# Patient Record
Sex: Female | Born: 1945
Health system: Southern US, Community
[De-identification: ages and names within clinical notes are randomized; demographics above are authoritative.]

## PROBLEM LIST (undated history)

## (undated) DIAGNOSIS — I10 Essential (primary) hypertension: Secondary | ICD-10-CM

## (undated) DIAGNOSIS — J302 Other seasonal allergic rhinitis: Secondary | ICD-10-CM

## (undated) DIAGNOSIS — M792 Neuralgia and neuritis, unspecified: Secondary | ICD-10-CM

## (undated) DIAGNOSIS — H269 Unspecified cataract: Secondary | ICD-10-CM

## (undated) DIAGNOSIS — K219 Gastro-esophageal reflux disease without esophagitis: Secondary | ICD-10-CM

## (undated) DIAGNOSIS — E785 Hyperlipidemia, unspecified: Secondary | ICD-10-CM

## (undated) DIAGNOSIS — Z8639 Personal history of other endocrine, nutritional and metabolic disease: Secondary | ICD-10-CM

## (undated) HISTORY — PX: BREAST CYST EXCISION: SHX579

## (undated) HISTORY — PX: BREAST BIOPSY: SHX20

## (undated) HISTORY — PX: CHOLECYSTECTOMY: SHX55

## (undated) HISTORY — PX: TONSILLECTOMY: SUR1361

## (undated) HISTORY — DX: Unspecified cataract: H26.9

## (undated) HISTORY — PX: CATARACT EXTRACTION W/ INTRAOCULAR LENS & ANTERIOR VITRECTOMY: SHX1308

## (undated) HISTORY — DX: Personal history of other endocrine, nutritional and metabolic disease: Z86.39

## (undated) HISTORY — PX: ABDOMINAL HYSTERECTOMY: SHX81

## (undated) HISTORY — PX: LUMBAR DISC SURGERY: SHX700

## (undated) HISTORY — PX: CATARACT EXTRACTION W/ INTRAOCULAR LENS  IMPLANT, BILATERAL: SHX1307

---

## 1998-04-08 ENCOUNTER — Other Ambulatory Visit: Admission: RE | Admit: 1998-04-08 | Discharge: 1998-04-08 | Payer: Self-pay | Admitting: Obstetrics and Gynecology

## 2004-12-14 ENCOUNTER — Encounter: Admission: RE | Admit: 2004-12-14 | Discharge: 2004-12-14 | Payer: Self-pay | Admitting: General Surgery

## 2004-12-16 ENCOUNTER — Inpatient Hospital Stay (HOSPITAL_COMMUNITY): Admission: AD | Admit: 2004-12-16 | Discharge: 2004-12-19 | Payer: Self-pay | Admitting: General Surgery

## 2006-07-19 ENCOUNTER — Inpatient Hospital Stay (HOSPITAL_COMMUNITY): Admission: RE | Admit: 2006-07-19 | Discharge: 2006-07-23 | Payer: Self-pay | Admitting: Neurosurgery

## 2009-02-03 ENCOUNTER — Ambulatory Visit (HOSPITAL_COMMUNITY): Admission: RE | Admit: 2009-02-03 | Discharge: 2009-02-03 | Payer: Self-pay | Admitting: Neurosurgery

## 2010-09-25 NOTE — H&P (Signed)
NAMEJERRIS, Cynthia Adams NO.:  [id number]   MEDICAL RECORD NO.:  [id number]          PATIENT TYPE:  INP   LOCATION:  2899                         FACILITY:  [facility]   PHYSICIAN:  Hilda Lias, M.D.   DATE OF BIRTH:  [birth year]   DATE OF ADMISSION:  07/19/2006  DATE OF DISCHARGE:                              HISTORY & PHYSICAL   This patient is a lady who I have been following for more than two years  complaining of back pain radiating to the right leg associated with  numbness which is getting worse.  Patient had had conservative treatment  including medication and she is no better.  She had been seen on several  occasions by me and there no improvement and she is getting worse.  We  know by x-ray that she has spondylolisthesis at the level of L4 and L5  with degenerative disk disease and herniated disk at the L5-S1.  Because  of these findings she wants to proceed with surgery.   PAST MEDICAL HISTORY:  1. She had tonsillectomy.  2. C-section.   ALLERGIES:  She is allergic to SULFA.   SOCIAL HISTORY:  The patient does not smoke or drink.   FAMILY HISTORY:  Mother has had cancer of the breast.  Father had high  blood pressure.   REVIEW OF SYSTEMS:  Positive for high blood pressure, cholesterol and  cholecystectomy.   PHYSICAL EXAMINATION:  HEENT:  Ears, nose, and throat normal.  NECK:  Normal.  LUNGS:  Clear.  HEART SOUNDS:  Normal.  ABDOMEN:  Normal.  EXTREMITIES:  Normal.  NEURO:  She has weak dorsiflexion of both legs.  The straight leg  raising is positive about 45 degrees.  She has decreased flexibility  with numbness on the top of the foot.  The x-rays showed that she has  had herniated disk at the level of L4-5 with spondylolisthesis and  herniated disk and degenerative disk disease at the level of L5-S1.   CLINICAL IMPRESSION:  1. L4-5 spondylolisthesis.  2. Degenerative diseases L5-S1.   RECOMMENDATIONS:  The patient should be admitted for  surgery.  We are  going to proceed with our L4-L5 diskectomy fusion with pedicle screws  and during surgery we will make a decision about the L5-S1.  It might be  that  she might require only a foraminotomy and laminotomy and diskectomy or  most likely to involve the area of the fusion.  Patient knows all the  risks including infection, CSF leak, worsening pain, paralysis, need of  further surgery and damage to the vessels of the abdomen, CSF leak,  infection.           ______________________________  Hilda Lias, M.D.     EB/MEDQ  D:  07/19/2006  T:  07/19/2006  Job:  [id number]

## 2010-09-25 NOTE — Op Note (Signed)
Cynthia Adams, Cynthia Adams                ACCOUNT NO.:  [id number]   MEDICAL RECORD NO.:  [id number]          PATIENT TYPE:  INP   LOCATION:  3172                         FACILITY:  [facility]   PHYSICIAN:  Hilda Lias, M.D.   DATE OF BIRTH:  1945/10/22   DATE OF PROCEDURE:  07/19/2006  DATE OF DISCHARGE:                               OPERATIVE REPORT   PREOPERATIVE DIAGNOSES:  1. Degenerative disk disease, L4-5, L5-S1 with chronic herniated disk,      L4-5.  2. Chronic radiculopathy.   POSTPROCEDURE DIAGNOSES:  1. Degenerative disk disease, L4-5, L5-S1 with chronic herniated disk,      L4-5.  2. Chronic radiculopathy.  3. Also L4-L5 spondylolisthesis.   PROCEDURE:  1. Bilateral L5 laminectomy and facetectomy.  2. L-4 laminectomy and facetectomy.  3. Total L4-5 diskectomy with a lysis of adhesions.  4. L5-S1 diskectomy and foraminotomy.  5. Pedicle screws, L4-L5, S1.  6. Posterior arthrodesis, L4 to S1.  7. Cell Saver C-Arm.   SURGEON:  Hilda Lias, M.D.   BRIEF HISTORY:  Cynthia Adams is a lady who had been seen in my office for  the past two years complaining of back pain, mostly with radiation down  to the right leg, although also the left leg.  X-ray showed a severe  case of degenerative disc disease at L4-5, L5-S1 with herniated disk  which was calcified in the midline going to the right side.  Surgery was  advised and the risk was explained on history and physical.   PROCEDURE IN DETAIL:  The patient was taken to the OR and she was  positioned in a prone manner.  The skin was cleaned with DuraPrep.  A  midline incision from the upper part of L4 to L5 spine was made.  The  muscle was retracted all the way laterally.  Meatal was found on the L5-  S1 nerve root and was quite degenerative.  The L4 laminar facets were  completely loose and we proceeded with removal of the spinal process of  L5 with laminar of L5, as well as the facet.  At the level of L4 facet  procedure with  removal of the facet and lamina was accomplished.  We  investigated the L4-5 disc and, indeed, the patient has quite a bit of  adhesions mostly on the left side.  Lysis was accomplished and, indeed,  the patient had a quite calcified disk.  We entered disk space using the  microblade and slowly we were able to do a total broad diskectomy.  This  procedure was done bilaterally.  There were a lot of adhesions to the  point that we had to do more part of the pedicle to be able to get this  completely removed.  There was an area of adhesions mostly in the  anterior part of the thecal sac to the floor of the spine and there was  an area where we had some CSF leak which was taken care of with some  single stitches and Tisseel.  At the level of L5, we were able to do the  total diskectomy with removal of quite a bit degenerative disk.  Then 4  cages of 10 x 22 with a BMP of BNP and autograft site were inserted  bilaterally at 4-5, 5-1.  Using the C-Arm, first in the AP and lateral  views, we found the pedicle for L4-5, S1.  We were able to introduce 6  screws of 5.5 x 40.  These screws were kept in place using ROB with cap  in place.  Investigation of the foramina showed that the nerve roots  were completely intact.  Interestingly enough, the L5 nerve root was  small in relationship to the opposite, the right side.  Then, having  good solid area, we went laterally and we removed the periosteum of the  transverse process of L4-L5 and the ala of the sacral.  A mix of  autograft and BMP was used for the arthrodesis.  The area was irrigated.  Valsalva maneuver was negative.  The wounds were closed with Vicryl and  Steri-Strips.           ______________________________  Hilda Lias, M.D.     EB/MEDQ  D:  07/19/2006  T:  07/20/2006  Job:  [id number]

## 2010-09-25 NOTE — Discharge Summary (Signed)
Cynthia Adams, Cynthia Adams                ACCOUNT NO.:  [id number]   MEDICAL RECORD NO.:  [id number]          PATIENT TYPE:  INP   LOCATION:  9318                          FACILITY:  [facility]   PHYSICIAN:  Gita Kudo, M.D. DATE OF BIRTH:  06/23/1945   DATE OF ADMISSION:  12/16/2004  DATE OF DISCHARGE:  12/19/2004                                 DISCHARGE SUMMARY   PROGRESS NOTE   CHIEF COMPLAINT:  Gallstones.   HISTORY OF PRESENT ILLNESS:  This 58-year lady was brought to the [facility] on December 15, 2004, for elective cholecystectomy. She  had had bouts of abdominal pain but was not really complaining of severe  pain or problems. Her white count was modestly elevated. A laparoscopic  cholecystectomy was attempted but, because of her severe inflammation and  erythema, I felt it unsafe to proceed and therefore converted her to an open  procedure. This went well and the following morning, since she could not  stay there as an inpatient, she was transferred to [facility] for  postoperative care.   HOSPITAL COURSE:  At [facility], she was comfortable, active and had  no problems. She had a little fever and this gradually went away with good  pulmonary toilet. Her Jackson-Pratt drain was not producing much fluid. She  was maintained on Cefotan b.i.d.  She was ambulatory, tolerating a diet and  improving to the point that she could be discharged and was discharged on  December 19, 2004.  Her staples and drain was removed. She was instructed in  wound care and follow-up in the office.   LABORATORY STUDIES:  Pathology:  Acute gangrenous cholecystitis.  Follow-up  hemoglobin was 8.1. It had dropped as low as 7.9 on two occasions. She did  not get transfused. Her white count dropped down from 18,000 to 13,000.   She was given prescriptions for Vicodin as well as iron sulfate. She will be  followed in the office as an outpatient.   DISCHARGE DIAGNOSIS:  Gangrenous  cholecystitis.   OPERATIONS:  Open cholecystectomy - [facility], December 15, 2004.   COMPLICATIONS:  Postoperative anemia.   CONDITION AT DISCHARGE:  Good.       MRL/MEDQ  D:  12/19/2004  T:  12/19/2004  Job:  [id number]   cc:   Al Decant. Janey Greaser, MD  [address]  [place]  [place] [address]  Fax: [phone]

## 2014-02-07 LAB — HM MAMMOGRAPHY

## 2014-05-06 ENCOUNTER — Encounter: Payer: Self-pay | Admitting: *Deleted

## 2014-05-06 ENCOUNTER — Emergency Department (INDEPENDENT_AMBULATORY_CARE_PROVIDER_SITE_OTHER)
Admission: EM | Admit: 2014-05-06 | Discharge: 2014-05-06 | Disposition: A | Discharge status: Home / Self Care | Payer: Medicare Other | Source: Home / Self Care

## 2014-05-06 DIAGNOSIS — B029 Zoster without complications: Secondary | ICD-10-CM

## 2014-05-06 HISTORY — DX: Neuralgia and neuritis, unspecified: M79.2

## 2014-05-06 HISTORY — DX: Hyperlipidemia, unspecified: E78.5

## 2014-05-06 HISTORY — DX: Other seasonal allergic rhinitis: J30.2

## 2014-05-06 HISTORY — DX: Gastro-esophageal reflux disease without esophagitis: K21.9

## 2014-05-06 HISTORY — DX: Essential (primary) hypertension: I10

## 2014-05-06 MED ORDER — VALACYCLOVIR HCL 1 G PO TABS: 1000.0000 mg | ORAL_TABLET | Freq: Three times a day (TID) | ORAL | Status: DC

## 2014-05-06 MED ORDER — PREDNISONE 50 MG PO TABS: ORAL_TABLET | ORAL | Status: DC

## 2014-05-06 MED ORDER — METHYLPREDNISOLONE SODIUM SUCC 125 MG IJ SOLR
125.0000 mg | Freq: Once | INTRAMUSCULAR | Status: AC
  Administered 2014-05-06: 125 mg via INTRAMUSCULAR

## 2014-05-06 NOTE — ED Notes (Signed)
Pt c/o painful, itching rash on her RT upper back x 6 days.

## 2014-05-06 NOTE — Discharge Instructions (Signed)

## 2014-05-06 NOTE — ED Provider Notes (Signed)
CSN: [id number]     Arrival date & time 05/06/14  1252 History   None    Chief Complaint  Patient presents with  . Rash    HPI  HPI  This patient complains of a RASH  Location: R back/lateral rib cage   Onset: 2-3 days   Course: mild itching and erythema, now tingling and burning around dermatome radiating into R breast   Self-treated with: nothing   Improvement with treatment: n/a  History  Itching: yes  Tenderness: yes  New medications/antibiotics: no  Pet exposure: no  Recent travel or tropical exposure: no  New soaps, shampoos, detergent, clothing: no  Tick/insect exposure: no  Chemical Exposure: no  Red Flags  Feeling ill: no  Fever: no  Facial/tongue swelling/difficulty breathing: no  Diabetic or immunocompromised: no    Past Medical History  Diagnosis Date  . Hypertension   . Hyperlipidemia   . GERD (gastroesophageal reflux disease)   . Seasonal allergies   . Nerve pain    Past Surgical History  Procedure Laterality Date  . Tonsillectomy    . Abdominal hysterectomy    . Cesarean section    . Lumbar disc surgery     Family History  Problem Relation Age of Onset  . Cancer Mother     breast  . Dementia Mother   . Hypertension Mother   . Hyperlipidemia Mother    History  Substance Use Topics  . Smoking status: Never Smoker   . Smokeless tobacco: Not on file  . Alcohol Use: No   OB History    No data available     Review of Systems  All other systems reviewed and are negative.   Allergies  Minocycline and Sulfa antibiotics  Home Medications   Prior to Admission medications   Medication Sig Start Date End Date Taking? Authorizing Provider  aspirin 81 MG tablet Take 81 mg by mouth daily.   Yes Historical Provider, MD  Cyanocobalamin (B-12 PO) Take by mouth.   Yes Historical Provider, MD  estradiol (CLIMARA - DOSED IN MG/24 HR) 0.05 mg/24hr patch Place 0.05 mg onto the skin once a week.   Yes Historical Provider, MD  fluticasone (FLONASE)  50 MCG/ACT nasal spray Place into both nostrils daily.   Yes Historical Provider, MD  gabapentin (NEURONTIN) 600 MG tablet Take 600 mg by mouth 3 (three) times daily.   Yes Historical Provider, MD  gemfibrozil (LOPID) 600 MG tablet Take 600 mg by mouth 2 (two) times daily before a meal.   Yes Historical Provider, MD  HYDROcodone-acetaminophen (NORCO) 10-325 MG per tablet Take 1 tablet by mouth every 6 (six) hours as needed.   Yes Historical Provider, MD  loratadine (CLARITIN) 10 MG tablet Take 10 mg by mouth daily.   Yes Historical Provider, MD  metoprolol succinate (TOPROL-XL) 100 MG 24 hr tablet Take 100 mg by mouth daily. Take with or immediately following a meal.   Yes Historical Provider, MD  MULTIPLE VITAMIN PO Take by mouth.   Yes Historical Provider, MD  omeprazole (PRILOSEC) 20 MG capsule Take 20 mg by mouth daily.   Yes Historical Provider, MD  predniSONE (DELTASONE) 50 MG tablet 1 tab daily x 5 days 05/06/14   Shanda Howells, MD  valACYclovir (VALTREX) 1000 MG tablet Take 1 tablet (1,000 mg total) by mouth 3 (three) times daily. 05/06/14   Shanda Howells, MD   BP 121/70 mmHg  Pulse 60  Temp(Src) 98.1 F (36.7 C) (Oral)  Resp 18  Ht 5\' 3"  (1.6 m)  Wt 157 lb (71.215 kg)  BMI 27.82 kg/m2  SpO2 98% Physical Exam  Constitutional: She appears well-developed and well-nourished.  HENT:  Head: Normocephalic and atraumatic.  Eyes: Conjunctivae are normal. Pupils are equal, round, and reactive to light.  Neck: Normal range of motion.  Cardiovascular: Normal rate and regular rhythm.   Pulmonary/Chest: Effort normal.  Abdominal: Soft.  Musculoskeletal: Normal range of motion.  Skin: Rash noted.    ED Course  Procedures (including critical care time) Labs Review Labs Reviewed - No data to display  Imaging Review No results found.   MDM   1. Herpes zoster    Exam consistent w/ shingles  Solumedrol 125mg  IM x1 Will place on extended course of valtrex and prednisone    Discussed general care and systemic red flags  Follow up as needed    The patient and/or caregiver has been counseled thoroughly with regard to treatment plan and/or medications prescribed including dosage, schedule, interactions, rationale for use, and possible side effects and they verbalize understanding. Diagnoses and expected course of recovery discussed and will return if not improved as expected or if the condition worsens. Patient and/or caregiver verbalized understanding.         Shanda Howells, MD 05/06/14 [phone]

## 2014-05-13 ENCOUNTER — Encounter: Payer: Self-pay | Admitting: Family Medicine

## 2014-05-13 ENCOUNTER — Ambulatory Visit (INDEPENDENT_AMBULATORY_CARE_PROVIDER_SITE_OTHER): Payer: Medicare Other | Admitting: Family Medicine

## 2014-05-13 VITALS — BP 152/77 | HR 69 | Ht 63.5 in | Wt 155.0 lb

## 2014-05-13 DIAGNOSIS — B029 Zoster without complications: Secondary | ICD-10-CM | POA: Diagnosis not present

## 2014-05-13 DIAGNOSIS — I10 Essential (primary) hypertension: Secondary | ICD-10-CM

## 2014-05-13 DIAGNOSIS — M8580 Other specified disorders of bone density and structure, unspecified site: Secondary | ICD-10-CM | POA: Insufficient documentation

## 2014-05-13 DIAGNOSIS — R7309 Other abnormal glucose: Secondary | ICD-10-CM | POA: Diagnosis not present

## 2014-05-13 DIAGNOSIS — R7303 Prediabetes: Secondary | ICD-10-CM

## 2014-05-13 DIAGNOSIS — E785 Hyperlipidemia, unspecified: Secondary | ICD-10-CM | POA: Diagnosis not present

## 2014-05-13 DIAGNOSIS — K219 Gastro-esophageal reflux disease without esophagitis: Secondary | ICD-10-CM | POA: Insufficient documentation

## 2014-05-13 NOTE — Progress Notes (Addendum)
CC: Cynthia Adams is a 69 y.o. female is here for Establish Care   Subjective: HPI:  Very pleasant 69 year old here to establish care  Patient reports a history of shingles that occurred right before Christmas. About 3-4 days before the 25th she started to notice itching on her right shoulder blade that wrapped around to the breast. On Christmas day she noticed that it was also having a painful component to the itch, the next day she noticed what she describes as mosquito bites at the sites of discomfort. Within the next day it formed blisters and she was seen at a local urgent care office. She was given a Solu-Medrol injection, 1 week of prednisone, and 3 times a day Valtrex. She reports that symptoms are slightly better and definitely no worse. She states that the pain is currently mild to moderate in severity worse with touching the skin. Pain is annoying but not bothering her quality of life. She denies any new skin changes and only noticed the blisters on her right back near the shoulder blades.  Reports a history of essential hypertension currently taking metoprolol 100 mg twice a day. No outside blood pressures to report however on chart review her blood pressure has been normotensive at least since October 2015.   reports a history of hyperlipidemia with cholesterol most recently checked in October 2015. LDL cholesterol 102, total cholesterol 170, triglycerides 130. No history of cardiac disease  On chart review she has a history of osteopenia currently taking vitamin D and calcium supplementation  History of prediabetes that stems back to at least until 2014. Her most recent A1c was in September 2015 returned at 5.9. No outside blood sugars report other than this. Has never been on anti-hyperglycemic medication.  Review of Systems - General ROS: negative for - chills, fever, night sweats, weight gain or weight loss Ophthalmic ROS: negative for - decreased vision Psychological ROS:  negative for - anxiety or depression ENT ROS: negative for - hearing change, nasal congestion, tinnitus or allergies Hematological and Lymphatic ROS: negative for - bleeding problems, bruising or swollen lymph nodes Breast ROS: negative Respiratory ROS: no cough, shortness of breath, or wheezing Cardiovascular ROS: no chest pain or dyspnea on exertion Gastrointestinal ROS: no abdominal pain, change in bowel habits, or black or bloody stools Genito-Urinary ROS: negative for - genital discharge, genital ulcers, incontinence or abnormal bleeding from genitals Musculoskeletal ROS: negative for - joint pain or muscle pain Neurological ROS: negative for - headaches or memory loss Dermatological ROS: negative for lumps, mole changes, rash and skin lesion changes other than that described above  Past Medical History  Diagnosis Date  . Hypertension   . Hyperlipidemia   . GERD (gastroesophageal reflux disease)   . Seasonal allergies   . Nerve pain     Past Surgical History  Procedure Laterality Date  . Tonsillectomy    . Abdominal hysterectomy    . Cesarean section    . Lumbar disc surgery     Family History  Problem Relation Age of Onset  . Cancer Mother     breast  . Dementia Mother   . Hypertension Mother   . Hyperlipidemia Mother     History   Social History  . Marital Status: Married    Spouse Name: N/A    Number of Children: N/A  . Years of Education: N/A   Occupational History  . Not on file.   Social History Main Topics  . Smoking status: Never Smoker   .  Smokeless tobacco: Not on file  . Alcohol Use: No  . Drug Use: No  . Sexual Activity: Not on file   Other Topics Concern  . Not on file   Social History Narrative     Objective: BP 152/77 mmHg  Pulse 69  Ht 5' 3.5" (1.613 m)  Wt 155 lb (70.308 kg)  BMI 27.02 kg/m2  General: Alert and Oriented, No Acute Distress HEENT: Pupils equal, round, reactive to light. Conjunctivae clear.  Moistness membranes  pharynx unremarkable Lungs: Clear to auscultation bilaterally, no wheezing/ronchi/rales.  Comfortable work of breathing. Good air movement. Cardiac: Regular rate and rhythm. Normal S1/S2.  No murmurs, rubs, nor gallops.   Extremities: No peripheral edema.  Strong peripheral pulses.  Mental Status: No depression, anxiety, nor agitation. Skin: Warm and dry.just medially and laterally to theright scapular region there are clusters of well healing shallow ulcerations with overlying scab and no signs of bacterial infection.  Assessment & Plan: Jacob was seen today for establish care.  Diagnoses and associated orders for this visit:  Essential hypertension  Hyperlipidemia  Osteopenia  Prediabetes  Shingles outbreak     essential hypertension: Uncontrolled discussed salt restriction and increasing physical activity, since she's been normotensive in the  Past no change  To her antihypertensive regimen at this time  hyperlipidemia: Controlled continue gemfibrozil  Osteopenia: Control continued calcium and vitamin D supple mentation  prediabetes:  Due for recheck in the next 2-3 months.  Clinically this is currently controlled  Shingles:  Reassurance provided that this is appears  To be slowly improving as anticipated. Discussed that it may take up to 3 months for her pain to go completely away. Discussed increasing gabapentin  His pain begins to get in the way of her quality of life  However for now she is at peace knowing that  This is a typical behavior for shingles outbreak  And declines any adjustments to her pain medication.  Return in about 3 months (around 08/12/2014) for Complete Physical Exam.

## 2014-06-13 ENCOUNTER — Encounter: Payer: Self-pay | Admitting: Family Medicine

## 2014-06-13 DIAGNOSIS — Z8639 Personal history of other endocrine, nutritional and metabolic disease: Secondary | ICD-10-CM

## 2014-06-13 DIAGNOSIS — N281 Cyst of kidney, acquired: Secondary | ICD-10-CM | POA: Insufficient documentation

## 2014-06-13 DIAGNOSIS — M48061 Spinal stenosis, lumbar region without neurogenic claudication: Secondary | ICD-10-CM | POA: Insufficient documentation

## 2014-06-13 DIAGNOSIS — N6002 Solitary cyst of left breast: Secondary | ICD-10-CM | POA: Insufficient documentation

## 2014-06-13 HISTORY — DX: Personal history of other endocrine, nutritional and metabolic disease: Z86.39

## 2014-08-14 ENCOUNTER — Encounter: Payer: Medicare Other | Admitting: Family Medicine

## 2014-08-16 ENCOUNTER — Ambulatory Visit (INDEPENDENT_AMBULATORY_CARE_PROVIDER_SITE_OTHER): Payer: Medicare Other | Admitting: Family Medicine

## 2014-08-16 ENCOUNTER — Encounter: Payer: Self-pay | Admitting: Family Medicine

## 2014-08-16 VITALS — BP 118/65 | HR 63 | Ht 63.5 in | Wt 155.0 lb

## 2014-08-16 DIAGNOSIS — Z79899 Other long term (current) drug therapy: Secondary | ICD-10-CM | POA: Diagnosis not present

## 2014-08-16 DIAGNOSIS — Z23 Encounter for immunization: Secondary | ICD-10-CM

## 2014-08-16 DIAGNOSIS — Z418 Encounter for other procedures for purposes other than remedying health state: Secondary | ICD-10-CM | POA: Diagnosis not present

## 2014-08-16 DIAGNOSIS — Z299 Encounter for prophylactic measures, unspecified: Secondary | ICD-10-CM

## 2014-08-16 DIAGNOSIS — Z0000 Encounter for general adult medical examination without abnormal findings: Secondary | ICD-10-CM

## 2014-08-16 LAB — COMPLETE METABOLIC PANEL WITH GFR
ALK PHOS: 88 U/L (ref 39–117)
ALT: 22 U/L (ref 0–35)
AST: 28 U/L (ref 0–37)
Albumin: 4.9 g/dL (ref 3.5–5.2)
BILIRUBIN TOTAL: 0.5 mg/dL (ref 0.2–1.2)
BUN: 17 mg/dL (ref 6–23)
CO2: 26 mEq/L (ref 19–32)
CREATININE: 0.91 mg/dL (ref 0.50–1.10)
Calcium: 9.8 mg/dL (ref 8.4–10.5)
Chloride: 101 mEq/L (ref 96–112)
GFR, Est African American: 75 mL/min
GFR, Est Non African American: 65 mL/min
Glucose, Bld: 103 mg/dL — ABNORMAL HIGH (ref 70–99)
Potassium: 4.9 mEq/L (ref 3.5–5.3)
Sodium: 140 mEq/L (ref 135–145)
Total Protein: 7.7 g/dL (ref 6.0–8.3)

## 2014-08-16 LAB — LIPID PANEL
CHOL/HDL RATIO: 5.2 ratio
Cholesterol: 172 mg/dL (ref 0–200)
HDL: 33 mg/dL — ABNORMAL LOW (ref >=46)
LDL Cholesterol: 101 mg/dL — ABNORMAL HIGH (ref 0–99)
Triglycerides: 188 mg/dL — ABNORMAL HIGH (ref <150)
VLDL: 38 mg/dL (ref 0–40)

## 2014-08-16 LAB — CBC
HCT: 37 % (ref 36.0–46.0)
HEMOGLOBIN: 12.5 g/dL (ref 12.0–15.0)
MCH: 30.8 pg (ref 26.0–34.0)
MCHC: 33.8 g/dL (ref 30.0–36.0)
MCV: 91.1 fL (ref 78.0–100.0)
MPV: 11.5 fL (ref 8.6–12.4)
Platelets: 353 10*3/uL (ref 150–400)
RBC: 4.06 MIL/uL (ref 3.87–5.11)
RDW: 13.8 % (ref 11.5–15.5)
WBC: 9.1 10*3/uL (ref 4.0–10.5)

## 2014-08-16 MED ORDER — PANTOPRAZOLE SODIUM 40 MG PO TBEC: 40.0000 mg | DELAYED_RELEASE_TABLET | Freq: Every day | ORAL | Status: DC

## 2014-08-16 NOTE — Patient Instructions (Signed)
Dr. Sarvesh Meddaugh's General Advice Following Your Complete Physical Exam  The Benefits of Regular Exercise: Unless you suffer from an uncontrolled cardiovascular condition, studies strongly suggest that regular exercise and physical activity will add to both the quality and length of your life.  The World Health Organization recommends 150 minutes of moderate intensity aerobic activity every week.  This is best split over 3-4 days a week, and can be as simple as a brisk walk for just over 35 minutes "most days of the week".  This type of exercise has been shown to lower LDL-Cholesterol, lower average blood sugars, lower blood pressure, lower cardiovascular disease risk, improve memory, and increase one's overall sense of wellbeing.  The addition of anaerobic (or "strength training") exercises offers additional benefits including but not limited to increased metabolism, prevention of osteoporosis, and improved overall cholesterol levels.  How Can I Strive For A Low-Fat Diet?: Current guidelines recommend that 25-35 percent of your daily energy (food) intake should come from fats.  One might ask how can this be achieved without having to dissect each meal on a daily basis?  Switch to skim or 1% milk instead of whole milk.  Focus on lean meats such as ground turkey, fresh fish, baked chicken, and lean cuts of beef as your source of dietary protein.  Limit saturated fat consumption to less than 10% of your daily caloric intake.  Limit trans fatty acid consumption primarily by limiting synthetic trans fats such as partially hydrogenated oils (Ex: fried fast foods).  Substitute olive or vegetable oil for solid fats where possible.  Moderation of Salt Intake: Provided you don't carry a diagnosis of congestive heart failure nor renal failure, I recommend a daily allowance of no more than 2300 mg of salt (sodium).  Keeping under this daily goal is associated with a decreased risk of cardiovascular events, creeping  above it can lead to elevated blood pressures and increases your risk of cardiovascular events.  Milligrams (mg) of salt is listed on all nutrition labels, and your daily intake can add up faster than you think.  Most canned and frozen dinners can pack in over half your daily salt allowance in one meal.    Lifestyle Health Risks: Certain lifestyle choices carry specific health risks.  As you may already know, tobacco use has been associated with increasing one's risk of cardiovascular disease, pulmonary disease, numerous cancers, among many other issues.  What you may not know is that there are medications and nicotine replacement strategies that can more than double your chances of successfully quitting.  I would be thrilled to help manage your quitting strategy if you currently use tobacco products.  When it comes to alcohol use, I've yet to find an "ideal" daily allowance.  Provided an individual does not have a medical condition that is exacerbated by alcohol consumption, general guidelines determine "safe drinking" as no more than two standard drinks for a man or no more than one standard drink for a female per day.  However, much debate still exists on whether any amount of alcohol consumption is technically "safe".  My general advice, keep alcohol consumption to a minimum for general health promotion.  If you or others believe that alcohol, tobacco, or recreational drug use is interfering with your life, I would be happy to provide confidential counseling regarding treatment options.  General "Over The Counter" Nutrition Advice: Postmenopausal women should aim for a daily calcium intake of 1200 mg, however a significant portion of this might already be   provided by diets including milk, yogurt, cheese, and other dairy products.  Vitamin D has been shown to help preserve bone density, prevent fatigue, and has even been shown to help reduce falls in the elderly.  Ensuring a daily intake of 800 Units of  Vitamin D is a good place to start to enjoy the above benefits, we can easily check your Vitamin D level to see if you'd potentially benefit from supplementation beyond 800 Units a day.  Folic Acid intake should be of particular concern to women of childbearing age.  Daily consumption of 400-800 mcg of Folic Acid is recommended to minimize the chance of spinal cord defects in a fetus should pregnancy occur.    For many adults, accidents still remain one of the most common culprits when it comes to cause of death.  Some of the simplest but most effective preventitive habits you can adopt include regular seatbelt use, proper helmet use, securing firearms, and regularly testing your smoke and carbon monoxide detectors.  Cynthia Lady B. Eustacio Ellen DO [facility] [address], [address] [place], [place] [address] Phone: [phone]  

## 2014-08-16 NOTE — Progress Notes (Signed)
Subjective:    Cynthia Adams is a 69 y.o. female who presents for Medicare Annual/Subsequent preventive examination.  Preventive Screening-Counseling & Management  Tobacco History  Smoking status  . Never Smoker   Smokeless tobacco  . Not on file     Colonoscopy: done 06/2007 UTD Papsmear: No longer checking per patient, no abnormals Mammogram: Normal 02/2014, repeat this october  DEXA: normal 02/2013, repeat October this year  Influenza Vaccine: out of season Pneumovax: UTD Td/Tdap: due for Tdap, will receive today Zoster: not indicated given recent outbreak     Problems Prior to Visit 1. HTN  Current Problems (verified) Patient Active Problem List   Diagnosis Date Noted  . Cyst of left breast 06/13/2014  . Spinal stenosis of lumbar region 06/13/2014  . Bilateral renal cysts 06/13/2014  . Anemia, iron deficiency 06/13/2014  . Essential hypertension 05/13/2014  . Hyperlipidemia 05/13/2014  . Osteopenia 05/13/2014  . GERD (gastroesophageal reflux disease) 05/13/2014  . Prediabetes 05/13/2014  . Shingles outbreak 05/13/2014    Medications Prior to Visit Current Outpatient Prescriptions on File Prior to Visit  Medication Sig Dispense Refill  . aspirin 81 MG tablet Take 81 mg by mouth daily.    . Cyanocobalamin (B-12 PO) Take by mouth.    . estradiol (ESTRACE) 0.5 MG tablet Take 0.5 mg by mouth daily. Take 1/2 tablet daily    . fluticasone (FLONASE) 50 MCG/ACT nasal spray Place into both nostrils daily.    Marland Kitchen gabapentin (NEURONTIN) 600 MG tablet Take 600 mg by mouth 3 (three) times daily.    Marland Kitchen gemfibrozil (LOPID) 600 MG tablet Take 600 mg by mouth 2 (two) times daily before a meal.    . HYDROcodone-acetaminophen (NORCO) 10-325 MG per tablet Take 1 tablet by mouth every 6 (six) hours as needed.    . loratadine (CLARITIN) 10 MG tablet Take 10 mg by mouth daily.    . metoprolol (LOPRESSOR) 100 MG tablet Take 1 tablet (100 mg total) by mouth 2 (two) times daily. 180  tablet 3  . MULTIPLE VITAMIN PO Take by mouth.    Marland Kitchen omeprazole (PRILOSEC) 20 MG capsule Take 20 mg by mouth daily.    . valACYclovir (VALTREX) 1000 MG tablet Take 1 tablet (1,000 mg total) by mouth 3 (three) times daily. 42 tablet 0   No current facility-administered medications on file prior to visit.    Current Medications (verified) Current Outpatient Prescriptions  Medication Sig Dispense Refill  . aspirin 81 MG tablet Take 81 mg by mouth daily.    . Cyanocobalamin (B-12 PO) Take by mouth.    . estradiol (ESTRACE) 0.5 MG tablet Take 0.5 mg by mouth daily. Take 1/2 tablet daily    . fluticasone (FLONASE) 50 MCG/ACT nasal spray Place into both nostrils daily.    Marland Kitchen gabapentin (NEURONTIN) 600 MG tablet Take 600 mg by mouth 3 (three) times daily.    Marland Kitchen gemfibrozil (LOPID) 600 MG tablet Take 600 mg by mouth 2 (two) times daily before a meal.    . HYDROcodone-acetaminophen (NORCO) 10-325 MG per tablet Take 1 tablet by mouth every 6 (six) hours as needed.    . loratadine (CLARITIN) 10 MG tablet Take 10 mg by mouth daily.    . metoprolol (LOPRESSOR) 100 MG tablet Take 1 tablet (100 mg total) by mouth 2 (two) times daily. 180 tablet 3  . MULTIPLE VITAMIN PO Take by mouth.    Marland Kitchen omeprazole (PRILOSEC) 20 MG capsule Take 20 mg by mouth daily.    Marland Kitchen  valACYclovir (VALTREX) 1000 MG tablet Take 1 tablet (1,000 mg total) by mouth 3 (three) times daily. 42 tablet 0   No current facility-administered medications for this visit.     Allergies (verified) Minocycline and Sulfa antibiotics   PAST HISTORY  Family History Family History  Problem Relation Age of Onset  . Cancer Mother     breast  . Dementia Mother   . Hypertension Mother   . Hyperlipidemia Mother     Social History History  Substance Use Topics  . Smoking status: Never Smoker   . Smokeless tobacco: Not on file  . Alcohol Use: No     Are there smokers in your home (other than you)? No  Risk Factors Current exercise habits:  The patient does not participate in regular exercise at present.  Dietary issues discussed: dash   Cardiac risk factors: advanced age (older than 12 for men, 78 for women), dyslipidemia and hypertension.  Depression Screen (Note: if answer to either of the following is "Yes", a more complete depression screening is indicated)   Over the past two weeks, have you felt down, depressed or hopeless? No  Over the past two weeks, have you felt little interest or pleasure in doing things? No  Have you lost interest or pleasure in daily life? No  Do you often feel hopeless? No  Do you cry easily over simple problems? No  Activities of Daily Living In your present state of health, do you have any difficulty performing the following activities?:  Driving? No Managing money?  No Feeding yourself? No Getting from bed to chair? No Climbing a flight of stairs? No Preparing food and eating?: No Bathing or showering? No Getting dressed: No Getting to the toilet? No Using the toilet:No Moving around from place to place: No In the past year have you fallen or had a near fall?:No   Are you sexually active?  No  Do you have more than one partner?  No  Hearing Difficulties: No Do you often ask people to speak up or repeat themselves? No Do you experience ringing or noises in your ears? No Do you have difficulty understanding soft or whispered voices? No   Do you feel that you have a problem with memory? No  Do you often misplace items? No  Do you feel safe at home?  No  Cognitive Testing  Alert? Yes  Normal Appearance?Yes  Oriented to person? Yes  Place? Yes   Time? Yes  Recall of three objects?  Yes  Can perform simple calculations? Yes  Displays appropriate judgment?Yes  Can read the correct time from a watch face?Yes   Advanced Directives have been discussed with the patient? Yes  List the Names of Other Physician/Practitioners you currently use: 1.    Indicate any recent Medical  Services you may have received from other than [facility] providers in the past year (date may be approximate).  Immunization History  Administered Date(s) Administered  . Pneumococcal Polysaccharide-23 05/08/2006  . Td 05/10/2004    Screening Tests Health Maintenance  Topic Date Due  . ZOSTAVAX  03/27/2006  . PNA vac Low Risk Adult (1 of 2 - PCV13) 03/28/2011  . TETANUS/TDAP  05/10/2014  . INFLUENZA VACCINE  12/09/2014  . MAMMOGRAM  03/07/2016  . COLONOSCOPY  06/10/2017  . DEXA SCAN  Completed    All answers were reviewed with the patient and necessary referrals were made:  Marcial Pacas, DO   08/16/2014   History reviewed: allergies, current  medications, past family history, past medical history, past social history, past surgical history and problem list  Review of Systems  Review of Systems - General ROS: negative for - chills, fever, night sweats, weight gain or weight loss Ophthalmic ROS: negative for - decreased vision Psychological ROS: negative for - anxiety or depression ENT ROS: negative for - hearing change, nasal congestion, tinnitus or allergies Hematological and Lymphatic ROS: negative for - bleeding problems, bruising or swollen lymph nodes Breast ROS: negative Respiratory ROS: no cough, shortness of breath, or wheezing Cardiovascular ROS: no chest pain or dyspnea on exertion Gastrointestinal ROS: no abdominal pain, change in bowel habits, or black or bloody stools Genito-Urinary ROS: negative for - genital discharge, genital ulcers, incontinence or abnormal bleeding from genitals Musculoskeletal ROS: negative for - joint pain or muscle pain Neurological ROS: negative for - headaches or memory loss Dermatological ROS: negative for lumps, mole changes, rash and skin lesion changes  Objective:     Vision by Snellen chart: right eye:20/25, left eye:20/25  There is no weight on file to calculate BMI. There were no vitals taken for this visit.  General: No Acute  Distress HEENT: Atraumatic, normocephalic, conjunctivae normal without scleral icterus.  No nasal discharge, hearing grossly intact, TMs with good landmarks bilaterally with no middle ear abnormalities, posterior pharynx clear without oral lesions. Neck: Supple, trachea midline, no cervical nor supraclavicular adenopathy. Pulmonary: Clear to auscultation bilaterally without wheezing, rhonchi, nor rales. Cardiac: Regular rate and rhythm.  No murmurs, rubs, nor gallops. No peripheral edema.  2+ peripheral pulses bilaterally. Abdomen: Bowel sounds normal.  No masses.  Non-tender without rebound.  Negative Murphy's sign. MSK: Grossly intact, no signs of weakness.  Full strength throughout upper and lower extremities.  Full ROM in upper and lower extremities.  No midline spinal tenderness. Neuro: Gait unremarkable, CN II-XII grossly intact.  C5-C6 Reflex 2/4 Bilaterally, L4 Reflex 2/4 Bilaterally.  Cerebellar function intact. Skin: No rashes. Psych: Alert and oriented to person/place/time.  Thought process normal. No anxiety/depression.      Assessment:     Normal adult exam      Plan:     During the course of the visit the patient was educated and counseled about appropriate screening and preventive services including:    Screening mammography   Zostavax in this Fall  Diet review for nutrition referral?  Not Indicated ____   Patient Instructions (the written plan) was given to the patient.  Medicare Attestation I have personally reviewed: The patient's medical and social history Their use of alcohol, tobacco or illicit drugs Their current medications and supplements The patient's functional ability including ADLs,fall risks, home safety risks, cognitive, and hearing and visual impairment Diet and physical activities Evidence for depression or mood disorders  The patient's weight, height, BMI, and visual acuity have been recorded in the chart.  I have made referrals, counseling,  and provided education to the patient based on review of the above and I have provided the patient with a written personalized care plan for preventive services.     Marcial Pacas, DO   08/16/2014

## 2014-08-17 LAB — HEMOGLOBIN A1C
HEMOGLOBIN A1C: 6.2 % — AB (ref <5.7)
MEAN PLASMA GLUCOSE: 131 mg/dL — AB (ref <117)

## 2014-08-19 ENCOUNTER — Telehealth: Payer: Self-pay

## 2014-08-19 NOTE — Telephone Encounter (Signed)
Updated medication list

## 2014-09-10 ENCOUNTER — Other Ambulatory Visit: Payer: Self-pay | Admitting: Family Medicine

## 2014-09-10 MED ORDER — PANTOPRAZOLE SODIUM 40 MG PO TBEC: 40.0000 mg | DELAYED_RELEASE_TABLET | Freq: Every day | ORAL | Status: DC

## 2014-09-19 ENCOUNTER — Other Ambulatory Visit: Payer: Self-pay | Admitting: Family Medicine

## 2014-09-19 MED ORDER — METOPROLOL TARTRATE 100 MG PO TABS: 50.0000 mg | ORAL_TABLET | Freq: Two times a day (BID) | ORAL | Status: DC

## 2014-09-19 NOTE — Telephone Encounter (Signed)
Requested 90 day supply be sent to [facility].

## 2014-09-23 DIAGNOSIS — Z0100 Encounter for examination of eyes and vision without abnormal findings: Secondary | ICD-10-CM | POA: Diagnosis not present

## 2014-10-03 DIAGNOSIS — K648 Other hemorrhoids: Secondary | ICD-10-CM | POA: Diagnosis not present

## 2014-10-03 DIAGNOSIS — Z1211 Encounter for screening for malignant neoplasm of colon: Secondary | ICD-10-CM | POA: Diagnosis not present

## 2014-10-03 DIAGNOSIS — K5730 Diverticulosis of large intestine without perforation or abscess without bleeding: Secondary | ICD-10-CM | POA: Diagnosis not present

## 2014-10-03 LAB — HM COLONOSCOPY

## 2014-10-14 ENCOUNTER — Other Ambulatory Visit: Payer: Self-pay | Admitting: Family Medicine

## 2014-10-14 MED ORDER — ESTRADIOL 0.5 MG PO TABS: 0.5000 mg | ORAL_TABLET | Freq: Every day | ORAL | Status: DC

## 2014-10-14 NOTE — Telephone Encounter (Signed)
Kelsi, Can you call patient to confirm what dose she's actually taking the historical Rx was put in as both a full tablet and a half tablet daily?

## 2014-10-14 NOTE — Telephone Encounter (Signed)
Patient reports taking 0.5 tab daily, but her old provider would put the quantity as if she was taking 1 tablet daily so the Rx would last 6 months rather than just 90 days. Pt states that saved her money regarding co-pays. Would like to continue having this Rx sent to her mail order pharmacy.

## 2014-10-14 NOTE — Telephone Encounter (Signed)
Pt requesting refill on Rx. This was last entered by a historical provider. Will route to PCP for approval.

## 2014-10-14 NOTE — Telephone Encounter (Signed)
Understood, new Rx sent to [facility].

## 2014-10-30 ENCOUNTER — Encounter: Payer: Self-pay | Admitting: *Deleted

## 2014-11-15 ENCOUNTER — Ambulatory Visit: Payer: Medicare Other | Admitting: Family Medicine

## 2014-11-20 DIAGNOSIS — G5601 Carpal tunnel syndrome, right upper limb: Secondary | ICD-10-CM | POA: Diagnosis not present

## 2014-11-20 DIAGNOSIS — G603 Idiopathic progressive neuropathy: Secondary | ICD-10-CM | POA: Diagnosis not present

## 2014-11-20 DIAGNOSIS — M5412 Radiculopathy, cervical region: Secondary | ICD-10-CM | POA: Diagnosis not present

## 2014-11-20 DIAGNOSIS — M5417 Radiculopathy, lumbosacral region: Secondary | ICD-10-CM | POA: Diagnosis not present

## 2014-11-21 ENCOUNTER — Encounter: Payer: Self-pay | Admitting: Family Medicine

## 2014-11-21 ENCOUNTER — Ambulatory Visit (INDEPENDENT_AMBULATORY_CARE_PROVIDER_SITE_OTHER): Payer: Medicare Other | Admitting: Family Medicine

## 2014-11-21 VITALS — BP 130/71 | HR 67 | Ht 63.0 in | Wt 157.0 lb

## 2014-11-21 DIAGNOSIS — I10 Essential (primary) hypertension: Secondary | ICD-10-CM

## 2014-11-21 DIAGNOSIS — R7309 Other abnormal glucose: Secondary | ICD-10-CM | POA: Diagnosis not present

## 2014-11-21 DIAGNOSIS — M25511 Pain in right shoulder: Secondary | ICD-10-CM

## 2014-11-21 DIAGNOSIS — R7303 Prediabetes: Secondary | ICD-10-CM

## 2014-11-21 NOTE — Progress Notes (Signed)
CC: Cynthia Adams is a 69 y.o. female is here for Follow-up   Subjective: HPI:  Follow-up prediabetes: Admits that she's having trouble with cutting back on Pepsi intake, she is also drinking about 10 ounces of regular Gatorade on a daily basis to prevent lower extremity cramping. No polyuria plication or polydipsia. Denies poorly healing wounds or vision loss. No outside blood sugars to report  Follow essential hypertension: She reports that she saw her neurologist earlier this week and blood pressure was 118/68. She denies any lightheadedness, chest pain, shortness of breath or peripheral edema. She continues on metoprolol twice a day without any known side effects.   complains of right shoulder pain that has been present for the past 6 months. It's mild in severity and only present when lying on her right side. Pain is nonradiating and feels like it sitting on top of the shoulder. No other motion seemed to make symptoms worse, she is currently not taking the medications were pursuing any interventions. She wants know what might be causing this. She denies any swelling redness or warmth at the site of pain. She denies joint pain elsewhere   Review Of Systems Outlined In HPI  Past Medical History  Diagnosis Date  . Hypertension   . Hyperlipidemia   . GERD (gastroesophageal reflux disease)   . Seasonal allergies   . Nerve pain     Past Surgical History  Procedure Laterality Date  . Tonsillectomy    . Abdominal hysterectomy    . Cesarean section    . Lumbar disc surgery    . Cholecystectomy     Family History  Problem Relation Age of Onset  . Cancer Mother     breast  . Dementia Mother   . Hypertension Mother   . Hyperlipidemia Mother     History   Social History  . Marital Status: Married    Spouse Name: N/A  . Number of Children: N/A  . Years of Education: N/A   Occupational History  . Not on file.   Social History Main Topics  . Smoking status: Never Smoker   .  Smokeless tobacco: Not on file  . Alcohol Use: No  . Drug Use: No  . Sexual Activity: Not on file   Other Topics Concern  . Not on file   Social History Narrative     Objective: BP 130/71 mmHg  Pulse 67  Ht 5\' 3"  (1.6 m)  Wt 157 lb (71.215 kg)  BMI 27.82 kg/m2  General: Alert and Oriented, No Acute Distress HEENT: Pupils equal, round, reactive to light. Conjunctivae clear.Moist mucous membranesgs: Clear to auscultation bilaterally, no wheezing/ronchi/rales.  Comfortable work of breathing. Good air movement. Cardiac: Regular rate and rhythm. Normal S1/S2.  No murmurs, rubs, nor gallops.   Right shoulder exam reveals full range of motion and strength in all planes of motion and with individual rotator cuff testing. No overlying redness warmth or swelling.  Neer's test negative.  Hawkins test negative. Empty can negative. Crossarm test positive. O'Brien's test negative. Apprehension test negative. Speed's test negative. Extremities: No peripheral edema.  Strong peripheral pulses.  Mental Status: No depression, anxiety, nor agitation. Skin: Warm and dry.  Assessment & Plan: Cynthia Adams was seen today for follow-up.  Diagnoses and all orders for this visit:  Essential hypertension  Prediabetes Orders: -     POCT HgB A1C  Right shoulder pain   Prediabetes: A1c 6.3 today, controlled, discussed benefits of cutting back on sugary beverages and  increasing physical activity to prevent progression to type 2 diabetes   essential hypertension: Controlled continue metoprolol Right shoulder pain: Suspect this is coming from the acromioclavicular joint, discussed using topical therapy including but not limited to BenGay/icy hot. She does not believe that the pain is bad enough where she would want to take a medication for it. Also discussed that we could confirm this diagnosis with an x-ray if she would be interested in pursuing cortisone injections for long-term relief.  Return in about 3 months  (around 02/21/2015) for Sugar and BP.

## 2014-12-02 ENCOUNTER — Other Ambulatory Visit: Payer: Self-pay | Admitting: Family Medicine

## 2014-12-02 LAB — POCT GLYCOSYLATED HEMOGLOBIN (HGB A1C): HEMOGLOBIN A1C: 6.3

## 2014-12-03 MED ORDER — GEMFIBROZIL 600 MG PO TABS: 600.0000 mg | ORAL_TABLET | Freq: Two times a day (BID) | ORAL | Status: DC

## 2014-12-03 NOTE — Telephone Encounter (Signed)
Cynthia Adams, Rx sent to [facility].

## 2015-02-20 ENCOUNTER — Ambulatory Visit: Payer: Medicare Other | Admitting: Family Medicine

## 2015-02-20 ENCOUNTER — Telehealth: Payer: Self-pay

## 2015-02-20 ENCOUNTER — Other Ambulatory Visit: Payer: Self-pay | Admitting: Family Medicine

## 2015-02-20 DIAGNOSIS — Z1231 Encounter for screening mammogram for malignant neoplasm of breast: Secondary | ICD-10-CM

## 2015-02-20 DIAGNOSIS — M8580 Other specified disorders of bone density and structure, unspecified site: Secondary | ICD-10-CM

## 2015-02-20 NOTE — Telephone Encounter (Signed)
Cynthia Adams called imaging to schedule her mammogram, and while on the phone with them she wanted to schedule a bone density test.  Imaging called [facility] to check to see when here last  DEXA was and she's at her 2 year mark.  Can we order this DEXA scan for her?

## 2015-02-24 NOTE — Telephone Encounter (Addendum)
Ok, can you also check to see if radiology has processed this order, I've recently noticed that DEXA scan orders are not being recognized by their office.

## 2015-02-24 NOTE — Telephone Encounter (Signed)
Seth Bake, An order for a dexa scan has been placed.  Can you please call radiology and see if this can be done on the same day as her mammogram?

## 2015-02-24 NOTE — Addendum Note (Signed)
Addended by: Marcial Pacas on: 02/24/2015 12:52 PM   Modules accepted: Orders

## 2015-02-24 NOTE — Telephone Encounter (Signed)
dexa scans are done on Thursday so if a mammo is scheduled for a Thursday then they can be done on the same day.

## 2015-02-25 NOTE — Telephone Encounter (Signed)
It being ordered now

## 2015-02-27 ENCOUNTER — Other Ambulatory Visit: Payer: Self-pay | Admitting: Family Medicine

## 2015-02-28 ENCOUNTER — Ambulatory Visit (INDEPENDENT_AMBULATORY_CARE_PROVIDER_SITE_OTHER): Payer: Medicare Other | Admitting: Family Medicine

## 2015-02-28 ENCOUNTER — Encounter: Payer: Self-pay | Admitting: Family Medicine

## 2015-02-28 VITALS — BP 121/68 | HR 69 | Wt 153.0 lb

## 2015-02-28 DIAGNOSIS — N898 Other specified noninflammatory disorders of vagina: Secondary | ICD-10-CM

## 2015-02-28 DIAGNOSIS — R5382 Chronic fatigue, unspecified: Secondary | ICD-10-CM

## 2015-02-28 DIAGNOSIS — R7303 Prediabetes: Secondary | ICD-10-CM

## 2015-02-28 DIAGNOSIS — R7309 Other abnormal glucose: Secondary | ICD-10-CM | POA: Diagnosis not present

## 2015-02-28 LAB — HEMOGLOBIN: HEMOGLOBIN: 11.5 g/dL — AB (ref 12.0–15.0)

## 2015-02-28 LAB — POCT GLYCOSYLATED HEMOGLOBIN (HGB A1C): HEMOGLOBIN A1C: 5.9

## 2015-02-28 MED ORDER — ZOSTER VACCINE LIVE 19400 UNT/0.65ML ~~LOC~~ SOLR: 0.6500 mL | Freq: Once | SUBCUTANEOUS | Status: DC

## 2015-02-28 MED ORDER — CLOTRIMAZOLE-BETAMETHASONE 1-0.05 % EX CREA: TOPICAL_CREAM | CUTANEOUS | Status: AC

## 2015-02-28 NOTE — Progress Notes (Signed)
CC: Cynthia Adams is a 69 y.o. female is here for Follow-up and Diabetes   Subjective: HPI: Follow-up prediabetes: She's lost 5 pounds since I saw her last however she denies any changes in diet or exercise regimen. Denies polyuria polyphagia or polydipsia. Denies poorly healing wounds.  Her major complaint today is fatigue hasn't present for at least a month now on a daily basis. It's described as running out of energy earlier in the evening that she is used to. She denies weakness or shortness of breath. There've been no changes to her medication regimen. She tells me it almost feels like she is depressed. Denies changes in interest in all hobbies. Denies appetite change or sleeping changes. Denies fevers, chills, abdominal pain or gastrointestinal complaints. Denies any genitourinary complaints other than that described below.  She also complains of a small sore on the inside of the vagina, further localize to the left labia minora. She is hesitant to show it to me today however she notes that she can use on it to help minimize the pain. been present for a month and seems to come and go. It is most painful when urinating  Review Of Systems Outlined In HPI  Past Medical History  Diagnosis Date  . Hypertension   . Hyperlipidemia   . GERD (gastroesophageal reflux disease)   . Seasonal allergies   . Nerve pain     Past Surgical History  Procedure Laterality Date  . Tonsillectomy    . Abdominal hysterectomy    . Cesarean section    . Lumbar disc surgery    . Cholecystectomy     Family History  Problem Relation Age of Onset  . Cancer Mother     breast  . Dementia Mother   . Hypertension Mother   . Hyperlipidemia Mother     Social History   Social History  . Marital Status: Married    Spouse Name: N/A  . Number of Children: N/A  . Years of Education: N/A   Occupational History  . Not on file.   Social History Main Topics  . Smoking status: Never Smoker   . Smokeless  tobacco: Not on file  . Alcohol Use: No  . Drug Use: No  . Sexual Activity: Not on file   Other Topics Concern  . Not on file   Social History Narrative     Objective: BP 121/68 mmHg  Pulse 69  Wt 153 lb (69.4 kg)  Vital signs reviewed. General: Alert and Oriented, No Acute Distress HEENT: Pupils equal, round, reactive to light. Conjunctivae clear.  External ears unremarkable.  Moist mucous membranes. Lungs: Clear and comfortable work of breathing, speaking in full sentences without accessory muscle use. Cardiac: Regular rate and rhythm.  Neuro: CN II-XII grossly intact, gait normal. Extremities: No peripheral edema.  Strong peripheral pulses.  Mental Status: No depression, anxiety, nor agitation. Logical though process. Skin: Warm and dry.  Assessment & Plan: [name] was seen today for follow-up and diabetes.  Diagnoses and all orders for this visit:  Prediabetes -     POCT HgB A1C -     Vit D  25 hydroxy (rtn osteoporosis monitoring) -     Hemoglobin  Chronic fatigue  Vaginal lesion  Other orders -     clotrimazole-betamethasone (LOTRISONE) cream; Apply to affected area twice a day for two weeks. -     zoster vaccine live, PF, (ZOSTAVAX) 27253 UNT/0.65ML injection; Inject 19,400 Units into the skin once.  Prediabetes: A1c of 5.9, controlled and improved since last visit. Chronic fatigue: Rule out anemia and vitamin D deficiency Vaginal lesion: Does not want to purchase the exam today, will try Lotrisone and she is agreeable to return in one week for examination if still bothersome.   Return if symptoms worsen or fail to improve.

## 2015-03-01 LAB — VITAMIN D 25 HYDROXY (VIT D DEFICIENCY, FRACTURES): Vit D, 25-Hydroxy: 38 ng/mL (ref 30–100)

## 2015-03-12 ENCOUNTER — Ambulatory Visit (INDEPENDENT_AMBULATORY_CARE_PROVIDER_SITE_OTHER): Payer: Medicare Other

## 2015-03-12 DIAGNOSIS — Z1231 Encounter for screening mammogram for malignant neoplasm of breast: Secondary | ICD-10-CM | POA: Diagnosis not present

## 2015-03-12 DIAGNOSIS — R928 Other abnormal and inconclusive findings on diagnostic imaging of breast: Secondary | ICD-10-CM

## 2015-03-12 DIAGNOSIS — Z13820 Encounter for screening for osteoporosis: Secondary | ICD-10-CM | POA: Diagnosis not present

## 2015-03-25 ENCOUNTER — Other Ambulatory Visit: Payer: Self-pay | Admitting: Family Medicine

## 2015-03-25 DIAGNOSIS — R928 Other abnormal and inconclusive findings on diagnostic imaging of breast: Secondary | ICD-10-CM

## 2015-03-26 ENCOUNTER — Ambulatory Visit
Admission: RE | Admit: 2015-03-26 | Discharge: 2015-03-26 | Disposition: A | Discharge status: Home / Self Care | Payer: Medicare Other | Source: Ambulatory Visit | Attending: Family Medicine | Admitting: Family Medicine

## 2015-03-26 DIAGNOSIS — R928 Other abnormal and inconclusive findings on diagnostic imaging of breast: Secondary | ICD-10-CM

## 2015-03-26 DIAGNOSIS — N6489 Other specified disorders of breast: Secondary | ICD-10-CM | POA: Diagnosis not present

## 2015-04-07 ENCOUNTER — Encounter: Payer: Self-pay | Admitting: Family Medicine

## 2015-04-07 ENCOUNTER — Ambulatory Visit (INDEPENDENT_AMBULATORY_CARE_PROVIDER_SITE_OTHER): Payer: Medicare Other | Admitting: Family Medicine

## 2015-04-07 VITALS — BP 139/65 | HR 58 | Wt 155.0 lb

## 2015-04-07 DIAGNOSIS — N63 Unspecified lump in breast: Secondary | ICD-10-CM | POA: Diagnosis not present

## 2015-04-07 DIAGNOSIS — D509 Iron deficiency anemia, unspecified: Secondary | ICD-10-CM

## 2015-04-07 DIAGNOSIS — N6320 Unspecified lump in the left breast, unspecified quadrant: Secondary | ICD-10-CM | POA: Insufficient documentation

## 2015-04-07 NOTE — Progress Notes (Signed)
CC: Cynthia Adams is a 69 y.o. female is here for Medication Management   Subjective: HPI:  Left sided breast mass found earlier this month. She opted to delay biopsy until May if not resolved by then. Denies pain, anxiety, or overlying skin changes.  F/U Anemia: continues to  Feel some degree of fatigue however she believes it's improving slowly since starting on daily iron supplementation. She's not had any side effects that she knows of. She denies any bleeding or bruising. She is pleased so far with response. Denies shortness of breath, chest pain, limb claudication or any motor or sensory disturbances  Review Of Systems Outlined In HPI  Past Medical History  Diagnosis Date  . Hypertension   . Hyperlipidemia   . GERD (gastroesophageal reflux disease)   . Seasonal allergies   . Nerve pain     Past Surgical History  Procedure Laterality Date  . Tonsillectomy    . Abdominal hysterectomy    . Cesarean section    . Lumbar disc surgery    . Cholecystectomy     Family History  Problem Relation Age of Onset  . Cancer Mother     breast  . Dementia Mother   . Hypertension Mother   . Hyperlipidemia Mother     Social History   Social History  . Marital Status: Married    Spouse Name: N/A  . Number of Children: N/A  . Years of Education: N/A   Occupational History  . Not on file.   Social History Main Topics  . Smoking status: Never Smoker   . Smokeless tobacco: Not on file  . Alcohol Use: No  . Drug Use: No  . Sexual Activity: Not on file   Other Topics Concern  . Not on file   Social History Narrative     Objective: BP 139/65 mmHg  Pulse 58  Wt 155 lb (70.308 kg)  Vital signs reviewed. General: Alert and Oriented, No Acute Distress HEENT: Pupils equal, round, reactive to light. Conjunctivae clear.  External ears unremarkable.  Moist mucous membranes. Lungs: Clear and comfortable work of breathing, speaking in full sentences without accessory muscle  use. Cardiac: Regular rate and rhythm.  Neuro: CN II-XII grossly intact, gait normal. Extremities: No peripheral edema.  Strong peripheral pulses.  Mental Status: No depression, anxiety, nor agitation. Logical though process. Skin: Warm and dry.  Assessment & Plan: [name] was seen today for medication management.  Diagnoses and all orders for this visit:  Left breast mass  Anemia, iron deficiency -     CBC   Left breast mass: Agree with her decision to not biopsy as of yet and to readdress this decision in May based on the radiology report she had earlier this month. Anemia: Currently improving due for repeat hemoglobin level with [facility].continue iron pending results  25 minutes spent face-to-face during visit today of which at least 50% was counseling or coordinating care regarding: 1. Left breast mass   2. Anemia, iron deficiency       Return in about 3 months (around 07/08/2015) for Blood Pressure.

## 2015-04-08 LAB — CBC
HCT: 36 % (ref 36.0–46.0)
HEMOGLOBIN: 12 g/dL (ref 12.0–15.0)
MCH: 30.5 pg (ref 26.0–34.0)
MCHC: 33.3 g/dL (ref 30.0–36.0)
MCV: 91.4 fL (ref 78.0–100.0)
MPV: 12.1 fL (ref 8.6–12.4)
Platelets: 314 10*3/uL (ref 150–400)
RBC: 3.94 MIL/uL (ref 3.87–5.11)
RDW: 13.7 % (ref 11.5–15.5)
WBC: 7.3 10*3/uL (ref 4.0–10.5)

## 2015-05-09 ENCOUNTER — Other Ambulatory Visit: Payer: Self-pay

## 2015-05-09 MED ORDER — FLUTICASONE PROPIONATE 50 MCG/ACT NA SUSP: 2.0000 | Freq: Every day | NASAL | Status: DC

## 2015-05-11 DIAGNOSIS — H269 Unspecified cataract: Secondary | ICD-10-CM

## 2015-05-11 HISTORY — DX: Unspecified cataract: H26.9

## 2015-05-13 ENCOUNTER — Ambulatory Visit (INDEPENDENT_AMBULATORY_CARE_PROVIDER_SITE_OTHER): Payer: Medicare Other | Admitting: Osteopathic Medicine

## 2015-05-13 ENCOUNTER — Encounter: Payer: Self-pay | Admitting: Osteopathic Medicine

## 2015-05-13 VITALS — BP 149/63 | HR 68 | Temp 98.2°F | Ht 63.0 in | Wt 156.0 lb

## 2015-05-13 DIAGNOSIS — J0100 Acute maxillary sinusitis, unspecified: Secondary | ICD-10-CM | POA: Diagnosis not present

## 2015-05-13 MED ORDER — AMOXICILLIN-POT CLAVULANATE 875-125 MG PO TABS: 1.0000 | ORAL_TABLET | Freq: Two times a day (BID) | ORAL | Status: DC

## 2015-05-13 NOTE — Progress Notes (Signed)
HPI: Cynthia Adams is a 70 y.o. female who presents to [facility]  today for chief complaint of:  Chief Complaint  Patient presents with  . Sinus Problem    . Location: sinuses, R head pain over the weekend, R sinus pain, R ear . Quality: pressure, dry bloody nose usually in the mornings . Duration: 7+ days . Context: yes sick contacts . Modifying factors: has tried the following OTC medications: Flonase  with relief . Assoc signs/symptoms: no fever/chills, no productive cough, No  body aches, No  GI upset, headache usually happens a bit later in the afternoon   Past medical, social and family history reviewed: Past Medical History  Diagnosis Date  . Hypertension   . Hyperlipidemia   . GERD (gastroesophageal reflux disease)   . Seasonal allergies   . Nerve pain    Past Surgical History  Procedure Laterality Date  . Tonsillectomy    . Abdominal hysterectomy    . Cesarean section    . Lumbar disc surgery    . Cholecystectomy     Social History  Substance Use Topics  . Smoking status: Never Smoker   . Smokeless tobacco: Not on file  . Alcohol Use: No   Family History  Problem Relation Age of Onset  . Cancer Mother     breast  . Dementia Mother   . Hypertension Mother   . Hyperlipidemia Mother     Current Outpatient Prescriptions  Medication Sig Dispense Refill  . aspirin 81 MG tablet Take 81 mg by mouth daily.    . Cyanocobalamin (B-12 PO) Take by mouth.    . estradiol (ESTRACE) 0.5 MG tablet Take 1 tablet (0.5 mg total) by mouth daily. Need appointment for more refills 30 tablet 0  . fluticasone (FLONASE) 50 MCG/ACT nasal spray Place 2 sprays into both nostrils daily. 16 g 3  . gabapentin (NEURONTIN) 600 MG tablet Take 600 mg by mouth 3 (three) times daily.    Marland Kitchen gemfibrozil (LOPID) 600 MG tablet Take 1 tablet (600 mg total) by mouth 2 (two) times daily before a meal. 180 tablet 1  . HYDROcodone-acetaminophen (NORCO/VICODIN)  5-325 MG per tablet Take 1 tablet by mouth. Take 1 tablet by mouth every 6 hours prn pain    . loratadine (CLARITIN) 10 MG tablet Take 10 mg by mouth daily.    . metoprolol (LOPRESSOR) 100 MG tablet Take 0.5 tablets (50 mg total) by mouth 2 (two) times daily. 180 tablet 2  . MULTIPLE VITAMIN PO Take by mouth.    . pantoprazole (PROTONIX) 40 MG tablet Take 1 tablet (40 mg total) by mouth daily. Need appointment for more refills 30 tablet 0   No current facility-administered medications for this visit.   Allergies  Allergen Reactions  . Minocycline   . Sulfa Antibiotics       Review of Systems: CONSTITUTIONAL: no fever/chills HEAD/EYES/EARS/NOSE/THROAT: yes headache, no vision change or hearing change, no sore throat CARDIAC: No chest pain/pressure/palpitations, no orthopnea RESPIRATORY: no cough, no shortness of breath GASTROINTESTINAL: no nausea, no vomiting, no abdominal pain/blood in stool/diarrhea/constipation MUSCULOSKELETAL: no myalgia/arthralgia   Exam:  BP 149/63 mmHg  Pulse 68  Temp(Src) 98.2 F (36.8 C) (Oral)  Ht 5\' 3"  (1.6 m)  Wt 156 lb (70.761 kg)  BMI 27.64 kg/m2 Constitutional: VSS, see above. General Appearance: alert, well-developed, well-nourished, NAD Eyes: Normal lids and conjunctive, non-icteric sclera, PERRLA Ears, Nose, Mouth, Throat: Normal external inspection ears/nares/mouth/lips/gums, normal TM, MMM;  posterior pharynx without erythema, without exudate Neck: No masses, trachea midline. No thyroid enlargement/tenderness/mass appreciated, normal lymph nodes Respiratory: Normal respiratory effort. No  wheeze/rhonchi/rales Cardiovascular: S1/S2 normal, no murmur/rub/gallop auscultated. RRR. No carotid bruit or JVD. No lower extremity edema.     ASSESSMENT/PLAN:  Acute maxillary sinusitis, recurrence not specified - Plan: amoxicillin-clavulanate (AUGMENTIN) 875-125 MG tablet  Continue Flonase. ER headache precautions reviewed.       Return if  symptoms worsen or fail to improve.

## 2015-05-21 DIAGNOSIS — G5601 Carpal tunnel syndrome, right upper limb: Secondary | ICD-10-CM | POA: Diagnosis not present

## 2015-05-21 DIAGNOSIS — M5412 Radiculopathy, cervical region: Secondary | ICD-10-CM | POA: Diagnosis not present

## 2015-05-21 DIAGNOSIS — G5602 Carpal tunnel syndrome, left upper limb: Secondary | ICD-10-CM | POA: Diagnosis not present

## 2015-05-21 DIAGNOSIS — G603 Idiopathic progressive neuropathy: Secondary | ICD-10-CM | POA: Diagnosis not present

## 2015-05-21 DIAGNOSIS — M5417 Radiculopathy, lumbosacral region: Secondary | ICD-10-CM | POA: Diagnosis not present

## 2015-06-06 ENCOUNTER — Other Ambulatory Visit: Payer: Self-pay

## 2015-06-06 MED ORDER — PANTOPRAZOLE SODIUM 40 MG PO TBEC: 40.0000 mg | DELAYED_RELEASE_TABLET | Freq: Every day | ORAL | Status: DC

## 2015-07-23 ENCOUNTER — Other Ambulatory Visit: Payer: Self-pay | Admitting: Family Medicine

## 2015-07-25 ENCOUNTER — Other Ambulatory Visit: Payer: Self-pay | Admitting: Family Medicine

## 2015-08-18 ENCOUNTER — Encounter: Payer: Self-pay | Admitting: Family Medicine

## 2015-08-18 ENCOUNTER — Ambulatory Visit (INDEPENDENT_AMBULATORY_CARE_PROVIDER_SITE_OTHER): Payer: Medicare Other | Admitting: Family Medicine

## 2015-08-18 VITALS — BP 156/77 | HR 75 | Wt 157.0 lb

## 2015-08-18 DIAGNOSIS — D509 Iron deficiency anemia, unspecified: Secondary | ICD-10-CM

## 2015-08-18 DIAGNOSIS — I10 Essential (primary) hypertension: Secondary | ICD-10-CM

## 2015-08-18 DIAGNOSIS — E785 Hyperlipidemia, unspecified: Secondary | ICD-10-CM | POA: Diagnosis not present

## 2015-08-18 DIAGNOSIS — N63 Unspecified lump in breast: Secondary | ICD-10-CM

## 2015-08-18 DIAGNOSIS — N6320 Unspecified lump in the left breast, unspecified quadrant: Secondary | ICD-10-CM

## 2015-08-18 DIAGNOSIS — Z0000 Encounter for general adult medical examination without abnormal findings: Secondary | ICD-10-CM

## 2015-08-18 LAB — CBC
HCT: 33.2 % — ABNORMAL LOW (ref 35.0–45.0)
Hemoglobin: 10.8 g/dL — ABNORMAL LOW (ref 11.7–15.5)
MCH: 29.8 pg (ref 27.0–33.0)
MCHC: 32.5 g/dL (ref 32.0–36.0)
MCV: 91.7 fL (ref 80.0–100.0)
MPV: 11.8 fL (ref 7.5–12.5)
PLATELETS: 363 10*3/uL (ref 140–400)
RBC: 3.62 MIL/uL — AB (ref 3.80–5.10)
RDW: 14.3 % (ref 11.0–15.0)
WBC: 6.4 10*3/uL (ref 3.8–10.8)

## 2015-08-18 LAB — COMPLETE METABOLIC PANEL WITH GFR
ALBUMIN: 4.6 g/dL (ref 3.6–5.1)
ALK PHOS: 73 U/L (ref 33–130)
ALT: 16 U/L (ref 6–29)
AST: 21 U/L (ref 10–35)
BILIRUBIN TOTAL: 0.3 mg/dL (ref 0.2–1.2)
BUN: 19 mg/dL (ref 7–25)
CALCIUM: 9.7 mg/dL (ref 8.6–10.4)
CO2: 27 mmol/L (ref 20–31)
CREATININE: 0.88 mg/dL (ref 0.50–0.99)
Chloride: 104 mmol/L (ref 98–110)
GFR, Est African American: 78 mL/min (ref >=60)
GFR, Est Non African American: 67 mL/min (ref >=60)
GLUCOSE: 117 mg/dL — AB (ref 65–99)
Potassium: 4.8 mmol/L (ref 3.5–5.3)
SODIUM: 140 mmol/L (ref 135–146)
TOTAL PROTEIN: 7.3 g/dL (ref 6.1–8.1)

## 2015-08-18 LAB — LIPID PANEL
CHOL/HDL RATIO: 4.9 ratio (ref <=5.0)
Cholesterol: 192 mg/dL (ref 125–200)
HDL: 39 mg/dL — ABNORMAL LOW (ref >=46)
LDL CALC: 121 mg/dL (ref <130)
TRIGLYCERIDES: 160 mg/dL — AB (ref <150)
VLDL: 32 mg/dL — AB (ref <30)

## 2015-08-18 NOTE — Progress Notes (Signed)
Subjective:    Cynthia Adams is a 70 y.o. female who presents for Medicare Annual/Subsequent preventive examination.  Preventive Screening-Counseling & Management  Tobacco History  Smoking status  . Never Smoker   Smokeless tobacco  . Not on file    Colonoscopy: done 06/2007 UTD Papsmear: No longer checking per patient, no abnormals Mammogram: Repeat US due next month  DEXA: UTD  Influenza Vaccine: out of season Pneumovax: UTD Td/Tdap: UTD Zoster: not indicated given recent outbreak   Problems Prior to Visit 1. neuropathy  Current Problems (verified) Patient Active Problem List   Diagnosis Date Noted  . Left breast mass 04/07/2015  . Right shoulder pain 11/21/2014  . Cyst of left breast 06/13/2014  . Spinal stenosis of lumbar region 06/13/2014  . Bilateral renal cysts 06/13/2014  . Anemia, iron deficiency 06/13/2014  . Essential hypertension 05/13/2014  . Hyperlipidemia 05/13/2014  . Osteopenia 05/13/2014  . GERD (gastroesophageal reflux disease) 05/13/2014  . Prediabetes 05/13/2014  . Shingles outbreak 05/13/2014    Medications Prior to Visit Current Outpatient Prescriptions on File Prior to Visit  Medication Sig Dispense Refill  . aspirin 81 MG tablet Take 81 mg by mouth daily.    . Cyanocobalamin (B-12 PO) Take by mouth.    . estradiol (ESTRACE) 0.5 MG tablet Take 1 tablet (0.5 mg total) by mouth daily. Need appointment for more refills 30 tablet 0  . fluticasone (FLONASE) 50 MCG/ACT nasal spray Place 2 sprays into both nostrils daily. 16 g 3  . gabapentin (NEURONTIN) 600 MG tablet Take 600 mg by mouth 3 (three) times daily.    Marland Kitchen gemfibrozil (LOPID) 600 MG tablet Take 1 tablet (600 mg total) by mouth 2 (two) times daily before a meal. 180 tablet 1  . loratadine (CLARITIN) 10 MG tablet Take 10 mg by mouth daily.    . metoprolol (LOPRESSOR) 100 MG tablet Take one-half tablet by mouth twice daily.  NEED FOLLOW UP APPOINTMENT FOR MORE REFILLS 30 tablet 0  .  MULTIPLE VITAMIN PO Take by mouth.    . pantoprazole (PROTONIX) 40 MG tablet Take 1 tablet by mouth  daily 60 tablet 0   No current facility-administered medications on file prior to visit.    Current Medications (verified) Current Outpatient Prescriptions  Medication Sig Dispense Refill  . aspirin 81 MG tablet Take 81 mg by mouth daily.    . Cyanocobalamin (B-12 PO) Take by mouth.    . estradiol (ESTRACE) 0.5 MG tablet Take 1 tablet (0.5 mg total) by mouth daily. Need appointment for more refills 30 tablet 0  . fluticasone (FLONASE) 50 MCG/ACT nasal spray Place 2 sprays into both nostrils daily. 16 g 3  . gabapentin (NEURONTIN) 600 MG tablet Take 600 mg by mouth 3 (three) times daily.    Marland Kitchen gemfibrozil (LOPID) 600 MG tablet Take 1 tablet (600 mg total) by mouth 2 (two) times daily before a meal. 180 tablet 1  . loratadine (CLARITIN) 10 MG tablet Take 10 mg by mouth daily.    . metoprolol (LOPRESSOR) 100 MG tablet Take one-half tablet by mouth twice daily.  NEED FOLLOW UP APPOINTMENT FOR MORE REFILLS 30 tablet 0  . MULTIPLE VITAMIN PO Take by mouth.    . pantoprazole (PROTONIX) 40 MG tablet Take 1 tablet by mouth  daily 60 tablet 0   No current facility-administered medications for this visit.     Allergies (verified) Minocycline and Sulfa antibiotics   PAST HISTORY  Family History Family History  Problem  Relation Age of Onset  . Cancer Mother     breast  . Dementia Mother   . Hypertension Mother   . Hyperlipidemia Mother     Social History Social History  Substance Use Topics  . Smoking status: Never Smoker   . Smokeless tobacco: Not on file  . Alcohol Use: No     Are there smokers in your home (other than you)? No  Risk Factors Current exercise habits: The patient does not participate in regular exercise at present.  Dietary issues discussed: dash diet   Cardiac risk factors: advanced age (older than 28 for men, 41 for women), dyslipidemia and sedentary  lifestyle.  Depression Screen (Note: if answer to either of the following is "Yes", a more complete depression screening is indicated)   Over the past two weeks, have you felt down, depressed or hopeless? No  Over the past two weeks, have you felt little interest or pleasure in doing things? No  Have you lost interest or pleasure in daily life? No  Do you often feel hopeless? No  Do you cry easily over simple problems? No  Activities of Daily Living In your present state of health, do you have any difficulty performing the following activities?:  Driving? No Managing money?  No Feeding yourself? No Getting from bed to chair? No{ Climbing a flight of stairs? No Preparing food and eating?: No Bathing or showering? No Getting dressed: No Getting to the toilet? No Using the toilet:No Moving around from place to place: No In the past year have you fallen or had a near fall?:No   Are you sexually active?  No  Do you have more than one partner?  No  Hearing Difficulties: No Do you often ask people to speak up or repeat themselves? No Do you experience ringing or noises in your ears? No Do you have difficulty understanding soft or whispered voices? No   Do you feel that you have a problem with memory? No  Do you often misplace items? No  Do you feel safe at home?  Yes  Cognitive Testing  Alert? Yes  Normal Appearance?Yes  Oriented to person? Yes  Place? Yes   Time? Yes  Recall of three objects?  Yes  Can perform simple calculations? Yes  Displays appropriate judgment?Yes  Can read the correct time from a watch face?Yes   Advanced Directives have been discussed with the patient? Yes  List the Names of Other Physician/Practitioners you currently use: 1.  [place]   Indicate any recent Medical Services you may have received from other than [facility] providers in the past year (date may be approximate).  Immunization History  Administered Date(s) Administered  .  Influenza-Unspecified 01/09/2015  . Pneumococcal Polysaccharide-23 05/08/2006  . Td 05/10/2004  . Tdap 08/16/2014  . Zoster 03/17/2015    Screening Tests Health Maintenance  Topic Date Due  . Hepatitis C Screening  1945/08/07  . PNA vac Low Risk Adult (1 of 2 - PCV13) 03/28/2011  . INFLUENZA VACCINE  12/09/2015  . MAMMOGRAM  03/11/2017  . COLONOSCOPY  06/10/2017  . TETANUS/TDAP  08/15/2024  . DEXA SCAN  Completed  . ZOSTAVAX  Completed    All answers were reviewed with the patient and necessary referrals were made:  Marcial Pacas, DO   08/18/2015   History reviewed: allergies, current medications, past family history, past medical history, past social history, past surgical history and problem list  Review of Systems   Review of Systems - General  ROS: negative for - chills, fever, night sweats, weight gain or weight loss Ophthalmic ROS: negative for - decreased vision Psychological ROS: negative for - anxiety or depression ENT ROS: negative for - hearing change, nasal congestion, tinnitus or allergies Hematological and Lymphatic ROS: negative for - bleeding problems, bruising or swollen lymph nodes Breast ROS: negative Respiratory ROS: no cough, shortness of breath, or wheezing Cardiovascular ROS: no chest pain or dyspnea on exertion Gastrointestinal ROS: no abdominal pain, change in bowel habits, or black or bloody stools Genito-Urinary ROS: negative for - genital discharge, genital ulcers, incontinence or abnormal bleeding from genitals Musculoskeletal ROS: negative for - joint pain or muscle pain Neurological ROS: negative for - headaches or memory loss Dermatological ROS: negative for lumps, mole changes, rash and skin lesion changes Objective:     Vision by Snellen chart: right eye:20/50, left eye:20/40  Body mass index is 27.82 kg/(m^2). BP 156/77 mmHg  Pulse 75  Wt 157 lb (71.215 kg)   General: No Acute Distress HEENT: Atraumatic, normocephalic, conjunctivae  normal without scleral icterus.  No nasal discharge, hearing grossly intact, TMs with good landmarks bilaterally with no middle ear abnormalities, posterior pharynx clear without oral lesions. Neck: Supple, trachea midline, no cervical nor supraclavicular adenopathy. Pulmonary: Clear to auscultation bilaterally without wheezing, rhonchi, nor rales. Cardiac: Regular rate and rhythm.  No murmurs, rubs, nor gallops. No peripheral edema.  2+ peripheral pulses bilaterally. Abdomen: Bowel sounds normal.  No masses.  Non-tender without rebound.  Negative Murphy's sign. MSK: Grossly intact, no signs of weakness.  Full strength throughout upper and lower extremities.  Full ROM in upper and lower extremities.  No midline spinal tenderness. Neuro: Gait unremarkable, CN II-XII grossly intact.  C5-C6 Reflex 2/4 Bilaterally, L4 Reflex 2/4 Bilaterally.  Cerebellar function intact. Skin: No rashes. Psych: Alert and oriented to person/place/time.  Thought process normal. No anxiety/depression.    Assessment:     Needs new glasses      Plan:     During the course of the visit the patient was educated and counseled about appropriate screening and preventive services including:    Screening mammography  Bone densitometry screening  Colorectal cancer screening  Diet review for nutrition referral? Not Indicated ____   Patient Instructions (the written plan) was given to the patient.  Medicare Attestation I have personally reviewed: The patient's medical and social history Their use of alcohol, tobacco or illicit drugs Their current medications and supplements The patient's functional ability including ADLs,fall risks, home safety risks, cognitive, and hearing and visual impairment Diet and physical activities Evidence for depression or mood disorders  The patient's weight, height, BMI, and visual acuity have been recorded in the chart.  I have made referrals, counseling, and provided education  to the patient based on review of the above and I have provided the patient with a written personalized care plan for preventive services.     Marcial Pacas, DO   08/18/2015

## 2015-08-19 ENCOUNTER — Telehealth: Payer: Self-pay | Admitting: Family Medicine

## 2015-08-19 MED ORDER — FERROUS SULFATE 325 (65 FE) MG PO TBEC: 325.0000 mg | DELAYED_RELEASE_TABLET | Freq: Every day | ORAL | Status: DC

## 2015-08-19 NOTE — Telephone Encounter (Signed)
Will you please let patient know that her LDL cholesterol, blood sugar, liver function were normal.  Her triglycerides were only barely elevated and if she wants to cut back to only one gemfibrozil dose a day I think this is worth trying out.  Also her hemoglobin level was in the anemic range and I'd recommend she start a 325mg  OTC ferrous sulfate supplement to help this.  Please f/u in 3 months.

## 2015-08-19 NOTE — Telephone Encounter (Signed)
Pt.notified

## 2015-08-27 ENCOUNTER — Other Ambulatory Visit: Payer: Self-pay

## 2015-08-27 MED ORDER — FLUTICASONE PROPIONATE 50 MCG/ACT NA SUSP: 2.0000 | Freq: Every day | NASAL | Status: DC

## 2015-08-29 ENCOUNTER — Other Ambulatory Visit: Payer: Self-pay | Admitting: Family Medicine

## 2015-09-24 ENCOUNTER — Other Ambulatory Visit: Payer: Self-pay | Admitting: Family Medicine

## 2015-09-24 ENCOUNTER — Ambulatory Visit
Admission: RE | Admit: 2015-09-24 | Discharge: 2015-09-24 | Disposition: A | Discharge status: Home / Self Care | Payer: Medicare Other | Source: Ambulatory Visit | Attending: Family Medicine | Admitting: Family Medicine

## 2015-09-24 DIAGNOSIS — N6320 Unspecified lump in the left breast, unspecified quadrant: Secondary | ICD-10-CM

## 2015-09-24 DIAGNOSIS — N63 Unspecified lump in breast: Secondary | ICD-10-CM | POA: Diagnosis not present

## 2015-09-29 ENCOUNTER — Ambulatory Visit
Admission: RE | Admit: 2015-09-29 | Discharge: 2015-09-29 | Disposition: A | Discharge status: Home / Self Care | Payer: Medicare Other | Source: Ambulatory Visit | Attending: Family Medicine | Admitting: Family Medicine

## 2015-09-29 ENCOUNTER — Other Ambulatory Visit: Payer: Self-pay | Admitting: Family Medicine

## 2015-09-29 DIAGNOSIS — N6320 Unspecified lump in the left breast, unspecified quadrant: Secondary | ICD-10-CM

## 2015-09-29 DIAGNOSIS — N63 Unspecified lump in breast: Secondary | ICD-10-CM | POA: Diagnosis not present

## 2015-09-29 DIAGNOSIS — D242 Benign neoplasm of left breast: Secondary | ICD-10-CM | POA: Diagnosis not present

## 2015-10-01 DIAGNOSIS — Z0100 Encounter for examination of eyes and vision without abnormal findings: Secondary | ICD-10-CM | POA: Diagnosis not present

## 2015-10-15 ENCOUNTER — Other Ambulatory Visit: Payer: Self-pay | Admitting: Family Medicine

## 2015-10-24 DIAGNOSIS — M25572 Pain in left ankle and joints of left foot: Secondary | ICD-10-CM | POA: Diagnosis not present

## 2015-10-24 DIAGNOSIS — Z79899 Other long term (current) drug therapy: Secondary | ICD-10-CM | POA: Diagnosis not present

## 2015-10-24 DIAGNOSIS — Z883 Allergy status to other anti-infective agents status: Secondary | ICD-10-CM | POA: Diagnosis not present

## 2015-10-24 DIAGNOSIS — S93402A Sprain of unspecified ligament of left ankle, initial encounter: Secondary | ICD-10-CM | POA: Diagnosis not present

## 2015-10-24 DIAGNOSIS — S9032XA Contusion of left foot, initial encounter: Secondary | ICD-10-CM | POA: Diagnosis not present

## 2015-10-24 DIAGNOSIS — I10 Essential (primary) hypertension: Secondary | ICD-10-CM | POA: Diagnosis not present

## 2015-10-24 DIAGNOSIS — W1839XA Other fall on same level, initial encounter: Secondary | ICD-10-CM | POA: Diagnosis not present

## 2015-10-24 DIAGNOSIS — Z7982 Long term (current) use of aspirin: Secondary | ICD-10-CM | POA: Diagnosis not present

## 2015-10-24 DIAGNOSIS — Z888 Allergy status to other drugs, medicaments and biological substances status: Secondary | ICD-10-CM | POA: Diagnosis not present

## 2015-10-24 DIAGNOSIS — E785 Hyperlipidemia, unspecified: Secondary | ICD-10-CM | POA: Diagnosis not present

## 2015-10-24 DIAGNOSIS — Z882 Allergy status to sulfonamides status: Secondary | ICD-10-CM | POA: Diagnosis not present

## 2015-10-24 DIAGNOSIS — M79672 Pain in left foot: Secondary | ICD-10-CM | POA: Diagnosis not present

## 2015-11-05 ENCOUNTER — Other Ambulatory Visit: Payer: Self-pay | Admitting: Family Medicine

## 2015-11-13 ENCOUNTER — Other Ambulatory Visit: Payer: Self-pay | Admitting: Family Medicine

## 2015-11-17 ENCOUNTER — Encounter: Payer: Self-pay | Admitting: Family Medicine

## 2015-11-17 ENCOUNTER — Ambulatory Visit (INDEPENDENT_AMBULATORY_CARE_PROVIDER_SITE_OTHER): Payer: Medicare Other | Admitting: Family Medicine

## 2015-11-17 VITALS — BP 163/67 | HR 58 | Wt 159.0 lb

## 2015-11-17 DIAGNOSIS — R232 Flushing: Secondary | ICD-10-CM | POA: Diagnosis not present

## 2015-11-17 DIAGNOSIS — N9089 Other specified noninflammatory disorders of vulva and perineum: Secondary | ICD-10-CM | POA: Diagnosis not present

## 2015-11-17 DIAGNOSIS — I10 Essential (primary) hypertension: Secondary | ICD-10-CM

## 2015-11-17 MED ORDER — METOPROLOL TARTRATE 100 MG PO TABS: ORAL_TABLET | ORAL | Status: DC

## 2015-11-17 MED ORDER — ESTRADIOL 0.5 MG PO TABS: ORAL_TABLET | ORAL | Status: DC

## 2015-11-17 MED ORDER — CLOTRIMAZOLE-BETAMETHASONE 1-0.05 % EX CREA: TOPICAL_CREAM | CUTANEOUS | Status: AC

## 2015-11-17 MED ORDER — GABAPENTIN 600 MG PO TABS: 600.0000 mg | ORAL_TABLET | Freq: Three times a day (TID) | ORAL | Status: DC

## 2015-11-17 NOTE — Progress Notes (Signed)
CC: Cynthia Adams is a 70 y.o. female is here for Hypertension and Vaginal Pain   Subjective: HPI:  Follow-up essential hypertension: Taking metoprolol without percent compliance. Currently taking half a tablet twice a day. Denies chest pain shortness of breath orthopnea peripheral edema nor lightheadedness  Requesting a refill on estrogen. She is using this daily to help prevent hot flashes. She stopped this medication she will have hot flashes within 3 weeks that interfere with her quality of life on daily basis, she denies any known side effects.   For the past month she's had some tenderness on the right labia minora. It's improved for the course of the day after applying Vaseline. It's worse when water touches it or if she is urinating. She denies any vaginal discharge or bleeding. No dysuria.   Review Of Systems Outlined In HPI  Past Medical History  Diagnosis Date  . Hypertension   . Hyperlipidemia   . GERD (gastroesophageal reflux disease)   . Seasonal allergies   . Nerve pain     Past Surgical History  Procedure Laterality Date  . Tonsillectomy    . Abdominal hysterectomy    . Cesarean section    . Lumbar disc surgery    . Cholecystectomy     Family History  Problem Relation Age of Onset  . Cancer Mother     breast  . Dementia Mother   . Hypertension Mother   . Hyperlipidemia Mother     Social History   Social History  . Marital Status: Married    Spouse Name: N/A  . Number of Children: N/A  . Years of Education: N/A   Occupational History  . Not on file.   Social History Main Topics  . Smoking status: Never Smoker   . Smokeless tobacco: Not on file  . Alcohol Use: No  . Drug Use: No  . Sexual Activity: Not on file   Other Topics Concern  . Not on file   Social History Narrative     Objective: BP 163/67 mmHg  Pulse 58  Wt 159 lb (72.122 kg)  General: Alert and Oriented, No Acute Distress HEENT: Pupils equal, round, reactive to light.  Conjunctivae clear. Moist mucous membranes Lungs: Clear to auscultation bilaterally, no wheezing/ronchi/rales.  Comfortable work of breathing. Good air movement. Cardiac: Regular rate and rhythm. Normal S1/S2.  No murmurs, rubs, nor gallops.   Extremities: No peripheral edema.  Strong peripheral pulses.  Mental Status: No depression, anxiety, nor agitation. Skin: Warm and dry. With [place] present as a chaperone, the right medial labia minora was examined showing a erythematous patch approximately the size of a dime without any elevation.  Assessment & Plan: [name] was seen today for hypertension and vaginal pain.  Diagnoses and all orders for this visit:  Essential hypertension  Labia irritation -     clotrimazole-betamethasone (LOTRISONE) cream; Apply to affected area twice a day for two weeks, may require four weeks if involving the feet/toes.  Vasomotor flushing  Other orders -     estradiol (ESTRACE) 0.5 MG tablet; TAKE 1/2 TABLET BY MOUTH -     metoprolol (LOPRESSOR) 100 MG tablet; Take one-half tablet by mouth twice daily. -     gabapentin (NEURONTIN) 600 MG tablet; Take 1 tablet (600 mg total) by mouth 3 (three) times daily.   Essential hypertension: Uncontrolled chronic condition increasing metoprolol Labia irritation: Trial of Lotrisone Vasomotor flushing :controlled with estradiol  Return in about 3 months (around 02/17/2016) for BP.

## 2015-11-19 ENCOUNTER — Telehealth: Payer: Self-pay | Admitting: Family Medicine

## 2015-11-19 MED ORDER — ESTRADIOL 0.5 MG PO TABS: ORAL_TABLET | ORAL | Status: DC

## 2015-11-19 NOTE — Telephone Encounter (Signed)
rx faxed

## 2015-11-19 NOTE — Telephone Encounter (Signed)
[place], Rx placed in in-box ready for pickup/faxing.  

## 2015-11-27 DIAGNOSIS — G2581 Restless legs syndrome: Secondary | ICD-10-CM | POA: Diagnosis not present

## 2015-11-27 DIAGNOSIS — M5417 Radiculopathy, lumbosacral region: Secondary | ICD-10-CM | POA: Diagnosis not present

## 2015-11-27 DIAGNOSIS — M542 Cervicalgia: Secondary | ICD-10-CM | POA: Diagnosis not present

## 2015-11-27 DIAGNOSIS — R201 Hypoesthesia of skin: Secondary | ICD-10-CM | POA: Diagnosis not present

## 2015-12-11 DIAGNOSIS — H25813 Combined forms of age-related cataract, bilateral: Secondary | ICD-10-CM | POA: Diagnosis not present

## 2015-12-11 DIAGNOSIS — H52221 Regular astigmatism, right eye: Secondary | ICD-10-CM | POA: Diagnosis not present

## 2015-12-11 DIAGNOSIS — H40009 Preglaucoma, unspecified, unspecified eye: Secondary | ICD-10-CM | POA: Diagnosis not present

## 2015-12-11 DIAGNOSIS — H02834 Dermatochalasis of left upper eyelid: Secondary | ICD-10-CM | POA: Diagnosis not present

## 2015-12-26 ENCOUNTER — Other Ambulatory Visit: Payer: Self-pay

## 2015-12-26 MED ORDER — PANTOPRAZOLE SODIUM 40 MG PO TBEC: 40.0000 mg | DELAYED_RELEASE_TABLET | Freq: Every day | ORAL | 0 refills | Status: DC

## 2015-12-26 MED ORDER — FLUTICASONE PROPIONATE 50 MCG/ACT NA SUSP: 2.0000 | Freq: Every day | NASAL | 0 refills | Status: DC

## 2016-01-01 ENCOUNTER — Encounter: Payer: Self-pay | Admitting: Family Medicine

## 2016-01-09 DIAGNOSIS — H52221 Regular astigmatism, right eye: Secondary | ICD-10-CM | POA: Diagnosis not present

## 2016-01-09 DIAGNOSIS — H02834 Dermatochalasis of left upper eyelid: Secondary | ICD-10-CM | POA: Diagnosis not present

## 2016-01-09 DIAGNOSIS — H40009 Preglaucoma, unspecified, unspecified eye: Secondary | ICD-10-CM | POA: Diagnosis not present

## 2016-01-09 DIAGNOSIS — H25813 Combined forms of age-related cataract, bilateral: Secondary | ICD-10-CM | POA: Diagnosis not present

## 2016-01-29 DIAGNOSIS — E785 Hyperlipidemia, unspecified: Secondary | ICD-10-CM | POA: Diagnosis not present

## 2016-01-29 DIAGNOSIS — K219 Gastro-esophageal reflux disease without esophagitis: Secondary | ICD-10-CM | POA: Diagnosis not present

## 2016-01-29 DIAGNOSIS — I10 Essential (primary) hypertension: Secondary | ICD-10-CM | POA: Diagnosis not present

## 2016-01-29 DIAGNOSIS — H25811 Combined forms of age-related cataract, right eye: Secondary | ICD-10-CM | POA: Diagnosis not present

## 2016-01-29 DIAGNOSIS — H52221 Regular astigmatism, right eye: Secondary | ICD-10-CM | POA: Diagnosis not present

## 2016-01-30 DIAGNOSIS — H40009 Preglaucoma, unspecified, unspecified eye: Secondary | ICD-10-CM | POA: Diagnosis not present

## 2016-01-30 DIAGNOSIS — H02831 Dermatochalasis of right upper eyelid: Secondary | ICD-10-CM | POA: Diagnosis not present

## 2016-01-30 DIAGNOSIS — H02834 Dermatochalasis of left upper eyelid: Secondary | ICD-10-CM | POA: Diagnosis not present

## 2016-01-30 DIAGNOSIS — H25812 Combined forms of age-related cataract, left eye: Secondary | ICD-10-CM | POA: Diagnosis not present

## 2016-02-19 DIAGNOSIS — K219 Gastro-esophageal reflux disease without esophagitis: Secondary | ICD-10-CM | POA: Diagnosis not present

## 2016-02-19 DIAGNOSIS — Z79899 Other long term (current) drug therapy: Secondary | ICD-10-CM | POA: Diagnosis not present

## 2016-02-19 DIAGNOSIS — I10 Essential (primary) hypertension: Secondary | ICD-10-CM | POA: Diagnosis not present

## 2016-02-19 DIAGNOSIS — H52222 Regular astigmatism, left eye: Secondary | ICD-10-CM | POA: Diagnosis not present

## 2016-02-19 DIAGNOSIS — E785 Hyperlipidemia, unspecified: Secondary | ICD-10-CM | POA: Diagnosis not present

## 2016-02-19 DIAGNOSIS — H25812 Combined forms of age-related cataract, left eye: Secondary | ICD-10-CM | POA: Diagnosis not present

## 2016-02-20 DIAGNOSIS — Z961 Presence of intraocular lens: Secondary | ICD-10-CM | POA: Diagnosis not present

## 2016-02-23 ENCOUNTER — Other Ambulatory Visit: Payer: Self-pay | Admitting: Family Medicine

## 2016-02-23 DIAGNOSIS — Z1231 Encounter for screening mammogram for malignant neoplasm of breast: Secondary | ICD-10-CM

## 2016-02-26 ENCOUNTER — Encounter: Payer: Self-pay | Admitting: Family Medicine

## 2016-02-26 ENCOUNTER — Ambulatory Visit (INDEPENDENT_AMBULATORY_CARE_PROVIDER_SITE_OTHER): Payer: Medicare Other | Admitting: Family Medicine

## 2016-02-26 VITALS — BP 140/55 | HR 55 | Wt 153.0 lb

## 2016-02-26 DIAGNOSIS — Z23 Encounter for immunization: Secondary | ICD-10-CM

## 2016-02-26 DIAGNOSIS — E782 Mixed hyperlipidemia: Secondary | ICD-10-CM | POA: Diagnosis not present

## 2016-02-26 DIAGNOSIS — K219 Gastro-esophageal reflux disease without esophagitis: Secondary | ICD-10-CM | POA: Diagnosis not present

## 2016-02-26 DIAGNOSIS — M48061 Spinal stenosis, lumbar region without neurogenic claudication: Secondary | ICD-10-CM

## 2016-02-26 DIAGNOSIS — I10 Essential (primary) hypertension: Secondary | ICD-10-CM

## 2016-02-26 MED ORDER — METOPROLOL TARTRATE 100 MG PO TABS: 100.0000 mg | ORAL_TABLET | Freq: Two times a day (BID) | ORAL | 1 refills | Status: DC

## 2016-02-26 MED ORDER — ESTRADIOL 0.5 MG PO TABS: 0.2500 mg | ORAL_TABLET | Freq: Every day | ORAL | 3 refills | Status: DC

## 2016-02-26 MED ORDER — PANTOPRAZOLE SODIUM 40 MG PO TBEC: 40.0000 mg | DELAYED_RELEASE_TABLET | Freq: Every day | ORAL | 1 refills | Status: DC

## 2016-02-26 NOTE — Patient Instructions (Signed)
Thank you for coming in today. Call or go to the emergency room if you get worse, have trouble breathing, have chest pains, or palpitations.  Return in 3-6 months for a medicare well exam.  Let me know ahead of time and we will get labs about 1 week before.

## 2016-02-26 NOTE — Progress Notes (Signed)
Cynthia Adams is a 70 y.o. female who presents to [facility] today for follow-up hypertension hyperlipidemia and heartburn.  Hypertension: Taking medications listed below. No chest pains palpitations shortness of breath. She feels well.  Hyperlipidemia: Taking fenofibrate. No abdominal pain.  Heartburn: Taking Protonix. Doing well. Symptoms are well-controlled.  Chronic pain: Managed per neurology with 3 times daily Norco.   Past Medical History:  Diagnosis Date  . GERD (gastroesophageal reflux disease)   . Hyperlipidemia   . Hypertension   . Nerve pain   . Seasonal allergies    Past Surgical History:  Procedure Laterality Date  . ABDOMINAL HYSTERECTOMY    . CESAREAN SECTION    . CHOLECYSTECTOMY    . LUMBAR DISC SURGERY    . TONSILLECTOMY     Social History  Substance Use Topics  . Smoking status: Never Smoker  . Smokeless tobacco: Not on file  . Alcohol use No   family history includes Cancer in her mother; Dementia in her mother; Hyperlipidemia in her mother; Hypertension in her mother.  ROS as above:  Medications: Current Outpatient Prescriptions  Medication Sig Dispense Refill  . aspirin 81 MG tablet Take 81 mg by mouth daily.    . Cyanocobalamin (B-12 PO) Take by mouth.    . estradiol (ESTRACE) 0.5 MG tablet Take 0.5 tablets (0.25 mg total) by mouth daily. 45 tablet 3  . fluticasone (FLONASE) 50 MCG/ACT nasal spray Place 2 sprays into both nostrils daily. 48 g 0  . gabapentin (NEURONTIN) 600 MG tablet Take 1 tablet (600 mg total) by mouth 3 (three) times daily. 270 tablet 1  . gemfibrozil (LOPID) 600 MG tablet Take 1 tablet (600 mg total) by mouth daily. 180 tablet 1  . HYDROcodone-acetaminophen (NORCO) 10-325 MG tablet Take by mouth.    . loratadine (CLARITIN) 10 MG tablet Take 10 mg by mouth daily.    . metoprolol (LOPRESSOR) 100 MG  tablet Take 1 tablet (100 mg total) by mouth 2 (two) times daily. 180 tablet 1  . MULTIPLE VITAMIN PO Take by mouth.    . pantoprazole (PROTONIX) 40 MG tablet Take 1 tablet (40 mg total) by mouth daily. 90 tablet 1   No current facility-administered medications for this visit.    Allergies  Allergen Reactions  . Minocycline   . Sulfa Antibiotics     Health Maintenance Health Maintenance  Topic Date Due  . Hepatitis C Screening  November 09, 1945  . PNA vac Low Risk Adult (1 of 2 - PCV13) 03/28/2011  . INFLUENZA VACCINE  12/09/2015  . COLONOSCOPY  06/10/2017  . MAMMOGRAM  09/28/2017  . TETANUS/TDAP  08/15/2024  . DEXA SCAN  Completed  . ZOSTAVAX  Completed     Exam:  BP (!) 140/55   Pulse (!) 55   Wt 153 lb (69.4 kg)   BMI 27.10 kg/m  Gen: Well NAD HEENT: EOMI,  MMM Lungs: Normal work of breathing. CTABL Heart: RRR no MRG Abd: NABS, Soft. Nondistended, Nontender Exts: Brisk capillary refill, warm and well perfused.    No results found for this or any previous visit (from the past 72 hour(s)). No results found.    Assessment and Plan: 71 y.o. female with  Hypertension: Blood pressure reasonably controlled today. Continue current regimen.  Hyperlipidemia: Fasting labs obtained several months ago at goal. Recheck in the near future.  GERD: Doing well with Protonix refilled medications today.  Chronic pain: Managed  with neurology.   Orders Placed This Encounter  Procedures  . Flu vaccine HIGH DOSE PF (Fluzone High dose)  . Pneumococcal conjugate vaccine 13-valent    Discussed warning signs or symptoms. Please see discharge instructions. Patient expresses understanding.

## 2016-03-17 ENCOUNTER — Ambulatory Visit (INDEPENDENT_AMBULATORY_CARE_PROVIDER_SITE_OTHER): Payer: Medicare Other

## 2016-03-17 DIAGNOSIS — Z1231 Encounter for screening mammogram for malignant neoplasm of breast: Secondary | ICD-10-CM | POA: Diagnosis not present

## 2016-03-17 DIAGNOSIS — R928 Other abnormal and inconclusive findings on diagnostic imaging of breast: Secondary | ICD-10-CM | POA: Diagnosis not present

## 2016-03-19 ENCOUNTER — Telehealth: Payer: Self-pay | Admitting: Family Medicine

## 2016-03-19 DIAGNOSIS — R928 Other abnormal and inconclusive findings on diagnostic imaging of breast: Secondary | ICD-10-CM | POA: Insufficient documentation

## 2016-03-19 NOTE — Telephone Encounter (Signed)
Screening mammogram showed dense breast tissue that requires further evaluation with a diagnostic mammogram and ultrasound. This is a low probability for cancer but should be checked out throughly

## 2016-03-23 ENCOUNTER — Other Ambulatory Visit: Payer: Self-pay | Admitting: Family Medicine

## 2016-03-23 DIAGNOSIS — R928 Other abnormal and inconclusive findings on diagnostic imaging of breast: Secondary | ICD-10-CM

## 2016-03-24 ENCOUNTER — Other Ambulatory Visit: Payer: Medicare Other

## 2016-03-26 ENCOUNTER — Other Ambulatory Visit: Payer: Self-pay | Admitting: Family Medicine

## 2016-03-26 ENCOUNTER — Ambulatory Visit
Admission: RE | Admit: 2016-03-26 | Discharge: 2016-03-26 | Disposition: A | Discharge status: Home / Self Care | Payer: Medicare Other | Source: Ambulatory Visit | Attending: Family Medicine | Admitting: Family Medicine

## 2016-03-26 DIAGNOSIS — R928 Other abnormal and inconclusive findings on diagnostic imaging of breast: Secondary | ICD-10-CM

## 2016-03-26 DIAGNOSIS — N6321 Unspecified lump in the left breast, upper outer quadrant: Secondary | ICD-10-CM | POA: Diagnosis not present

## 2016-03-26 DIAGNOSIS — R922 Inconclusive mammogram: Secondary | ICD-10-CM | POA: Diagnosis not present

## 2016-03-31 ENCOUNTER — Other Ambulatory Visit: Payer: Self-pay | Admitting: Family Medicine

## 2016-03-31 DIAGNOSIS — R928 Other abnormal and inconclusive findings on diagnostic imaging of breast: Secondary | ICD-10-CM

## 2016-04-05 ENCOUNTER — Ambulatory Visit
Admission: RE | Admit: 2016-04-05 | Discharge: 2016-04-05 | Disposition: A | Discharge status: Home / Self Care | Payer: Medicare Other | Source: Ambulatory Visit | Attending: Family Medicine | Admitting: Family Medicine

## 2016-04-05 DIAGNOSIS — R928 Other abnormal and inconclusive findings on diagnostic imaging of breast: Secondary | ICD-10-CM

## 2016-04-05 DIAGNOSIS — N6321 Unspecified lump in the left breast, upper outer quadrant: Secondary | ICD-10-CM | POA: Diagnosis not present

## 2016-04-05 DIAGNOSIS — N6320 Unspecified lump in the left breast, unspecified quadrant: Secondary | ICD-10-CM | POA: Diagnosis not present

## 2016-04-05 DIAGNOSIS — N6012 Diffuse cystic mastopathy of left breast: Secondary | ICD-10-CM | POA: Diagnosis not present

## 2016-05-19 DIAGNOSIS — M5417 Radiculopathy, lumbosacral region: Secondary | ICD-10-CM | POA: Diagnosis not present

## 2016-05-19 DIAGNOSIS — G5603 Carpal tunnel syndrome, bilateral upper limbs: Secondary | ICD-10-CM | POA: Diagnosis not present

## 2016-05-19 DIAGNOSIS — G603 Idiopathic progressive neuropathy: Secondary | ICD-10-CM | POA: Diagnosis not present

## 2016-05-19 DIAGNOSIS — G2581 Restless legs syndrome: Secondary | ICD-10-CM | POA: Diagnosis not present

## 2016-05-19 DIAGNOSIS — M5412 Radiculopathy, cervical region: Secondary | ICD-10-CM | POA: Diagnosis not present

## 2016-05-24 ENCOUNTER — Telehealth: Payer: Self-pay | Admitting: Family Medicine

## 2016-05-24 ENCOUNTER — Other Ambulatory Visit: Payer: Self-pay | Admitting: Family Medicine

## 2016-05-24 DIAGNOSIS — K219 Gastro-esophageal reflux disease without esophagitis: Secondary | ICD-10-CM

## 2016-05-24 DIAGNOSIS — R7303 Prediabetes: Secondary | ICD-10-CM

## 2016-05-24 DIAGNOSIS — Z79899 Other long term (current) drug therapy: Secondary | ICD-10-CM | POA: Diagnosis not present

## 2016-05-24 DIAGNOSIS — D508 Other iron deficiency anemias: Secondary | ICD-10-CM

## 2016-05-24 DIAGNOSIS — Z780 Asymptomatic menopausal state: Secondary | ICD-10-CM

## 2016-05-24 DIAGNOSIS — M8589 Other specified disorders of bone density and structure, multiple sites: Secondary | ICD-10-CM | POA: Diagnosis not present

## 2016-05-24 DIAGNOSIS — E782 Mixed hyperlipidemia: Secondary | ICD-10-CM

## 2016-05-24 DIAGNOSIS — I10 Essential (primary) hypertension: Secondary | ICD-10-CM

## 2016-05-24 DIAGNOSIS — E559 Vitamin D deficiency, unspecified: Secondary | ICD-10-CM | POA: Diagnosis not present

## 2016-05-24 NOTE — Telephone Encounter (Signed)
Labs ordered in prep for well exam Friday

## 2016-05-25 LAB — IRON AND TIBC
%SAT: 18 % (ref 11–50)
IRON: 103 ug/dL (ref 45–160)
TIBC: 569 ug/dL — ABNORMAL HIGH (ref 250–450)
UIBC: 466 ug/dL — ABNORMAL HIGH (ref 125–400)

## 2016-05-25 LAB — LIPID PANEL
CHOLESTEROL: 198 mg/dL (ref <200)
HDL: 44 mg/dL — ABNORMAL LOW (ref >50)
LDL Cholesterol: 119 mg/dL — ABNORMAL HIGH (ref <100)
TRIGLYCERIDES: 175 mg/dL — AB (ref <150)
Total CHOL/HDL Ratio: 4.5 Ratio (ref <5.0)
VLDL: 35 mg/dL — AB (ref <30)

## 2016-05-25 LAB — COMPLETE METABOLIC PANEL WITH GFR
ALBUMIN: 4.5 g/dL (ref 3.6–5.1)
ALK PHOS: 82 U/L (ref 33–130)
ALT: 10 U/L (ref 6–29)
AST: 12 U/L (ref 10–35)
BILIRUBIN TOTAL: 0.5 mg/dL (ref 0.2–1.2)
BUN: 23 mg/dL (ref 7–25)
CALCIUM: 9.7 mg/dL (ref 8.6–10.4)
CO2: 28 mmol/L (ref 20–31)
Chloride: 101 mmol/L (ref 98–110)
Creat: 1.02 mg/dL — ABNORMAL HIGH (ref 0.60–0.93)
GFR, EST AFRICAN AMERICAN: 64 mL/min (ref >=60)
GFR, EST NON AFRICAN AMERICAN: 56 mL/min — AB (ref >=60)
Glucose, Bld: 112 mg/dL — ABNORMAL HIGH (ref 65–99)
Potassium: 4.4 mmol/L (ref 3.5–5.3)
Sodium: 140 mmol/L (ref 135–146)
TOTAL PROTEIN: 7.5 g/dL (ref 6.1–8.1)

## 2016-05-25 LAB — CBC
HCT: 37.2 % (ref 35.0–45.0)
HEMOGLOBIN: 11.9 g/dL (ref 11.7–15.5)
MCH: 28.3 pg (ref 27.0–33.0)
MCHC: 32 g/dL (ref 32.0–36.0)
MCV: 88.6 fL (ref 80.0–100.0)
MPV: 12 fL (ref 7.5–12.5)
Platelets: 404 10*3/uL — ABNORMAL HIGH (ref 140–400)
RBC: 4.2 MIL/uL (ref 3.80–5.10)
RDW: 14.7 % (ref 11.0–15.0)
WBC: 10.9 10*3/uL — ABNORMAL HIGH (ref 3.8–10.8)

## 2016-05-25 LAB — FERRITIN: FERRITIN: 12 ng/mL — AB (ref 20–288)

## 2016-05-26 LAB — HEMOGLOBIN A1C
HEMOGLOBIN A1C: 6 % — AB (ref <5.7)
MEAN PLASMA GLUCOSE: 126 mg/dL

## 2016-05-26 LAB — HEPATITIS C ANTIBODY: HCV Ab: NEGATIVE

## 2016-05-26 LAB — VITAMIN D 25 HYDROXY (VIT D DEFICIENCY, FRACTURES): Vit D, 25-Hydroxy: 37 ng/mL (ref 30–100)

## 2016-05-28 ENCOUNTER — Ambulatory Visit: Payer: Medicare Other

## 2016-05-28 ENCOUNTER — Ambulatory Visit: Payer: Medicare Other | Admitting: Family Medicine

## 2016-06-02 ENCOUNTER — Ambulatory Visit (INDEPENDENT_AMBULATORY_CARE_PROVIDER_SITE_OTHER): Payer: Medicare Other | Admitting: Family Medicine

## 2016-06-02 ENCOUNTER — Encounter: Payer: Self-pay | Admitting: Family Medicine

## 2016-06-02 VITALS — BP 158/61 | HR 55 | Wt 155.0 lb

## 2016-06-02 DIAGNOSIS — R600 Localized edema: Secondary | ICD-10-CM | POA: Diagnosis not present

## 2016-06-02 DIAGNOSIS — I10 Essential (primary) hypertension: Secondary | ICD-10-CM | POA: Diagnosis not present

## 2016-06-02 DIAGNOSIS — R1310 Dysphagia, unspecified: Secondary | ICD-10-CM

## 2016-06-02 DIAGNOSIS — E782 Mixed hyperlipidemia: Secondary | ICD-10-CM | POA: Diagnosis not present

## 2016-06-02 DIAGNOSIS — K219 Gastro-esophageal reflux disease without esophagitis: Secondary | ICD-10-CM

## 2016-06-02 DIAGNOSIS — J0101 Acute recurrent maxillary sinusitis: Secondary | ICD-10-CM

## 2016-06-02 MED ORDER — FLUTICASONE PROPIONATE 50 MCG/ACT NA SUSP: 2.0000 | Freq: Every day | NASAL | 3 refills | Status: DC

## 2016-06-02 MED ORDER — ATORVASTATIN CALCIUM 10 MG PO TABS
10.0000 mg | ORAL_TABLET | Freq: Every day | ORAL | 0 refills | Status: DC
Start: 2016-06-02 — End: 2016-07-21

## 2016-06-02 MED ORDER — AZITHROMYCIN 250 MG PO TABS: 250.0000 mg | ORAL_TABLET | Freq: Every day | ORAL | 0 refills | Status: DC

## 2016-06-02 NOTE — Patient Instructions (Signed)
Thank you for coming in today. STOP Gemfibrozil. START 1 kirill oil a day Take 1 lipitor a day.  USe compression stockings in the morning to prevent leg swelling.  You should hear form [facility] soon about the swallowing.    Call or go to the emergency room if you get worse, have trouble breathing, have chest pains, or palpitations.    Recheck in 3 months or sooner if needed.

## 2016-06-02 NOTE — Progress Notes (Signed)
Cynthia Adams is a 71 y.o. female who presents to [place]: [place] today for headache, dysphagia, lower extremity swelling, hyperlipidemia.  Headache: Patient notes a one-month history of right-sided headache and facial pain and pressure occurring off and on worsening 2 days ago. She notes nasal discharge and right tooth pain. She has a history of recurrent sinus infection is and is concerned she may have a sinus infection today. No chest pain palpitations or shortness of breath.  Dysphagia: Patient notes a several month history of food getting stuck with swallowing. She notes that she is able to drink liquids without any problems but larger food or very dry food can sometimes get stuck. She has to conscientiously very slowly and avoid large amounts of food at one time. She denies any food getting stuck permanently requiring medical attention. She denies any choking or aspiration. She notes she has a long-term history of acid reflux requiring Protonix.  Lower extremity swelling: Patient has a several year history of bilateral lower extremity swelling occurring intermittently. She denies chest pain palpitations orthopnea or shortness of breath. She notes the swelling occurs after she's been standing or seated for a long period of time and is typically better in the mornings. She does not want to take medications for this if possible.  Hyperlipidemia: Patient has a history of hypertriglyceridemia and hyperlipidemia. She's been taking gemfibrozil for sometime now. She notes intermittent stomach discomfort and nausea with this medication.    Past Medical History:  Diagnosis Date  . Cataract 2017   Cataract surgery in 01-2016 and 02-2016  . GERD (gastroesophageal reflux disease)   . Hyperlipidemia   . Hypertension   . Nerve pain   . Seasonal allergies    Past Surgical History:    Procedure Laterality Date  . ABDOMINAL HYSTERECTOMY    . CATARACT EXTRACTION W/ INTRAOCULAR LENS  IMPLANT, BILATERAL Bilateral 01-2016, 02-2016  . CATARACT EXTRACTION W/ INTRAOCULAR LENS & ANTERIOR VITRECTOMY Bilateral 01-29-2016, 02-19-2016   Right eye in 01-2016 and Left eye 02-2016  . CESAREAN SECTION    . CESAREAN SECTION    . CHOLECYSTECTOMY    . LUMBAR DISC SURGERY    . TONSILLECTOMY     Social History  Substance Use Topics  . Smoking status: Never Smoker  . Smokeless tobacco: Never Used  . Alcohol use Yes     Comment: may have a glass of wine but very, very seldom.   family history includes Alcohol abuse in her paternal uncle and paternal uncle; Cancer in her mother and paternal uncle; Dementia in her maternal aunt, maternal aunt, maternal aunt, mother, and sister; Heart disease in her paternal grandmother; Hyperlipidemia in her brother, mother, sister, sister, and sister; Hypertension in her brother, mother, sister, sister, sister, and son; Stroke in her maternal grandmother.  ROS as above:  Medications: Current Outpatient Prescriptions  Medication Sig Dispense Refill  . aspirin 81 MG tablet Take 81 mg by mouth daily.    . Cyanocobalamin (B-12 PO) Take by mouth.    . estradiol (ESTRACE) 0.5 MG tablet Take 0.5 tablets (0.25 mg total) by mouth daily. 45 tablet 3  . fluticasone (FLONASE) 50 MCG/ACT nasal spray Place 2 sprays into both nostrils daily. 48 g 3  . gabapentin (NEURONTIN) 600 MG tablet Take 1 tablet (600 mg total) by mouth 3 (three) times daily. 270 tablet 1  . HYDROcodone-acetaminophen (NORCO) 10-325 MG tablet Take by mouth.    Marland Kitchen  loratadine (CLARITIN) 10 MG tablet Take 10 mg by mouth daily.    . metoprolol (LOPRESSOR) 100 MG tablet Take 1 tablet (100 mg total) by mouth 2 (two) times daily. 180 tablet 1  . MULTIPLE VITAMIN PO Take by mouth.    . pantoprazole (PROTONIX) 40 MG tablet Take 1 tablet (40 mg total) by mouth daily. 90 tablet 1  . atorvastatin (LIPITOR)  10 MG tablet Take 1 tablet (10 mg total) by mouth daily. 90 tablet 0  . azithromycin (ZITHROMAX) 250 MG tablet Take 1 tablet (250 mg total) by mouth daily. Take first 2 tablets together, then 1 every day until finished. 6 tablet 0   No current facility-administered medications for this visit.    Allergies  Allergen Reactions  . Amoxicillin-Pot Clavulanate   . Minocycline   . Sulfa Antibiotics     Health Maintenance Health Maintenance  Topic Date Due  . PNA vac Low Risk Adult (2 of 2 - PPSV23) 02/25/2017  . COLONOSCOPY  06/10/2017  . MAMMOGRAM  03/17/2018  . TETANUS/TDAP  08/15/2024  . INFLUENZA VACCINE  Completed  . DEXA SCAN  Completed  . ZOSTAVAX  Completed  . Hepatitis C Screening  Completed     Exam:  BP (!) 158/61   Pulse (!) 55   Wt 155 lb (70.3 kg)   BMI 27.46 kg/m  Gen: Well NAD HEENT: EOMI,  MMM No goiter. Tender palpation right maxillary sinus. Clear discharge is present. Normal tympanic membranes bilaterally.  Lungs: Normal work of breathing. CTABL Heart: RRR no MRG Abd: NABS, Soft. Nondistended, Nontender Exts: Brisk capillary refill, warm and well perfused. No edema today. No palpable cord or varicosities present    Chemistry      Component Value Date/Time   NA 140 05/24/2016 0843   K 4.4 05/24/2016 0843   CL 101 05/24/2016 0843   CO2 28 05/24/2016 0843   BUN 23 05/24/2016 0843   CREATININE 1.02 (H) 05/24/2016 0843      Component Value Date/Time   CALCIUM 9.7 05/24/2016 0843   ALKPHOS 82 05/24/2016 0843   AST 12 05/24/2016 0843   ALT 10 05/24/2016 0843   BILITOT 0.5 05/24/2016 0843     Lab Results  Component Value Date   CHOL 198 05/24/2016   HDL 44 (L) 05/24/2016   LDLCALC 119 (H) 05/24/2016   TRIG 175 (H) 05/24/2016   CHOLHDL 4.5 05/24/2016      No results found for this or any previous visit (from the past 72 hour(s)). No results found.    Assessment and Plan: 71 y.o. female with  Facial pain and pressure concerning for  bacterial sinusitis. Treat empirically with azithromycin and watchful waiting.  Dysphagia: Concerning for esophageal stenosis or stricture. Plan to refer to [facility] neurology for further evaluation and management and likely upper endoscopy.  Foot swelling: Likely dependent edema. Recommend compression stockings. Recently creatinine was normal.  Hyperlipidemia: Not at goal. Recommend discontinuation of gemfibrozil and starting low-dose Lipitor and Krill oil. Recheck in a few months.   Orders Placed This Encounter  Procedures  . Ambulatory referral to Gastroenterology    Referral Priority:   Routine    Referral Type:   Consultation    Referral Reason:   Specialty Services Required    Requested Specialty:   Gastroenterology    Number of Visits Requested:   1    Discussed warning signs or symptoms. Please see discharge instructions. Patient expresses understanding.  I spent 40 minutes with this  patient, greater than 50% was face-to-face time counseling regarding the above diagnosis.

## 2016-06-09 ENCOUNTER — Other Ambulatory Visit: Payer: Self-pay | Admitting: Family Medicine

## 2016-06-09 ENCOUNTER — Telehealth: Payer: Self-pay

## 2016-06-09 MED ORDER — GEMFIBROZIL 600 MG PO TABS: 600.0000 mg | ORAL_TABLET | Freq: Two times a day (BID) | ORAL | 1 refills | Status: DC

## 2016-06-09 MED ORDER — GABAPENTIN 600 MG PO TABS: 600.0000 mg | ORAL_TABLET | Freq: Three times a day (TID) | ORAL | 1 refills | Status: DC

## 2016-06-09 NOTE — Telephone Encounter (Signed)
Azithromycin is typically given for 5 days but lasts for 10-12 days. I would recommend holding on further antibiotics at this time.

## 2016-06-09 NOTE — Telephone Encounter (Signed)
Pt left a vm stating that she has had improved yet continued symptoms after completing the course if azithromycin. She would like another round of abx sent in if appropriate. Please advise.

## 2016-06-09 NOTE — Telephone Encounter (Signed)
Information discussed with pt. Pt verbalized understanding. 

## 2016-06-17 DIAGNOSIS — R1310 Dysphagia, unspecified: Secondary | ICD-10-CM | POA: Diagnosis not present

## 2016-06-17 DIAGNOSIS — R140 Abdominal distension (gaseous): Secondary | ICD-10-CM | POA: Diagnosis not present

## 2016-06-17 DIAGNOSIS — K219 Gastro-esophageal reflux disease without esophagitis: Secondary | ICD-10-CM | POA: Diagnosis not present

## 2016-07-15 DIAGNOSIS — K2950 Unspecified chronic gastritis without bleeding: Secondary | ICD-10-CM | POA: Diagnosis not present

## 2016-07-15 DIAGNOSIS — K219 Gastro-esophageal reflux disease without esophagitis: Secondary | ICD-10-CM | POA: Diagnosis not present

## 2016-07-15 DIAGNOSIS — K222 Esophageal obstruction: Secondary | ICD-10-CM | POA: Diagnosis not present

## 2016-07-15 DIAGNOSIS — R1310 Dysphagia, unspecified: Secondary | ICD-10-CM | POA: Diagnosis not present

## 2016-07-15 DIAGNOSIS — K2960 Other gastritis without bleeding: Secondary | ICD-10-CM | POA: Diagnosis not present

## 2016-07-16 ENCOUNTER — Other Ambulatory Visit: Payer: Self-pay | Admitting: Family Medicine

## 2016-07-21 ENCOUNTER — Other Ambulatory Visit: Payer: Self-pay | Admitting: Family Medicine

## 2016-07-21 DIAGNOSIS — M542 Cervicalgia: Secondary | ICD-10-CM | POA: Diagnosis not present

## 2016-07-21 DIAGNOSIS — G2581 Restless legs syndrome: Secondary | ICD-10-CM | POA: Diagnosis not present

## 2016-07-21 DIAGNOSIS — E782 Mixed hyperlipidemia: Secondary | ICD-10-CM

## 2016-07-21 DIAGNOSIS — G603 Idiopathic progressive neuropathy: Secondary | ICD-10-CM | POA: Diagnosis not present

## 2016-07-21 DIAGNOSIS — G5603 Carpal tunnel syndrome, bilateral upper limbs: Secondary | ICD-10-CM | POA: Diagnosis not present

## 2016-07-21 DIAGNOSIS — M5417 Radiculopathy, lumbosacral region: Secondary | ICD-10-CM | POA: Diagnosis not present

## 2016-08-25 ENCOUNTER — Encounter: Payer: Self-pay | Admitting: Family Medicine

## 2016-08-25 ENCOUNTER — Ambulatory Visit (INDEPENDENT_AMBULATORY_CARE_PROVIDER_SITE_OTHER): Payer: Medicare Other | Admitting: Family Medicine

## 2016-08-25 VITALS — BP 149/65 | HR 53 | Wt 158.0 lb

## 2016-08-25 DIAGNOSIS — E782 Mixed hyperlipidemia: Secondary | ICD-10-CM | POA: Diagnosis not present

## 2016-08-25 MED ORDER — PANTOPRAZOLE SODIUM 40 MG PO TBEC: 40.0000 mg | DELAYED_RELEASE_TABLET | Freq: Two times a day (BID) | ORAL | 1 refills | Status: DC

## 2016-08-25 MED ORDER — PRAVASTATIN SODIUM 20 MG PO TABS: 20.0000 mg | ORAL_TABLET | Freq: Every day | ORAL | 0 refills | Status: DC

## 2016-08-25 MED ORDER — GEMFIBROZIL 600 MG PO TABS: 600.0000 mg | ORAL_TABLET | Freq: Two times a day (BID) | ORAL | 1 refills | Status: DC

## 2016-08-25 NOTE — Progress Notes (Signed)
Cynthia Adams is a 71 y.o. female who presents to [place]: [place] today for follow-up of hyperlipidemia. Patient was seen about 3 months ago for hyperlipidemia. She was advised to stop gemfibrozil and start atorvastatin and fish oil. She did not tolerate atorvastatin as it caused diarrhea and fatigue. Additionally she did not tolerate fish oil because it made her smell fishy. She has not restarted gemfibrozil. She denies fevers or chills vomiting or diarrhea.   Past Medical History:  Diagnosis Date  . Cataract 2017   Cataract surgery in 01-2016 and 02-2016  . GERD (gastroesophageal reflux disease)   . Hyperlipidemia   . Hypertension   . Nerve pain   . Seasonal allergies    Past Surgical History:  Procedure Laterality Date  . ABDOMINAL HYSTERECTOMY    . CATARACT EXTRACTION W/ INTRAOCULAR LENS  IMPLANT, BILATERAL Bilateral 01-2016, 02-2016  . CATARACT EXTRACTION W/ INTRAOCULAR LENS & ANTERIOR VITRECTOMY Bilateral 01-29-2016, 02-19-2016   Right eye in 01-2016 and Left eye 02-2016  . CESAREAN SECTION    . CESAREAN SECTION    . CHOLECYSTECTOMY    . LUMBAR DISC SURGERY    . TONSILLECTOMY     Social History  Substance Use Topics  . Smoking status: Never Smoker  . Smokeless tobacco: Never Used  . Alcohol use Yes     Comment: may have a glass of wine but very, very seldom.   family history includes Alcohol abuse in her paternal uncle and paternal uncle; Cancer in her mother and paternal uncle; Dementia in her maternal aunt, maternal aunt, maternal aunt, mother, and sister; Heart disease in her paternal grandmother; Hyperlipidemia in her brother, mother, sister, sister, and sister; Hypertension in her brother, mother, sister, sister, sister, and son; Stroke in her maternal grandmother.  ROS as above:  Medications: Current Outpatient Prescriptions  Medication Sig  Dispense Refill  . aspirin 81 MG tablet Take 81 mg by mouth daily.    . Cyanocobalamin (B-12 PO) Take by mouth.    . estradiol (ESTRACE) 0.5 MG tablet Take 0.5 tablets (0.25 mg total) by mouth daily. 45 tablet 3  . fluticasone (FLONASE) 50 MCG/ACT nasal spray Place 2 sprays into both nostrils daily. 48 g 3  . gabapentin (NEURONTIN) 600 MG tablet Take 1 tablet (600 mg total) by mouth 3 (three) times daily. 270 tablet 1  . HYDROcodone-acetaminophen (NORCO) 10-325 MG tablet Take by mouth.    . loratadine (CLARITIN) 10 MG tablet Take 10 mg by mouth daily.    . metoprolol (LOPRESSOR) 100 MG tablet TAKE 1 TABLET BY MOUTH TWO  TIMES DAILY 180 tablet 0  . MULTIPLE VITAMIN PO Take by mouth.    . pantoprazole (PROTONIX) 40 MG tablet Take 1 tablet (40 mg total) by mouth daily. 90 tablet 1  . gemfibrozil (LOPID) 600 MG tablet Take 1 tablet (600 mg total) by mouth 2 (two) times daily before a meal. 180 tablet 1  . pravastatin (PRAVACHOL) 20 MG tablet Take 1 tablet (20 mg total) by mouth daily. 90 tablet 0   No current facility-administered medications for this visit.    Allergies  Allergen Reactions  . Amoxicillin-Pot Clavulanate   . Minocycline   . Sulfa Antibiotics   . Lipitor [Atorvastatin] Other (See Comments)    Diarrhea and insomnia    Health Maintenance Health Maintenance  Topic Date Due  . INFLUENZA VACCINE  12/08/2016  . PNA vac Low Risk Adult (2  of 2 - PPSV23) 02/25/2017  . COLONOSCOPY  06/10/2017  . MAMMOGRAM  03/17/2018  . TETANUS/TDAP  08/15/2024  . DEXA SCAN  Completed  . Hepatitis C Screening  Completed     Exam:  BP (!) 149/65   Pulse (!) 53   Wt 158 lb (71.7 kg)   BMI 27.99 kg/m  Gen: Well NAD HEENT: EOMI,  MMM Lungs: Normal work of breathing. CTABL Heart: RRR no MRG Abd: NABS, Soft. Nondistended, Nontender Exts: Brisk capillary refill, warm and well perfused.    No results found for this or any previous visit (from the past 72 hour(s)). No results  found.    Assessment and Plan: 71 y.o. female with  Hyperlipidemia not well controlled.   will try pravastatin and Krill oil. If not better or unable to tolerate pravastatin will try gemfibrozil instead. Recheck 3 months.  No orders of the defined types were placed in this encounter.  Meds ordered this encounter  Medications  . gemfibrozil (LOPID) 600 MG tablet    Sig: Take 1 tablet (600 mg total) by mouth 2 (two) times daily before a meal.    Dispense:  180 tablet    Refill:  1  . pravastatin (PRAVACHOL) 20 MG tablet    Sig: Take 1 tablet (20 mg total) by mouth daily.    Dispense:  90 tablet    Refill:  0     Discussed warning signs or symptoms. Please see discharge instructions. Patient expresses understanding.

## 2016-08-25 NOTE — Patient Instructions (Addendum)
Thank you for coming in today. Try pravastatin.  If you cannot take pravastatin take gemfibrozil.  Recheck in 3 months.   I recommend water aerobics for exercise or walking.  Try to stay active. This will likely help you back as well as reduce risk of falls and improve cholesterol and prediabetes.    You are doing great!  Keep a home blood pressure log and let me know what it is.   Pravastatin tablets What is this medicine? PRAVASTATIN (PRA va stat in) is known as a HMG-CoA reductase inhibitor or 'statin'. It lowers the level of cholesterol and triglycerides in the blood. This drug may also reduce the risk of heart attack, stroke, or other health problems in patients with risk factors for heart disease. Diet and lifestyle changes are often used with this drug. This medicine may be used for other purposes; ask your health care provider or pharmacist if you have questions. COMMON BRAND NAME(S): Pravachol What should I tell my health care provider before I take this medicine? They need to know if you have any of these conditions: -frequently drink alcoholic beverages -kidney disease -liver disease -muscle aches or weakness -other medical condition -an unusual or allergic reaction to pravastatin, other medicines, foods, dyes, or preservatives -pregnant or trying to get pregnant -breast-feeding How should I use this medicine? Take pravastatin tablets by mouth. Swallow the tablets with a drink of water. Pravastatin can be taken at anytime of the day, with or without food. Follow the directions on the prescription label. Take your doses at regular intervals. Do not take your medicine more often than directed. Talk to your pediatrician regarding the use of this medicine in children. Special care may be needed. Pravastatin has been used in children as young as 36 years of age. Overdosage: If you think you have taken too much of this medicine contact a poison control center or emergency room at  once. NOTE: This medicine is only for you. Do not share this medicine with others. What if I miss a dose? If you miss a dose, take it as soon as you can. If it is almost time for your next dose, take only that dose. Do not take double or extra doses. What may interact with this medicine? This medicine may interact with the following medications: -colchicine -cyclosporine -other medicines for high cholesterol -some antibiotics like azithromycin, clarithromycin, erythromycin, and telithromycin This list may not describe all possible interactions. Give your health care provider a list of all the medicines, herbs, non-prescription drugs, or dietary supplements you use. Also tell them if you smoke, drink alcohol, or use illegal drugs. Some items may interact with your medicine. What should I watch for while using this medicine? Visit your doctor or health care professional for regular check-ups. You may need regular tests to make sure your liver is working properly. Tell your doctor or health care professional right away if you get any unexplained muscle pain, tenderness, or weakness, especially if you also have a fever and tiredness. Your doctor or health care professional may tell you to stop taking this medicine if you develop muscle problems. If your muscle problems do not go away after stopping this medicine, contact your health care professional. This drug is only part of a total heart-health program. Your doctor or a dietician can suggest a low-cholesterol and low-fat diet to help. Avoid alcohol and smoking, and keep a proper exercise schedule. Do not use this drug if you are pregnant or breast-feeding. Serious side  effects to an unborn child or to an infant are possible. Talk to your doctor or pharmacist for more information. This medicine may affect blood sugar levels. If you have diabetes, check with your doctor or health care professional before you change your diet or the dose of your diabetic  medicine. If you are going to have surgery tell your health care professional that you are taking this drug. What side effects may I notice from receiving this medicine? Side effects that you should report to your doctor or health care professional as soon as possible: -allergic reactions like skin rash, itching or hives, swelling of the face, lips, or tongue -dark urine -fever -muscle pain, cramps, or weakness -redness, blistering, peeling or loosening of the skin, including inside the mouth -trouble passing urine or change in the amount of urine -unusually weak or tired -yellowing of the eyes or skin Side effects that usually do not require medical attention (report to your doctor or health care professional if they continue or are bothersome): -gas -headache -heartburn -indigestion -stomach pain This list may not describe all possible side effects. Call your doctor for medical advice about side effects. You may report side effects to FDA at 1-800-FDA-1088. Where should I keep my medicine? Keep out of the reach of children. Store at room temperature between 15 to 30 degrees C (59 to 86 degrees F). Protect from light. Keep container tightly closed. Throw away any unused medicine after the expiration date. NOTE: This sheet is a summary. It may not cover all possible information. If you have questions about this medicine, talk to your doctor, pharmacist, or health care provider.  2018 Elsevier/Gold Standard (2014-11-21 16:00:27)

## 2016-09-01 ENCOUNTER — Ambulatory Visit: Payer: Medicare Other | Admitting: Family Medicine

## 2016-09-16 DIAGNOSIS — R1011 Right upper quadrant pain: Secondary | ICD-10-CM | POA: Diagnosis not present

## 2016-09-16 DIAGNOSIS — K219 Gastro-esophageal reflux disease without esophagitis: Secondary | ICD-10-CM | POA: Diagnosis not present

## 2016-09-22 ENCOUNTER — Other Ambulatory Visit: Payer: Self-pay | Admitting: Family Medicine

## 2016-09-30 DIAGNOSIS — K7689 Other specified diseases of liver: Secondary | ICD-10-CM | POA: Diagnosis not present

## 2016-09-30 DIAGNOSIS — R1011 Right upper quadrant pain: Secondary | ICD-10-CM | POA: Diagnosis not present

## 2016-09-30 DIAGNOSIS — K760 Fatty (change of) liver, not elsewhere classified: Secondary | ICD-10-CM | POA: Diagnosis not present

## 2016-09-30 DIAGNOSIS — N281 Cyst of kidney, acquired: Secondary | ICD-10-CM | POA: Diagnosis not present

## 2016-10-13 ENCOUNTER — Other Ambulatory Visit: Payer: Self-pay | Admitting: Family Medicine

## 2016-10-14 DIAGNOSIS — G603 Idiopathic progressive neuropathy: Secondary | ICD-10-CM | POA: Diagnosis not present

## 2016-10-14 DIAGNOSIS — G5603 Carpal tunnel syndrome, bilateral upper limbs: Secondary | ICD-10-CM | POA: Diagnosis not present

## 2016-10-14 DIAGNOSIS — M5417 Radiculopathy, lumbosacral region: Secondary | ICD-10-CM | POA: Diagnosis not present

## 2016-10-14 DIAGNOSIS — M542 Cervicalgia: Secondary | ICD-10-CM | POA: Diagnosis not present

## 2016-10-14 DIAGNOSIS — G2581 Restless legs syndrome: Secondary | ICD-10-CM | POA: Diagnosis not present

## 2016-10-26 ENCOUNTER — Other Ambulatory Visit: Payer: Self-pay | Admitting: Family Medicine

## 2016-10-29 ENCOUNTER — Other Ambulatory Visit: Payer: Self-pay

## 2016-10-29 ENCOUNTER — Telehealth: Payer: Self-pay | Admitting: Family Medicine

## 2016-10-29 MED ORDER — GEMFIBROZIL 600 MG PO TABS: 600.0000 mg | ORAL_TABLET | Freq: Two times a day (BID) | ORAL | 1 refills | Status: DC

## 2016-10-29 NOTE — Telephone Encounter (Signed)
Patient called adv that her refill request has not been responded to per contacting [facility] and she has been taking gemfibrozil because the pravastatin you prescribed was hurting her stomach and giving her diarrhea all the side effects so went back to gemfibrozil and needs a refill of this medicine sent to [facility]. Thanks

## 2016-11-01 NOTE — Telephone Encounter (Signed)
The Gemfibrozil was sent to mail order. Please see note below.

## 2016-11-24 ENCOUNTER — Encounter: Payer: Self-pay | Admitting: Family Medicine

## 2016-11-24 ENCOUNTER — Ambulatory Visit (INDEPENDENT_AMBULATORY_CARE_PROVIDER_SITE_OTHER): Payer: Medicare Other | Admitting: Family Medicine

## 2016-11-24 VITALS — BP 136/61 | HR 58 | Ht 63.0 in | Wt 161.0 lb

## 2016-11-24 DIAGNOSIS — D508 Other iron deficiency anemias: Secondary | ICD-10-CM

## 2016-11-24 DIAGNOSIS — I10 Essential (primary) hypertension: Secondary | ICD-10-CM | POA: Diagnosis not present

## 2016-11-24 DIAGNOSIS — K219 Gastro-esophageal reflux disease without esophagitis: Secondary | ICD-10-CM

## 2016-11-24 DIAGNOSIS — E782 Mixed hyperlipidemia: Secondary | ICD-10-CM | POA: Diagnosis not present

## 2016-11-24 DIAGNOSIS — M48061 Spinal stenosis, lumbar region without neurogenic claudication: Secondary | ICD-10-CM | POA: Diagnosis not present

## 2016-11-24 LAB — CBC
HEMATOCRIT: 33.1 % — AB (ref 35.0–45.0)
HEMOGLOBIN: 10.9 g/dL — AB (ref 11.7–15.5)
MCH: 30.3 pg (ref 27.0–33.0)
MCHC: 32.9 g/dL (ref 32.0–36.0)
MCV: 91.9 fL (ref 80.0–100.0)
MPV: 11.5 fL (ref 7.5–12.5)
Platelets: 302 10*3/uL (ref 140–400)
RBC: 3.6 MIL/uL — AB (ref 3.80–5.10)
RDW: 14.8 % (ref 11.0–15.0)
WBC: 6.6 10*3/uL (ref 3.8–10.8)

## 2016-11-24 NOTE — Progress Notes (Signed)
Cynthia Adams is a 71 y.o. female who presents to [place]: [place] today for follow-up hypertension and hyperlipidemia. Patient was seen in April for hyperlipidemia. She had been intolerant to other statins and had a trial of pravastatin. She notes that she could not tolerate this medication as it causes diarrhea. She was switched back to gemfibrozil which she tolerates much better.  Hypertension: Cynthia Adams takes medications listed below. She just took her blood pressure medicines before arriving in notes her pressures may be a bit higher today. Typically she notes her blood pressures are well controlled at other doctor's offices and at home. She denies chest pain palpitations or shortness of breath.  Chronic pain: Patient has chronic back pain related to spinal stenosis. This is managed via pain management with hydrocodone and gabapentin. This works well to control her pain.   Iron deficiency anemia: Patient has a history of iron deficiency anemia. She thinks that she is due for labs although she feels quite well.  Past Medical History:  Diagnosis Date  . Cataract 2017   Cataract surgery in 01-2016 and 02-2016  . GERD (gastroesophageal reflux disease)   . Hyperlipidemia   . Hypertension   . Nerve pain   . Seasonal allergies    Past Surgical History:  Procedure Laterality Date  . ABDOMINAL HYSTERECTOMY    . CATARACT EXTRACTION W/ INTRAOCULAR LENS  IMPLANT, BILATERAL Bilateral 01-2016, 02-2016  . CATARACT EXTRACTION W/ INTRAOCULAR LENS & ANTERIOR VITRECTOMY Bilateral 01-29-2016, 02-19-2016   Right eye in 01-2016 and Left eye 02-2016  . CESAREAN SECTION    . CESAREAN SECTION    . CHOLECYSTECTOMY    . LUMBAR DISC SURGERY    . TONSILLECTOMY     Social History  Substance Use Topics  . Smoking status: Never Smoker  . Smokeless tobacco: Never Used  . Alcohol use Yes   Comment: may have a glass of wine but very, very seldom.   family history includes Alcohol abuse in her paternal uncle and paternal uncle; Cancer in her mother and paternal uncle; Dementia in her maternal aunt, maternal aunt, maternal aunt, mother, and sister; Heart disease in her paternal grandmother; Hyperlipidemia in her brother, mother, sister, sister, and sister; Hypertension in her brother, mother, sister, sister, sister, and son; Stroke in her maternal grandmother.  ROS as above:  Medications: Current Outpatient Prescriptions  Medication Sig Dispense Refill  . aspirin 81 MG tablet Take 81 mg by mouth daily.    . Cyanocobalamin (B-12 PO) Take by mouth.    . estradiol (ESTRACE) 0.5 MG tablet Take 0.5 tablets (0.25 mg total) by mouth daily. 45 tablet 3  . fluticasone (FLONASE) 50 MCG/ACT nasal spray Place 2 sprays into both nostrils daily. 48 g 3  . gabapentin (NEURONTIN) 600 MG tablet TAKE 1 TABLET BY MOUTH 3  TIMES DAILY 270 tablet 1  . gemfibrozil (LOPID) 600 MG tablet Take 1 tablet (600 mg total) by mouth 2 (two) times daily before a meal. 180 tablet 1  . HYDROcodone-acetaminophen (NORCO) 10-325 MG tablet Take by mouth.    . loratadine (CLARITIN) 10 MG tablet Take 10 mg by mouth daily.    . metoprolol tartrate (LOPRESSOR) 100 MG tablet TAKE 1 TABLET BY MOUTH TWO  TIMES DAILY 180 tablet 0  . MULTIPLE VITAMIN PO Take by mouth.    . pantoprazole (PROTONIX) 40 MG tablet Take 1 tablet (40 mg total) by mouth 2 (two) times daily.  For esophagitis 180 tablet 1   No current facility-administered medications for this visit.    Allergies  Allergen Reactions  . Amoxicillin-Pot Clavulanate   . Minocycline   . Pravastatin Diarrhea  . Sulfa Antibiotics   . Lipitor [Atorvastatin] Other (See Comments)    Diarrhea and insomnia    Health Maintenance Health Maintenance  Topic Date Due  . INFLUENZA VACCINE  12/08/2016  . PNA vac Low Risk Adult (2 of 2 - PPSV23) 02/25/2017  . COLONOSCOPY   06/10/2017  . MAMMOGRAM  03/17/2018  . TETANUS/TDAP  08/15/2024  . DEXA SCAN  Completed  . Hepatitis C Screening  Completed     Exam:  BP 136/61   Pulse (!) 58   Ht 5\' 3"  (1.6 m)   Wt 161 lb (73 kg)   BMI 28.52 kg/m  Gen: Well NAD HEENT: EOMI,  MMM Lungs: Normal work of breathing. CTABL Heart: RRR no MRG Abd: NABS, Soft. Nondistended, Nontender Exts: Brisk capillary refill, warm and well perfused.    No results found for this or any previous visit (from the past 72 hour(s)). No results found.    Assessment and Plan: 71 y.o. female with  Hyperlipidemia: Lipids were mildly elevated at last check. She cannot tolerate statins. I think is reasonable just to continue gemfibrozil and recheck lipids yearly.  Hypertension: Blood pressure at goal. Plan to continue current regimen and recheck metabolic panel today.  History of iron deficiency anemia: Doing quite well recheck values today.   Chronic pain doing well recommended daily exercise.   Orders Placed This Encounter  Procedures  . CBC  . COMPLETE METABOLIC PANEL WITH GFR  . Ferritin  . Iron and TIBC   No orders of the defined types were placed in this encounter.    Discussed warning signs or symptoms. Please see discharge instructions. Patient expresses understanding.

## 2016-11-24 NOTE — Patient Instructions (Signed)
Thank you for coming in today. Get labs today.  Recheck every 6 months Return sooner if needed.  Work on daily walking exercises.

## 2016-11-25 LAB — COMPLETE METABOLIC PANEL WITH GFR
ALBUMIN: 4.4 g/dL (ref 3.6–5.1)
ALK PHOS: 91 U/L (ref 33–130)
ALT: 13 U/L (ref 6–29)
AST: 21 U/L (ref 10–35)
BILIRUBIN TOTAL: 0.3 mg/dL (ref 0.2–1.2)
BUN: 13 mg/dL (ref 7–25)
CALCIUM: 9.1 mg/dL (ref 8.6–10.4)
CO2: 22 mmol/L (ref 20–31)
CREATININE: 1.03 mg/dL — AB (ref 0.60–0.93)
Chloride: 104 mmol/L (ref 98–110)
GFR, EST AFRICAN AMERICAN: 64 mL/min (ref >=60)
GFR, Est Non African American: 55 mL/min — ABNORMAL LOW (ref >=60)
Glucose, Bld: 100 mg/dL — ABNORMAL HIGH (ref 65–99)
Potassium: 4.2 mmol/L (ref 3.5–5.3)
Sodium: 140 mmol/L (ref 135–146)
TOTAL PROTEIN: 6.9 g/dL (ref 6.1–8.1)

## 2016-11-25 LAB — FERRITIN: FERRITIN: 11 ng/mL — AB (ref 20–288)

## 2016-11-25 LAB — IRON AND TIBC
%SAT: 27 % (ref 11–50)
IRON: 143 ug/dL (ref 45–160)
TIBC: 531 ug/dL — ABNORMAL HIGH (ref 250–450)
UIBC: 388 ug/dL

## 2016-12-10 ENCOUNTER — Other Ambulatory Visit: Payer: Self-pay | Admitting: Family Medicine

## 2016-12-22 DIAGNOSIS — K219 Gastro-esophageal reflux disease without esophagitis: Secondary | ICD-10-CM | POA: Diagnosis not present

## 2016-12-22 DIAGNOSIS — R1011 Right upper quadrant pain: Secondary | ICD-10-CM | POA: Diagnosis not present

## 2016-12-31 ENCOUNTER — Other Ambulatory Visit: Payer: Self-pay | Admitting: Family Medicine

## 2017-01-19 DIAGNOSIS — G603 Idiopathic progressive neuropathy: Secondary | ICD-10-CM | POA: Diagnosis not present

## 2017-01-19 DIAGNOSIS — M542 Cervicalgia: Secondary | ICD-10-CM | POA: Diagnosis not present

## 2017-01-19 DIAGNOSIS — G2581 Restless legs syndrome: Secondary | ICD-10-CM | POA: Diagnosis not present

## 2017-03-03 ENCOUNTER — Other Ambulatory Visit: Payer: Self-pay

## 2017-03-03 NOTE — Patient Outreach (Signed)
[facility]) Care Management  03/03/2017  SYA NESTLER April 09, 1946 [id number]   Medication adherence call to Mrs. Cynthia Adams the reason for this call is because Mrs. Disbro is showing past due under [facility]on Pravastatin 20 mg spoke to patient she said she is no longer taking this medication she was take off because of side effects.   [facility] Direct Dial [phone]  Fax [phone] [email or web address]

## 2017-03-07 ENCOUNTER — Other Ambulatory Visit: Payer: Self-pay | Admitting: Family Medicine

## 2017-03-07 DIAGNOSIS — Z139 Encounter for screening, unspecified: Secondary | ICD-10-CM

## 2017-03-08 ENCOUNTER — Other Ambulatory Visit: Payer: Self-pay | Admitting: Family Medicine

## 2017-04-04 ENCOUNTER — Ambulatory Visit
Admission: RE | Admit: 2017-04-04 | Discharge: 2017-04-04 | Disposition: A | Discharge status: Home / Self Care | Payer: Medicare Other | Source: Ambulatory Visit | Attending: Family Medicine | Admitting: Family Medicine

## 2017-04-04 DIAGNOSIS — Z139 Encounter for screening, unspecified: Secondary | ICD-10-CM

## 2017-04-04 DIAGNOSIS — Z1231 Encounter for screening mammogram for malignant neoplasm of breast: Secondary | ICD-10-CM | POA: Diagnosis not present

## 2017-04-11 DIAGNOSIS — H527 Unspecified disorder of refraction: Secondary | ICD-10-CM | POA: Diagnosis not present

## 2017-04-20 DIAGNOSIS — M5417 Radiculopathy, lumbosacral region: Secondary | ICD-10-CM | POA: Diagnosis not present

## 2017-04-20 DIAGNOSIS — R202 Paresthesia of skin: Secondary | ICD-10-CM | POA: Diagnosis not present

## 2017-04-20 DIAGNOSIS — G2581 Restless legs syndrome: Secondary | ICD-10-CM | POA: Diagnosis not present

## 2017-04-20 DIAGNOSIS — G603 Idiopathic progressive neuropathy: Secondary | ICD-10-CM | POA: Diagnosis not present

## 2017-04-20 DIAGNOSIS — M542 Cervicalgia: Secondary | ICD-10-CM | POA: Diagnosis not present

## 2017-05-05 ENCOUNTER — Other Ambulatory Visit: Payer: Self-pay | Admitting: Family Medicine

## 2017-05-12 ENCOUNTER — Other Ambulatory Visit: Payer: Self-pay | Admitting: Family Medicine

## 2017-05-25 ENCOUNTER — Ambulatory Visit (INDEPENDENT_AMBULATORY_CARE_PROVIDER_SITE_OTHER): Payer: Medicare Other | Admitting: Family Medicine

## 2017-05-25 ENCOUNTER — Encounter: Payer: Self-pay | Admitting: Family Medicine

## 2017-05-25 VITALS — BP 117/66 | HR 61 | Ht 63.0 in | Wt 150.0 lb

## 2017-05-25 DIAGNOSIS — E782 Mixed hyperlipidemia: Secondary | ICD-10-CM

## 2017-05-25 DIAGNOSIS — N6320 Unspecified lump in the left breast, unspecified quadrant: Secondary | ICD-10-CM | POA: Diagnosis not present

## 2017-05-25 DIAGNOSIS — Z23 Encounter for immunization: Secondary | ICD-10-CM | POA: Diagnosis not present

## 2017-05-25 DIAGNOSIS — D508 Other iron deficiency anemias: Secondary | ICD-10-CM

## 2017-05-25 DIAGNOSIS — R7989 Other specified abnormal findings of blood chemistry: Secondary | ICD-10-CM | POA: Diagnosis not present

## 2017-05-25 DIAGNOSIS — I10 Essential (primary) hypertension: Secondary | ICD-10-CM

## 2017-05-25 DIAGNOSIS — R7303 Prediabetes: Secondary | ICD-10-CM

## 2017-05-25 MED ORDER — GEMFIBROZIL 600 MG PO TABS: ORAL_TABLET | ORAL | 3 refills | Status: DC

## 2017-05-25 NOTE — Progress Notes (Signed)
Cynthia Adams is a 72 y.o. female who presents to [facility] today for follow up HTN, anemia, fatigue.   HTN: Cynthia Adams takes metoprolol for hypertension.  She tends to tolerate this medication well denies chest pain palpitations or shortness of breath.  Anemia: Cynthia Adams was found to have a she has had normalizing hemoglobin persistently low ferritin 6 months ago.  Aside from fatigue she feels well.  Fatigue: Cynthia Adams notes fatigue.  She notes that she sleeps pretty well but feels tired by the end of the day.  She denies chest pain or shortness of breath with exertion.  She thinks probably her gabapentin is causing her symptoms.  He notes that when she does not take her gabapentin her pain worsened significantly. She notes that when she does not take gabapentin her pain worsened significantly.    Past Medical History:  Diagnosis Date  . Cataract 2017   Cataract surgery in 01-2016 and 02-2016  . GERD (gastroesophageal reflux disease)   . Hyperlipidemia   . Hypertension   . Nerve pain   . Seasonal allergies    Past Surgical History:  Procedure Laterality Date  . ABDOMINAL HYSTERECTOMY    . BREAST BIOPSY    . BREAST CYST EXCISION    . CATARACT EXTRACTION W/ INTRAOCULAR LENS  IMPLANT, BILATERAL Bilateral 01-2016, 02-2016  . CATARACT EXTRACTION W/ INTRAOCULAR LENS & ANTERIOR VITRECTOMY Bilateral 01-29-2016, 02-19-2016   Right eye in 01-2016 and Left eye 02-2016  . CESAREAN SECTION    . CESAREAN SECTION    . CHOLECYSTECTOMY    . LUMBAR DISC SURGERY    . TONSILLECTOMY     Social History   Tobacco Use  . Smoking status: Never Smoker  . Smokeless tobacco: Never Used  Substance Use Topics  . Alcohol use: Yes    Comment: may have a glass of wine but very, very seldom.   family history includes Alcohol abuse in her paternal uncle and paternal uncle; Breast cancer in her  maternal aunt, maternal aunt, maternal aunt, and mother; Cancer in her mother and paternal uncle; Dementia in her maternal aunt, maternal aunt, maternal aunt, mother, and sister; Heart disease in her paternal grandmother; Hyperlipidemia in her brother, mother, sister, sister, and sister; Hypertension in her brother, mother, sister, sister, sister, and son; Stroke in her maternal grandmother.  ROS as above:  Medications: Current Outpatient Medications  Medication Sig Dispense Refill  . aspirin 81 MG tablet Take 81 mg by mouth daily.    . Cyanocobalamin (B-12 PO) Take by mouth.    . estradiol (ESTRACE) 0.5 MG tablet TAKE ONE-HALF TABLET BY  MOUTH DAILY 45 tablet 0  . fluticasone (FLONASE) 50 MCG/ACT nasal spray Place 2 sprays into both nostrils daily. 48 g 3  . gabapentin (NEURONTIN) 600 MG tablet TAKE 1 TABLET BY MOUTH 3  TIMES DAILY 270 tablet 0  . gemfibrozil (LOPID) 600 MG tablet TAKE 1 TABLET BY MOUTH 2  TIMES DAILY BEFORE A MEAL. 180 tablet 3  . HYDROcodone-acetaminophen (NORCO) 10-325 MG tablet Take by mouth.    . loratadine (CLARITIN) 10 MG tablet Take 10 mg by mouth daily.    . metoprolol tartrate (LOPRESSOR) 100 MG tablet TAKE 1 TABLET BY MOUTH TWO  TIMES DAILY 180 tablet 1  . MULTIPLE VITAMIN PO Take by mouth.     No current facility-administered medications for this visit.    Allergies  Allergen Reactions  . Amoxicillin-Pot  Clavulanate   . Minocycline   . Pravastatin Diarrhea  . Sulfa Antibiotics   . Lipitor [Atorvastatin] Other (See Comments)    Diarrhea and insomnia    Health Maintenance Health Maintenance  Topic Date Due  . COLONOSCOPY  06/10/2017  . MAMMOGRAM  04/05/2019  . TETANUS/TDAP  08/15/2024  . INFLUENZA VACCINE  Completed  . DEXA SCAN  Completed  . Hepatitis C Screening  Completed  . PNA vac Low Risk Adult  Completed     Exam:  BP 117/66   Pulse 61   Ht 5\' 3"  (1.6 m)   Wt 150 lb (68 kg)   BMI 26.57 kg/m  Gen: Well NAD HEENT: EOMI,  MMM Lungs:  Normal work of breathing. CTABL Heart: RRR no MRG Abd: NABS, Soft. Nondistended, Nontender Exts: Brisk capillary refill, warm and well perfused.    No results found for this or any previous visit (from the past 72 hour(s)). No results found.    Assessment and Plan: 72 y.o. female with  Hypertension: Blood pressure at goal.  Continue current regimen recheck in 6 months.  Hyperlipidemia: Check fasting lipids continue current regimen patient intolerant to statins.  Anemia: History of iron deficiency anemia.  Check CBC ferritin TIBC and iron  Fatigue: Likely multifactorial.  Will complete metabolic workup listed below.  Most likely cause is medication side effect likely due to gabapentin.  The patient thinks the benefit of gabapentin is worth the side effects.  We will continue to follow.  We will also check hemoglobin A1c based on history of mildly elevated blood sugar.  We will give Pneumovax 23 vaccine prior to discharge  Orders Placed This Encounter  Procedures  . Pneumococcal polysaccharide vaccine 23-valent greater than or equal to 2yo subcutaneous/IM  . CBC  . COMPLETE METABOLIC PANEL WITH GFR  . Lipid Panel w/reflex Direct LDL  . Hemoglobin A1c  . Fe+TIBC+Fer   Meds ordered this encounter  Medications  . gemfibrozil (LOPID) 600 MG tablet    Sig: TAKE 1 TABLET BY MOUTH 2  TIMES DAILY BEFORE A MEAL.    Dispense:  180 tablet    Refill:  3     Discussed warning signs or symptoms. Please see discharge instructions. Patient expresses understanding.

## 2017-05-25 NOTE — Patient Instructions (Signed)
Thank you for coming in today. Get fasting labs in the near future.  Continue medicines.  We will check on the iron.  Gabapentin is likely causing fatigue.    Fatigue Fatigue is feeling tired all of the time, a lack of energy, or a lack of motivation. Occasional or mild fatigue is often a normal response to activity or life in general. However, long-lasting (chronic) or extreme fatigue may indicate an underlying medical condition. Follow these instructions at home: Watch your fatigue for any changes. The following actions may help to lessen any discomfort you are feeling:  Talk to your health care provider about how much sleep you need each night. Try to get the required amount every night.  Take medicines only as directed by your health care provider.  Eat a healthy and nutritious diet. Ask your health care provider if you need help changing your diet.  Drink enough fluid to keep your urine clear or pale yellow.  Practice ways of relaxing, such as yoga, meditation, massage therapy, or acupuncture.  Exercise regularly.  Change situations that cause you stress. Try to keep your work and personal routine reasonable.  Do not abuse illegal drugs.  Limit alcohol intake to no more than 1 drink per day for nonpregnant women and 2 drinks per day for men. One drink equals 12 ounces of beer, 5 ounces of wine, or 1 ounces of hard liquor.  Take a multivitamin, if directed by your health care provider.  Contact a health care provider if:  Your fatigue does not get better.  You have a fever.  You have unintentional weight loss or gain.  You have headaches.  You have difficulty: ? Falling asleep. ? Sleeping throughout the night.  You feel angry, guilty, anxious, or sad.  You are unable to have a bowel movement (constipation).  You skin is dry.  Your legs or another part of your body is swollen. Get help right away if:  You feel confused.  Your vision is blurry.  You feel  faint or pass out.  You have a severe headache.  You have severe abdominal, pelvic, or back pain.  You have chest pain, shortness of breath, or an irregular or fast heartbeat.  You are unable to urinate or you urinate less than normal.  You develop abnormal bleeding, such as bleeding from the rectum, vagina, nose, lungs, or nipples.  You vomit blood.  You have thoughts about harming yourself or committing suicide.  You are worried that you might harm someone else. This information is not intended to replace advice given to you by your health care provider. Make sure you discuss any questions you have with your health care provider. Document Released: 02/21/2007 Document Revised: 10/02/2015 Document Reviewed: 08/28/2013 Elsevier Interactive Patient Education  [employer].

## 2017-05-26 LAB — COMPLETE METABOLIC PANEL WITH GFR
AG Ratio: 1.8 (calc) (ref 1.0–2.5)
ALKALINE PHOSPHATASE (APISO): 86 U/L (ref 33–130)
ALT: 17 U/L (ref 6–29)
AST: 22 U/L (ref 10–35)
Albumin: 4.7 g/dL (ref 3.6–5.1)
BUN/Creatinine Ratio: 21 (calc) (ref 6–22)
BUN: 21 mg/dL (ref 7–25)
CO2: 28 mmol/L (ref 20–32)
CREATININE: 1 mg/dL — AB (ref 0.60–0.93)
Calcium: 9.9 mg/dL (ref 8.6–10.4)
Chloride: 102 mmol/L (ref 98–110)
GFR, Est African American: 66 mL/min/{1.73_m2} (ref >=60)
GFR, Est Non African American: 57 mL/min/{1.73_m2} — ABNORMAL LOW (ref >=60)
GLUCOSE: 95 mg/dL (ref 65–99)
Globulin: 2.6 g/dL (calc) (ref 1.9–3.7)
Potassium: 5.2 mmol/L (ref 3.5–5.3)
Sodium: 138 mmol/L (ref 135–146)
Total Bilirubin: 0.4 mg/dL (ref 0.2–1.2)
Total Protein: 7.3 g/dL (ref 6.1–8.1)

## 2017-05-26 LAB — CBC
HCT: 36.1 % (ref 35.0–45.0)
HEMOGLOBIN: 12.5 g/dL (ref 11.7–15.5)
MCH: 31.5 pg (ref 27.0–33.0)
MCHC: 34.6 g/dL (ref 32.0–36.0)
MCV: 90.9 fL (ref 80.0–100.0)
MPV: 12.1 fL (ref 7.5–12.5)
Platelets: 321 10*3/uL (ref 140–400)
RBC: 3.97 10*6/uL (ref 3.80–5.10)
RDW: 12.7 % (ref 11.0–15.0)
WBC: 9.3 10*3/uL (ref 3.8–10.8)

## 2017-05-26 LAB — LIPID PANEL W/REFLEX DIRECT LDL
CHOL/HDL RATIO: 5 (calc) — AB (ref <5.0)
CHOLESTEROL: 239 mg/dL — AB (ref <200)
HDL: 48 mg/dL — AB (ref >50)
LDL CHOLESTEROL (CALC): 161 mg/dL — AB
Non-HDL Cholesterol (Calc): 191 mg/dL (calc) — ABNORMAL HIGH (ref <130)
Triglycerides: 154 mg/dL — ABNORMAL HIGH (ref <150)

## 2017-05-26 LAB — HEMOGLOBIN A1C
HEMOGLOBIN A1C: 6 %{Hb} — AB (ref <5.7)
Mean Plasma Glucose: 126 (calc)
eAG (mmol/L): 7 (calc)

## 2017-05-26 LAB — IRON,TIBC AND FERRITIN PANEL
%SAT: 14 % (ref 11–50)
Ferritin: 60 ng/mL (ref 20–288)
Iron: 63 ug/dL (ref 45–160)
TIBC: 466 mcg/dL (calc) — ABNORMAL HIGH (ref 250–450)

## 2017-07-14 ENCOUNTER — Other Ambulatory Visit: Payer: Self-pay | Admitting: Family Medicine

## 2017-07-21 DIAGNOSIS — G603 Idiopathic progressive neuropathy: Secondary | ICD-10-CM | POA: Diagnosis not present

## 2017-07-21 DIAGNOSIS — M5417 Radiculopathy, lumbosacral region: Secondary | ICD-10-CM | POA: Diagnosis not present

## 2017-07-21 DIAGNOSIS — G5603 Carpal tunnel syndrome, bilateral upper limbs: Secondary | ICD-10-CM | POA: Diagnosis not present

## 2017-07-21 DIAGNOSIS — M5412 Radiculopathy, cervical region: Secondary | ICD-10-CM | POA: Diagnosis not present

## 2017-07-21 DIAGNOSIS — G2581 Restless legs syndrome: Secondary | ICD-10-CM | POA: Diagnosis not present

## 2017-09-20 ENCOUNTER — Encounter: Payer: Self-pay | Admitting: Family Medicine

## 2017-09-20 ENCOUNTER — Ambulatory Visit (INDEPENDENT_AMBULATORY_CARE_PROVIDER_SITE_OTHER): Payer: Medicare Other | Admitting: Family Medicine

## 2017-09-20 VITALS — BP 159/71 | HR 62 | Temp 97.8°F | Wt 155.0 lb

## 2017-09-20 DIAGNOSIS — J029 Acute pharyngitis, unspecified: Secondary | ICD-10-CM | POA: Diagnosis not present

## 2017-09-20 DIAGNOSIS — L723 Sebaceous cyst: Secondary | ICD-10-CM | POA: Insufficient documentation

## 2017-09-20 DIAGNOSIS — L219 Seborrheic dermatitis, unspecified: Secondary | ICD-10-CM

## 2017-09-20 LAB — POCT RAPID STREP A (OFFICE): RAPID STREP A SCREEN: NEGATIVE

## 2017-09-20 MED ORDER — KETOCONAZOLE 2 % EX CREA: 1.0000 "application " | TOPICAL_CREAM | Freq: Two times a day (BID) | CUTANEOUS | 3 refills | Status: DC

## 2017-09-20 NOTE — Patient Instructions (Addendum)
Thank you for coming in today. Lets try ketoconazole cream for rash.  If not better let me know.   Continue warm compress for syte.   Ok to use aleve for a limited time for sore throat.   Recheck as needed.   Please schedule for removal of sebaceous cyst.    Seborrheic Dermatitis, Adult Seborrheic dermatitis is a skin disease that causes red, scaly patches. It usually occurs on the scalp, and it is often called dandruff. The patches may appear on other parts of the body. Skin patches tend to appear where there are many oil glands in the skin. Areas of the body that are commonly affected include:  Scalp.  Skin folds of the body.  Ears.  Eyebrows.  Neck.  Face.  Armpits.  The bearded area of men's faces.  The condition may come and go for no known reason, and it is often long-lasting (chronic). What are the causes? The cause of this condition is not known. What increases the risk? This condition is more likely to develop in people who:  Have certain conditions, such as: ? HIV (human immunodeficiency virus). ? AIDS (acquired immunodeficiency syndrome). ? Parkinson disease. ? Mood disorders, such as depression.  Are 22-74 years old.  What are the signs or symptoms? Symptoms of this condition include:  Thick scales on the scalp.  Redness on the face or in the armpits.  Skin that is flaky. The flakes may be white or yellow.  Skin that seems oily or dry but is not helped with moisturizers.  Itching or burning in the affected areas.  How is this diagnosed? This condition is diagnosed with a medical history and physical exam. A sample of your skin may be tested (skin biopsy). You may need to see a skin specialist (dermatologist). How is this treated? There is no cure for this condition, but treatment can help to manage the symptoms. You may get treatment to remove scales, lower the risk of skin infection, and reduce swelling or itching. Treatment may  include:  Creams that reduce swelling and irritation (steroids).  Creams that reduce skin yeast.  Medicated shampoo, soaps, moisturizing creams, or ointments.  Medicated moisturizing creams or ointments.  Follow these instructions at home:  Apply over-the-counter and prescription medicines only as told by your health care provider.  Use any medicated shampoo, soaps, skin creams, or ointments only as told by your health care provider.  Keep all follow-up visits as told by your health care provider. This is important. Contact a health care provider if:  Your symptoms do not improve with treatment.  Your symptoms get worse.  You have new symptoms. This information is not intended to replace advice given to you by your health care provider. Make sure you discuss any questions you have with your health care provider. Document Released: 04/26/2005 Document Revised: 11/14/2015 Document Reviewed: 08/14/2015 Elsevier Interactive Patient Education  2018 [place] is a bump on your eyelid caused by a bacterial infection. A stye can form inside the eyelid (internal stye) or outside the eyelid (external stye). An internal stye may be caused by an infected oil-producing gland inside your eyelid. An external stye may be caused by an infection at the base of your eyelash (hair follicle). Styes are very common. Anyone can get them at any age. They usually occur in just one eye, but you may have more than one in either eye. What are the causes? The infection is almost always  caused by bacteria called Staphylococcus aureus. This is a common type of bacteria that lives on your skin. What increases the risk? You may be at higher risk for a stye if you have had one before. You may also be at higher risk if you have:  Diabetes.  Long-term illness.  Long-term eye redness.  A skin condition called seborrhea.  High fat levels in your blood (lipids).  What are the signs or  symptoms? Eyelid pain is the most common symptom of a stye. Internal styes are more painful than external styes. Other signs and symptoms may include:  Painful swelling of your eyelid.  A scratchy feeling in your eye.  Tearing and redness of your eye.  Pus draining from the stye.  How is this diagnosed? Your health care provider may be able to diagnose a stye just by examining your eye. The health care provider may also check to make sure:  You do not have a fever or other signs of a more serious infection.  The infection has not spread to other parts of your eye or areas around your eye.  How is this treated? Most styes will clear up in a few days without treatment. In some cases, you may need to use antibiotic drops or ointment to prevent infection. Your health care provider may have to drain the stye surgically if your stye is:  Large.  Causing a lot of pain.  Interfering with your vision.  This can be done using a thin blade or a needle. Follow these instructions at home:  Take medicines only as directed by your health care provider.  Apply a clean, warm compress to your eye for 10 minutes, 4 times a day.  Do not wear contact lenses or eye makeup until your stye has healed.  Do not try to pop or drain the stye. Contact a health care provider if:  You have chills or a fever.  Your stye does not go away after several days.  Your stye affects your vision.  Your eyeball becomes swollen, red, or painful. This information is not intended to replace advice given to you by your health care provider. Make sure you discuss any questions you have with your health care provider. Document Released: 02/03/2005 Document Revised: 12/21/2015 Document Reviewed: 08/10/2013 Elsevier Interactive Patient Education  [employer].

## 2017-09-20 NOTE — Progress Notes (Signed)
Cynthia Adams is a 72 y.o. female who presents to [facility] today for rash and sore throat.  Rash: Rash has been ongoing for several months. It is located on and around her nose and it comes and goes. She does not know what triggers the rash and has not switched soaps, make ups, or any other products that come in contact with her face. She denies stinging or itching. It is not painful. No fever or chills.   Sore throat: Patient developed a sore throat Sunday morning. She states she has mild nasal drainage down her throat but has not begun experiencing allergy symptoms that she usually has in late May each year. She denies any fever, but states she takes Hydrocodone regularly which may suppress her fever. Positive sick contact with strep throat.   Subcutaneous cyst: On posterior neck. Non-tender. Occasionally expresses foul smelling material. Jordie was told by another doctor that it is a sebaceous cyst and should be removed.    Past Medical History:  Diagnosis Date  . Cataract 2017   Cataract surgery in 01-2016 and 02-2016  . GERD (gastroesophageal reflux disease)   . Hyperlipidemia   . Hypertension   . Nerve pain   . Seasonal allergies    Past Surgical History:  Procedure Laterality Date  . ABDOMINAL HYSTERECTOMY    . BREAST BIOPSY    . BREAST CYST EXCISION    . CATARACT EXTRACTION W/ INTRAOCULAR LENS  IMPLANT, BILATERAL Bilateral 01-2016, 02-2016  . CATARACT EXTRACTION W/ INTRAOCULAR LENS & ANTERIOR VITRECTOMY Bilateral 01-29-2016, 02-19-2016   Right eye in 01-2016 and Left eye 02-2016  . CESAREAN SECTION    . CESAREAN SECTION    . CHOLECYSTECTOMY    . LUMBAR DISC SURGERY    . TONSILLECTOMY     Social History   Tobacco Use  . Smoking status: Never Smoker  . Smokeless tobacco: Never Used  Substance Use Topics  . Alcohol use: Yes    Comment: may have a  glass of wine but very, very seldom.   family history includes Alcohol abuse in her paternal uncle and paternal uncle; Breast cancer in her maternal aunt, maternal aunt, maternal aunt, and mother; Cancer in her mother and paternal uncle; Dementia in her maternal aunt, maternal aunt, maternal aunt, mother, and sister; Heart disease in her paternal grandmother; Hyperlipidemia in her brother, mother, sister, sister, and sister; Hypertension in her brother, mother, sister, sister, sister, and son; Stroke in her maternal grandmother.  ROS as above:  Medications: Current Outpatient Medications  Medication Sig Dispense Refill  . aspirin 81 MG tablet Take 81 mg by mouth daily.    . Cyanocobalamin (B-12 PO) Take by mouth.    . estradiol (ESTRACE) 0.5 MG tablet TAKE ONE-HALF TABLET BY  MOUTH DAILY 45 tablet 0  . fluticasone (FLONASE) 50 MCG/ACT nasal spray Place 2 sprays into both nostrils daily. 48 g 3  . gabapentin (NEURONTIN) 600 MG tablet TAKE 1 TABLET BY MOUTH 3  TIMES DAILY 270 tablet 0  . gemfibrozil (LOPID) 600 MG tablet TAKE 1 TABLET BY MOUTH 2  TIMES DAILY BEFORE A MEAL. 180 tablet 3  . HYDROcodone-acetaminophen (NORCO) 10-325 MG tablet Take 1 tablet by mouth 3 (three) times daily.    Marland Kitchen loratadine (CLARITIN) 10 MG tablet Take 10 mg by mouth daily.    . metoprolol tartrate (LOPRESSOR) 100 MG tablet TAKE 1 TABLET BY MOUTH TWO  TIMES DAILY  180 tablet 1  . MULTIPLE VITAMIN PO Take by mouth.    . estrogens, conjugated, (PREMARIN) 0.3 MG tablet Take 0.5 tablets by mouth.    Marland Kitchen ketoconazole (NIZORAL) 2 % cream Apply 1 application topically 2 (two) times daily. To affected areas. 60 g 3  . Multiple Vitamins-Minerals (MULTIVITAMIN WITH MINERALS) tablet Take 1 tablet by mouth daily.     No current facility-administered medications for this visit.    Allergies  Allergen Reactions  . Sulfa Antibiotics Swelling  . Amoxicillin-Pot Clavulanate Diarrhea  . Lipitor [Atorvastatin] Other (See Comments)     Diarrhea and insomnia  . Minocycline Diarrhea  . Pravastatin Diarrhea    Health Maintenance Health Maintenance  Topic Date Due  . COLONOSCOPY  06/10/2017  . INFLUENZA VACCINE  12/08/2017  . MAMMOGRAM  04/05/2019  . TETANUS/TDAP  08/15/2024  . DEXA SCAN  Completed  . Hepatitis C Screening  Completed  . PNA vac Low Risk Adult  Completed     Exam:  BP (!) 159/71   Pulse 62   Temp 97.8 F (36.6 C) (Oral)   Wt 155 lb (70.3 kg)   BMI 27.46 kg/m  Gen: Well NAD HEENT: EOMI,  MMM, small 5 mm papule on right upper eyelid (Stye), erythematous throat  Lungs: Normal work of breathing. CTABL Heart: RRR no MRG Abd: NABS, Soft. Nondistended, Nontender Exts: Brisk capillary refill, warm and well perfused.  Skin Scattered lacy macular erytheto the left cherematus rash with occasional papules across the bridge of the nose extending the left malar area.  Small nodule posterior neck midline. Non-tender.   Results for orders placed or performed in visit on 09/20/17 (from the past 72 hour(s))  POCT rapid strep A     Status: None   Collection Time: 09/20/17  3:46 PM  Result Value Ref Range   Rapid Strep A Screen Negative Negative   No results found.    Assessment and Plan: 72 y.o. female with   Sore throat: Her throat is erythematous but was negative for strep. This is likely viral or allergic in etiology. She should continue to treat it with OTC medications. She is likely to get better soon, but if she does not or gets worse we will consider prescribing antibiotics.   Facial rash: Patient's facial rash is in a location that makes me most suspicious for either seborrheic dermatitis or rosacea. Due to lack of symptoms of stinging, itch, or scaling I believe seborrheic dermatitis is more likely. We will treat with ketoconazole cream. She should get better soon, but if she does not she should contact us and we will treat for Rosacea.   Sebaceous Cyst: Patient noted a cyst on the back of her  neck. She will follow up for removal.    Orders Placed This Encounter  Procedures  . POCT rapid strep A   Meds ordered this encounter  Medications  . ketoconazole (NIZORAL) 2 % cream    Sig: Apply 1 application topically 2 (two) times daily. To affected areas.    Dispense:  60 g    Refill:  3     Discussed warning signs or symptoms. Please see discharge instructions. Patient expresses understanding.

## 2017-09-27 ENCOUNTER — Other Ambulatory Visit: Payer: Self-pay | Admitting: Family Medicine

## 2017-09-27 ENCOUNTER — Ambulatory Visit (INDEPENDENT_AMBULATORY_CARE_PROVIDER_SITE_OTHER): Payer: Medicare Other | Admitting: Family Medicine

## 2017-09-27 ENCOUNTER — Encounter: Payer: Self-pay | Admitting: Family Medicine

## 2017-09-27 VITALS — BP 130/58 | HR 60 | Ht 60.0 in | Wt 155.0 lb

## 2017-09-27 DIAGNOSIS — L723 Sebaceous cyst: Secondary | ICD-10-CM | POA: Diagnosis not present

## 2017-09-27 NOTE — Patient Instructions (Signed)
Thank you for coming in today. Return in about 1 week for suture removal.  Keep the wound covered with ointment.  Recheck sooner if needed.    Sutured Wound Care Sutures are stitches that can be used to close wounds. Taking care of your wound properly can help to prevent pain and infection. It can also help your wound to heal more quickly. How is this treated? Wound Care  Keep the wound clean and dry.  If you were given a bandage (dressing), you should change it at least once per day or as directed by your health care provider. You should also change it if it becomes wet or dirty.  Keep the wound completely dry for the first 24 hours or as directed by your health care provider. After that time, you may shower or bathe. However, make sure that the wound is not soaked in water until the sutures have been removed.  Clean the wound one time each day or as directed by your health care provider. ? Wash the wound with soap and water. ? Rinse the wound with water to remove all soap. ? Pat the wound dry with a clean towel. Do not rub the wound.  Aftercleaning the wound, apply a thin layer of antibioticointment as directed by your health care provider. This will help to prevent infection and keep the dressing from sticking to the wound.  Have the sutures removed as directed by your health care provider. General Instructions  Take or apply medicines only as directed by your health care provider.  To help prevent scarring, make sure to cover your wound with sunscreen whenever you are outside after the sutures are removed and the wound is healed. Make sure to wear a sunscreen of at least 30 SPF.  If you were prescribed an antibiotic medicine or ointment, finish all of it even if you start to feel better.  Do not scratch or pick at the wound.  Keep all follow-up visits as directed by your health care provider. This is important.  Check your wound every day for signs of infection. Watch  for: ? Redness, swelling, or pain. ? Fluid, blood, or pus.  Raise (elevate) the injured area above the level of your heart while you are sitting or lying down, if possible.  Avoid stretching your wound.  Drink enough fluids to keep your urine clear or pale yellow. Contact a health care provider if:  You received a tetanus shot and you have swelling, severe pain, redness, or bleeding at the injection site.  You have a fever.  A wound that was closed breaks open.  You notice a bad smell coming from the wound.  You notice something coming out of the wound, such as wood or glass.  Your pain is not controlled with medicine.  You have increased redness, swelling, or pain at the site of your wound.  You have fluid, blood, or pus coming from your wound.  You notice a change in the color of your skin near your wound.  You need to change the dressing frequently due to fluid, blood, or pus draining from the wound.  You develop a new rash.  You develop numbness around the wound. Get help right away if:  You develop severe swelling around the injury site.  Your pain suddenly increases and is severe.  You develop painful lumps near the wound or on skin that is anywhere on your body.  You have a red streak going away from your wound.  The wound is on your hand or foot and you cannot properly move a finger or toe.  The wound is on your hand or foot and you notice that your fingers or toes look pale or bluish. This information is not intended to replace advice given to you by your health care provider. Make sure you discuss any questions you have with your health care provider. Document Released: 06/03/2004 Document Revised: 10/02/2015 Document Reviewed: 12/06/2012 Elsevier Interactive Patient Education  2017 [employer].

## 2017-09-27 NOTE — Progress Notes (Signed)
Cynthia Adams returns to clinic for previously arranged removal of sebaceous cyst   Consent obtained and timeout performed.  Skin cleaned with rubbing alcohol. 36ml of 1% lidocaine with epi was injected achieving good anaesthesia.   Skin sterilized using chlorhexidine and draped in the usual sterile fashion.  Incision was made along skin tension line overlying the subcutaneous nodule location.  Total length 1 cm. The skin was undermined using blunt dissection. No obvious cyst seen on inspection. Skin palpated and the incision was extended perpendicular inferior to the original line. The soft tissue was dissected and a small 9mm nodule removed.  Two simple interrupted sutures were used to close the wound.  Antibiotic ointment and a dressing was applied.  Patient tolerated the procedure well.    I discussed with [name] that we likely removed the sebaceous cyst nodule however it is possible that what I removed was not the nodule she was discussing.  Plan on recheck in 1 week for suture removal and re-inspection of the wound.

## 2017-10-04 ENCOUNTER — Ambulatory Visit (INDEPENDENT_AMBULATORY_CARE_PROVIDER_SITE_OTHER): Payer: Medicare Other | Admitting: Family Medicine

## 2017-10-04 ENCOUNTER — Encounter: Payer: Self-pay | Admitting: Family Medicine

## 2017-10-04 VITALS — BP 151/74 | Wt 157.0 lb

## 2017-10-04 DIAGNOSIS — Z1211 Encounter for screening for malignant neoplasm of colon: Secondary | ICD-10-CM

## 2017-10-04 DIAGNOSIS — L723 Sebaceous cyst: Secondary | ICD-10-CM | POA: Diagnosis not present

## 2017-10-04 NOTE — Progress Notes (Signed)
Cynthia Adams is a 72 y.o. female who presents to [facility] today for suture removal and follow up sebaceous cyst.   Cynthia Adams was seen last week for removal of sebaceous cyst. During the the removal I was not 100% sure that I was able to remove the cyst entirely.  She returns today feeling quite well.  She denies any pain or significant discharge from the site.  No fevers or chills.  Additionally patient notes that she is due for colonoscopy.     ROS as above:  Exam:  BP (!) 151/74   Wt 157 lb (71.2 kg)   BMI 30.66 kg/m  Gen: Well NAD Skin: Well appearing incision.  No significant drainage.  No palpable sebaceous cyst.    Assessment and Plan: 72 y.o. female with  2 sutures removed.  Sebaceous cyst is not palpable I think it very likely that it was removed successfully during the procedure.  Ultimately if it recurs we will consider repeat excision versus referral to dermatology.    Referral to gastroenterology for colonoscopy arranged after discussion.  I spent 15 minutes with this patient, greater than 50% was face-to-face time counseling regarding ddx and treatment plan.  Orders Placed This Encounter  Procedures  . Ambulatory referral to Gastroenterology    Referral Priority:   Routine    Referral Type:   Consultation    Referral Reason:   Specialty Services Required    Number of Visits Requested:   1   No orders of the defined types were placed in this encounter.    Historical information moved to improve visibility of documentation.  Past Medical History:  Diagnosis Date  . Cataract 2017   Cataract surgery in 01-2016 and 02-2016  . GERD (gastroesophageal reflux disease)   . Hyperlipidemia   . Hypertension   . Nerve pain   . Seasonal allergies    Past Surgical History:  Procedure Laterality Date  . ABDOMINAL HYSTERECTOMY    . BREAST BIOPSY     . BREAST CYST EXCISION    . CATARACT EXTRACTION W/ INTRAOCULAR LENS  IMPLANT, BILATERAL Bilateral 01-2016, 02-2016  . CATARACT EXTRACTION W/ INTRAOCULAR LENS & ANTERIOR VITRECTOMY Bilateral 01-29-2016, 02-19-2016   Right eye in 01-2016 and Left eye 02-2016  . CESAREAN SECTION    . CESAREAN SECTION    . CHOLECYSTECTOMY    . LUMBAR DISC SURGERY    . TONSILLECTOMY     Social History   Tobacco Use  . Smoking status: Never Smoker  . Smokeless tobacco: Never Used  Substance Use Topics  . Alcohol use: Yes    Comment: may have a glass of wine but very, very seldom.   family history includes Alcohol abuse in her paternal uncle and paternal uncle; Breast cancer in her maternal aunt, maternal aunt, maternal aunt, and mother; Cancer in her mother and paternal uncle; Dementia in her maternal aunt, maternal aunt, maternal aunt, mother, and sister; Heart disease in her paternal grandmother; Hyperlipidemia in her brother, mother, sister, sister, and sister; Hypertension in her brother, mother, sister, sister, sister, and son; Stroke in her maternal grandmother.  Medications: Current Outpatient Medications  Medication Sig Dispense Refill  . aspirin 81 MG tablet Take 81 mg by mouth daily.    . Cyanocobalamin (B-12 PO) Take by mouth.    . estradiol (ESTRACE) 0.5 MG tablet TAKE ONE-HALF TABLET BY  MOUTH DAILY 45 tablet 0  . fluticasone (FLONASE)  50 MCG/ACT nasal spray Place 2 sprays into both nostrils daily. 48 g 3  . gabapentin (NEURONTIN) 600 MG tablet TAKE 1 TABLET BY MOUTH 3  TIMES DAILY 270 tablet 0  . gemfibrozil (LOPID) 600 MG tablet TAKE 1 TABLET BY MOUTH 2  TIMES DAILY BEFORE A MEAL. 180 tablet 3  . HYDROcodone-acetaminophen (NORCO) 10-325 MG tablet Take 1 tablet by mouth 3 (three) times daily.    Marland Kitchen ketoconazole (NIZORAL) 2 % cream Apply 1 application topically 2 (two) times daily. To affected areas. 60 g 3  . loratadine (CLARITIN) 10 MG tablet Take 10 mg by mouth daily.    . metoprolol  tartrate (LOPRESSOR) 100 MG tablet TAKE 1 TABLET BY MOUTH TWO  TIMES DAILY 180 tablet 1  . MULTIPLE VITAMIN PO Take by mouth.    . Multiple Vitamins-Minerals (MULTIVITAMIN WITH MINERALS) tablet Take 1 tablet by mouth daily.     No current facility-administered medications for this visit.    Allergies  Allergen Reactions  . Sulfa Antibiotics Swelling  . Amoxicillin-Pot Clavulanate Diarrhea  . Lipitor [Atorvastatin] Other (See Comments)    Diarrhea and insomnia  . Minocycline Diarrhea  . Pravastatin Diarrhea    Health Maintenance Health Maintenance  Topic Date Due  . COLONOSCOPY  06/10/2017  . INFLUENZA VACCINE  12/08/2017  . MAMMOGRAM  04/05/2019  . TETANUS/TDAP  08/15/2024  . DEXA SCAN  Completed  . Hepatitis C Screening  Completed  . PNA vac Low Risk Adult  Completed    Discussed warning signs or symptoms. Please see discharge instructions. Patient expresses understanding.

## 2017-10-04 NOTE — Patient Instructions (Signed)
Thank you for coming in today. Recheck with me as needed or as previously scheduled.

## 2017-10-19 ENCOUNTER — Other Ambulatory Visit: Payer: Self-pay | Admitting: Family Medicine

## 2017-10-27 DIAGNOSIS — M5417 Radiculopathy, lumbosacral region: Secondary | ICD-10-CM | POA: Diagnosis not present

## 2017-10-27 DIAGNOSIS — M5412 Radiculopathy, cervical region: Secondary | ICD-10-CM | POA: Diagnosis not present

## 2017-10-27 DIAGNOSIS — G2581 Restless legs syndrome: Secondary | ICD-10-CM | POA: Diagnosis not present

## 2017-10-27 DIAGNOSIS — R202 Paresthesia of skin: Secondary | ICD-10-CM | POA: Diagnosis not present

## 2017-10-27 DIAGNOSIS — Z79899 Other long term (current) drug therapy: Secondary | ICD-10-CM | POA: Diagnosis not present

## 2017-11-30 ENCOUNTER — Other Ambulatory Visit: Payer: Self-pay | Admitting: Family Medicine

## 2017-12-01 ENCOUNTER — Telehealth: Payer: Self-pay | Admitting: Family Medicine

## 2017-12-01 NOTE — Telephone Encounter (Signed)
Called patient and notified meds refilled and to call office and schedule an appointment for BP followup when she knows her schedule. Patient agrees with plan. KG LPN

## 2017-12-01 NOTE — Telephone Encounter (Signed)
Gabapentin and metoprolol refilled.  Patient should be due soon in the near future for blood pressure follow-up.  Please schedule appointment.

## 2017-12-07 ENCOUNTER — Other Ambulatory Visit: Payer: Self-pay | Admitting: Family Medicine

## 2018-01-03 DIAGNOSIS — R198 Other specified symptoms and signs involving the digestive system and abdomen: Secondary | ICD-10-CM | POA: Diagnosis not present

## 2018-01-03 DIAGNOSIS — K219 Gastro-esophageal reflux disease without esophagitis: Secondary | ICD-10-CM | POA: Diagnosis not present

## 2018-01-19 DIAGNOSIS — G603 Idiopathic progressive neuropathy: Secondary | ICD-10-CM | POA: Diagnosis not present

## 2018-01-19 DIAGNOSIS — M542 Cervicalgia: Secondary | ICD-10-CM | POA: Diagnosis not present

## 2018-01-19 DIAGNOSIS — R202 Paresthesia of skin: Secondary | ICD-10-CM | POA: Diagnosis not present

## 2018-01-19 DIAGNOSIS — G5603 Carpal tunnel syndrome, bilateral upper limbs: Secondary | ICD-10-CM | POA: Diagnosis not present

## 2018-01-19 DIAGNOSIS — M5417 Radiculopathy, lumbosacral region: Secondary | ICD-10-CM | POA: Diagnosis not present

## 2018-02-02 ENCOUNTER — Other Ambulatory Visit: Payer: Self-pay | Admitting: Family Medicine

## 2018-02-16 ENCOUNTER — Other Ambulatory Visit: Payer: Self-pay | Admitting: Family Medicine

## 2018-02-16 ENCOUNTER — Telehealth: Payer: Self-pay | Admitting: Family Medicine

## 2018-02-16 DIAGNOSIS — Z780 Asymptomatic menopausal state: Secondary | ICD-10-CM

## 2018-02-16 DIAGNOSIS — Z1239 Encounter for other screening for malignant neoplasm of breast: Secondary | ICD-10-CM

## 2018-02-16 DIAGNOSIS — M8589 Other specified disorders of bone density and structure, multiple sites: Secondary | ICD-10-CM

## 2018-02-16 NOTE — Telephone Encounter (Signed)
-   DEXA scan ordered

## 2018-02-16 NOTE — Telephone Encounter (Signed)
Patient has been advised. Cynthia Adams,CMA  

## 2018-02-16 NOTE — Telephone Encounter (Signed)
Imaging will contact patient..Thanks!

## 2018-02-16 NOTE — Telephone Encounter (Signed)
Pt called. She would like to have an order sent down for a bone density test. She has a mammo scheduled on 10/24 and wants them both done on the same day.  Thanks!

## 2018-02-22 DIAGNOSIS — K219 Gastro-esophageal reflux disease without esophagitis: Secondary | ICD-10-CM | POA: Diagnosis not present

## 2018-02-22 DIAGNOSIS — R198 Other specified symptoms and signs involving the digestive system and abdomen: Secondary | ICD-10-CM | POA: Diagnosis not present

## 2018-04-12 ENCOUNTER — Ambulatory Visit (INDEPENDENT_AMBULATORY_CARE_PROVIDER_SITE_OTHER): Payer: Medicare Other

## 2018-04-12 ENCOUNTER — Telehealth: Payer: Self-pay | Admitting: Family Medicine

## 2018-04-12 DIAGNOSIS — Z13820 Encounter for screening for osteoporosis: Secondary | ICD-10-CM | POA: Diagnosis not present

## 2018-04-12 DIAGNOSIS — Z1231 Encounter for screening mammogram for malignant neoplasm of breast: Secondary | ICD-10-CM | POA: Diagnosis not present

## 2018-04-12 DIAGNOSIS — Z780 Asymptomatic menopausal state: Secondary | ICD-10-CM

## 2018-04-12 DIAGNOSIS — Z1239 Encounter for other screening for malignant neoplasm of breast: Secondary | ICD-10-CM

## 2018-04-12 DIAGNOSIS — M8589 Other specified disorders of bone density and structure, multiple sites: Secondary | ICD-10-CM

## 2018-04-12 NOTE — Telephone Encounter (Signed)
Patient has been advised she stated that she will call back in January to schedule. Josean Lycan,CMA

## 2018-04-12 NOTE — Telephone Encounter (Signed)
After reviewing bone density scan I noticed that you are going to be due for a blood pressure recheck and possibly a physical in the near future. Please schedule a follow-up appointment with me in the near future. Additionally while scheduling please arrange for a nurse visit Medicare wellness visit.  This would be best done prior to visit with me if possible.  However this is not a priority.

## 2018-04-13 ENCOUNTER — Other Ambulatory Visit: Payer: Self-pay

## 2018-04-17 ENCOUNTER — Telehealth: Payer: Self-pay | Admitting: Family Medicine

## 2018-04-17 ENCOUNTER — Other Ambulatory Visit: Payer: Self-pay

## 2018-04-17 DIAGNOSIS — K5790 Diverticulosis of intestine, part unspecified, without perforation or abscess without bleeding: Secondary | ICD-10-CM

## 2018-04-17 MED ORDER — ESTRADIOL 0.5 MG PO TABS: 0.2500 mg | ORAL_TABLET | Freq: Every day | ORAL | 0 refills | Status: DC

## 2018-04-17 MED ORDER — GABAPENTIN 600 MG PO TABS: 600.0000 mg | ORAL_TABLET | Freq: Three times a day (TID) | ORAL | 0 refills | Status: DC

## 2018-04-17 NOTE — Telephone Encounter (Signed)
Received documentation from gastroenterology.  Patient had colonoscopy with procedure date Oct 03, 2014.  Colonoscopy showed diverticulosis. Recheck 10 years.  Note will be sent to abstract.

## 2018-04-21 ENCOUNTER — Encounter: Payer: Self-pay | Admitting: Osteopathic Medicine

## 2018-04-21 ENCOUNTER — Ambulatory Visit (INDEPENDENT_AMBULATORY_CARE_PROVIDER_SITE_OTHER): Payer: Medicare Other | Admitting: Osteopathic Medicine

## 2018-04-21 VITALS — BP 143/70 | HR 50 | Temp 97.8°F | Wt 154.6 lb

## 2018-04-21 DIAGNOSIS — R238 Other skin changes: Secondary | ICD-10-CM

## 2018-04-21 MED ORDER — FLUOCINOLONE ACETONIDE 0.01 % EX CREA: TOPICAL_CREAM | Freq: Two times a day (BID) | CUTANEOUS | 0 refills | Status: DC

## 2018-04-21 NOTE — Progress Notes (Signed)
HPI: Cynthia Adams is a 72 y.o. female who  has a past medical history of Cataract (2017), GERD (gastroesophageal reflux disease), Hyperlipidemia, Hypertension, Nerve pain, and Seasonal allergies.  she presents to [facility] today, 04/21/18,  for chief complaint of:  Rash on face   Pink/red splotches this morning after washing her face. Worried about shingles. Itched a bit but no blistering or ulceration. No recent changes to skin care products, she does not exfoliate.   Other skin issues: Hx seborrheic dermatitis she has a Rx for ketoconazole topical from Dr Georgina Snell but hasn't been using this.   BP elevated on intake, recheck was 143/70. No Cp/SOB. Pt had rushed to this appointment after waking up and noting the rash.     At today's visit... Past medical history, surgical history, and family history reviewed and updated as needed.  Current medication list and allergy/intolerance information reviewed and updated as needed. (See remainder of HPI, ROS, Phys Exam below)                  ASSESSMENT/PLAN: The encounter diagnosis was Skin irritation.   No shingles  Question contact dermatitis type reaction, rosacea, pt reports increased stress can break out her skin on occasion.    Meds ordered this encounter  Medications  . fluocinolone (VANOS) 0.01 % cream    Sig: Apply topically 2 (two) times daily. To affected area(s) as needed for mild facial rash    Dispense:  15 g    Refill:  0    Patient Instructions  I sent a low-potency steroid cream to the pharmacy, can use this twice a day for 3-4 days and hopefully this will help calm the skin down. If not, please call me and we can arrange a different medicine or a dermatology referral.        Follow-up plan: Return if symptoms worsen or fail to  improve.                             ############################################ ############################################ ############################################ ############################################    Current Meds  Medication Sig  . aspirin 81 MG tablet Take 81 mg by mouth daily.  . Cyanocobalamin (B-12 PO) Take by mouth.  . estradiol (ESTRACE) 0.5 MG tablet Take 0.5 tablets (0.25 mg total) by mouth daily.  . fluticasone (FLONASE) 50 MCG/ACT nasal spray USE 2 SPRAYS IN EACH  NOSTRIL DAILY  . gabapentin (NEURONTIN) 600 MG tablet Take 1 tablet (600 mg total) by mouth 3 (three) times daily.  Marland Kitchen gemfibrozil (LOPID) 600 MG tablet TAKE 1 TABLET BY MOUTH 2  TIMES DAILY BEFORE A MEAL.  Marland Kitchen HYDROcodone-acetaminophen (NORCO) 10-325 MG tablet Take 1 tablet by mouth 3 (three) times daily.  Marland Kitchen ketoconazole (NIZORAL) 2 % cream Apply 1 application topically 2 (two) times daily. To affected areas.  . loratadine (CLARITIN) 10 MG tablet Take 10 mg by mouth daily.  . metoprolol tartrate (LOPRESSOR) 100 MG tablet TAKE 1 TABLET BY MOUTH TWO  TIMES DAILY  . MULTIPLE VITAMIN PO Take by mouth.    Allergies  Allergen Reactions  . Minocycline Diarrhea, Nausea And Vomiting and Other (See Comments)    Stomach pain    . Sulfa Antibiotics Swelling  . Amoxicillin-Pot Clavulanate Diarrhea  . Lipitor [Atorvastatin] Other (See Comments)    Diarrhea and insomnia  . Pravastatin Diarrhea       Review of Systems:  Constitutional: No recent illness  HEENT: No  headache, no vision change  Cardiac: No  chest pain, No  pressure, No palpitations  Respiratory:  No  shortness of breath. No  Cough  Gastrointestinal: No  abdominal pan, no change on bowel habitsi  Musculoskeletal: No new myalgia/arthralgia  Skin: +Rash  Hem/Onc: No  easy bruising/bleeding, No  abnormal lumps/bumps  Neurologic: No  weakness, No  Dizziness  Psychiatric: No  concerns with depression,  +concerns with anxiety  Exam:  BP (!) 143/70 (BP Location: Left Arm, Patient Position: Sitting, Cuff Size: Normal)   Pulse (!) 50   Temp 97.8 F (36.6 C) (Oral)   Wt 154 lb 9.6 oz (70.1 kg)   BMI 30.19 kg/m   Constitutional: VS see above. General Appearance: alert, well-developed, well-nourished, NAD  Eyes: Normal lids and conjunctive, non-icteric sclera  Ears, Nose, Mouth, Throat: MMM, Normal external inspection ears/nares/mouth/lips/gums.  Neck: No masses, trachea midline.   Respiratory: Normal respiratory effort.murmur, no rub/gallop auscultated. RRR.   Musculoskeletal: Gait normal. Symmetric and independent movement of all extremities  Neurological: Normal balance/coordination. No tremor.  Skin: warm, dry, intact. See photos above - mild macular rash no ulceration or drainage, no raised nodules or pustules, no blistering  Psychiatric: Normal judgment/insight. Normal mood and affect. Oriented x3.       Visit summary with medication list and pertinent instructions was printed for patient to review, patient was advised to alert [place] if any updates are needed. All questions at time of visit were answered - patient instructed to contact office with any additional concerns. ER/RTC precautions were reviewed with the patient and understanding verbalized.      Please note: voice recognition software was used to produce this document, and typos may escape review. Please contact Dr. Sheppard Coil for any needed clarifications.    Follow up plan: Return if symptoms worsen or fail to improve.

## 2018-04-21 NOTE — Patient Instructions (Signed)
I sent a low-potency steroid cream to the pharmacy, can use this twice a day for 3-4 days and hopefully this will help calm the skin down. If not, please call me and we can arrange a different medicine or a dermatology referral.

## 2018-04-27 ENCOUNTER — Encounter: Payer: Self-pay | Admitting: Family Medicine

## 2018-04-27 DIAGNOSIS — M542 Cervicalgia: Secondary | ICD-10-CM | POA: Diagnosis not present

## 2018-04-27 DIAGNOSIS — G603 Idiopathic progressive neuropathy: Secondary | ICD-10-CM | POA: Diagnosis not present

## 2018-04-27 DIAGNOSIS — G5603 Carpal tunnel syndrome, bilateral upper limbs: Secondary | ICD-10-CM | POA: Diagnosis not present

## 2018-04-27 DIAGNOSIS — G2581 Restless legs syndrome: Secondary | ICD-10-CM | POA: Diagnosis not present

## 2018-04-27 DIAGNOSIS — M5417 Radiculopathy, lumbosacral region: Secondary | ICD-10-CM | POA: Diagnosis not present

## 2018-05-18 ENCOUNTER — Telehealth: Payer: Self-pay | Admitting: Family Medicine

## 2018-05-18 DIAGNOSIS — R7303 Prediabetes: Secondary | ICD-10-CM

## 2018-05-18 DIAGNOSIS — D508 Other iron deficiency anemias: Secondary | ICD-10-CM

## 2018-05-18 DIAGNOSIS — I10 Essential (primary) hypertension: Secondary | ICD-10-CM

## 2018-05-18 DIAGNOSIS — E782 Mixed hyperlipidemia: Secondary | ICD-10-CM

## 2018-05-18 NOTE — Telephone Encounter (Signed)
Patient would like labs ordered prior to physical on 05/29/2018. States that she will come in one day next week to complete them. Please contact and advise once ordered.

## 2018-05-18 NOTE — Telephone Encounter (Signed)
Labs pended and routing to PCP.

## 2018-05-19 NOTE — Telephone Encounter (Signed)
Called patient to advise that labs were ordered but could not speak to the patient and was unable to leave a vm. Rhonda Cunningham,CMA

## 2018-05-19 NOTE — Telephone Encounter (Signed)
Labs ordered.  This should be done fasting.

## 2018-05-24 DIAGNOSIS — E782 Mixed hyperlipidemia: Secondary | ICD-10-CM | POA: Diagnosis not present

## 2018-05-24 DIAGNOSIS — I10 Essential (primary) hypertension: Secondary | ICD-10-CM | POA: Diagnosis not present

## 2018-05-24 DIAGNOSIS — R7303 Prediabetes: Secondary | ICD-10-CM | POA: Diagnosis not present

## 2018-05-24 DIAGNOSIS — D508 Other iron deficiency anemias: Secondary | ICD-10-CM | POA: Diagnosis not present

## 2018-05-24 NOTE — Progress Notes (Deleted)
Subjective:   Cynthia Adams is a 73 y.o. female who presents for Medicare Annual (Subsequent) preventive examination.  Review of Systems:  No ROS.  Medicare Wellness Visit. Additional risk factors are reflected in the social history.    Sleep patterns:    Home Safety/Smoke Alarms: Feels safe in home. Smoke alarms in place.  Living environment;  Seat Belt Safety/Bike Helmet: Wears seat belt.   Female:   Pap-  Aged out     Mammo-  utd     Dexa scan-  utd      CCS- utd    Objective:     Vitals: There were no vitals taken for this visit.  There is no height or weight on file to calculate BMI.  Advanced Directives 05/28/2016 05/28/2016 08/16/2014 05/13/2014  Does Patient Have a Medical Advance Directive? - Yes Yes No  Type of Paramedic of [place];Living will [place];Living will Out of facility DNR (pink MOST or yellow form) -  Does patient want to make changes to medical advance directive? - - No - Patient declined -  Copy of [place] in Chart? No - copy requested No - copy requested Yes -  Would patient like information on creating a medical advance directive? - - - No - patient declined information    Tobacco Social History   Tobacco Use  Smoking Status Never Smoker  Smokeless Tobacco Never Used     Counseling given: Not Answered   Clinical Intake:                       Past Medical History:  Diagnosis Date  . Cataract 2017   Cataract surgery in 01-2016 and 02-2016  . GERD (gastroesophageal reflux disease)   . Hyperlipidemia   . Hypertension   . Nerve pain   . Seasonal allergies    Past Surgical History:  Procedure Laterality Date  . ABDOMINAL HYSTERECTOMY    . BREAST BIOPSY    . BREAST CYST EXCISION    . CATARACT EXTRACTION W/ INTRAOCULAR LENS  IMPLANT, BILATERAL Bilateral 01-2016, 02-2016  . CATARACT EXTRACTION W/ INTRAOCULAR LENS & ANTERIOR VITRECTOMY Bilateral 01-29-2016,  02-19-2016   Right eye in 01-2016 and Left eye 02-2016  . CESAREAN SECTION    . CESAREAN SECTION    . CHOLECYSTECTOMY    . LUMBAR DISC SURGERY    . TONSILLECTOMY     Family History  Problem Relation Age of Onset  . Cancer Mother        breast  . Dementia Mother   . Hypertension Mother   . Hyperlipidemia Mother   . Breast cancer Mother   . Dementia Sister   . Hypertension Sister   . Hyperlipidemia Sister   . Hyperlipidemia Brother   . Hypertension Brother   . Hypertension Son   . Dementia Maternal Aunt   . Breast cancer Maternal Aunt   . Cancer Paternal Uncle   . Alcohol abuse Paternal Uncle   . Stroke Maternal Grandmother   . Heart disease Paternal Grandmother   . Hyperlipidemia Sister   . Hypertension Sister   . Hyperlipidemia Sister   . Hypertension Sister   . Dementia Maternal Aunt   . Breast cancer Maternal Aunt   . Dementia Maternal Aunt   . Breast cancer Maternal Aunt   . Alcohol abuse Paternal Uncle    Social History   Socioeconomic History  . Marital status: Married  Spouse name: Not on file  . Number of children: Not on file  . Years of education: Not on file  . Highest education level: Not on file  Occupational History  . Not on file  Social Needs  . Financial resource strain: Not on file  . Food insecurity:    Worry: Not on file    Inability: Not on file  . Transportation needs:    Medical: Not on file    Non-medical: Not on file  Tobacco Use  . Smoking status: Never Smoker  . Smokeless tobacco: Never Used  Substance and Sexual Activity  . Alcohol use: Yes    Comment: may have a glass of wine but very, very seldom.  . Drug use: No  . Sexual activity: Not Currently  Lifestyle  . Physical activity:    Days per week: Not on file    Minutes per session: Not on file  . Stress: Not on file  Relationships  . Social connections:    Talks on phone: Not on file    Gets together: Not on file    Attends religious service: Not on file     Active member of club or organization: Not on file    Attends meetings of clubs or organizations: Not on file    Relationship status: Not on file  Other Topics Concern  . Not on file  Social History Narrative  . Not on file    Outpatient Encounter Medications as of 05/29/2018  Medication Sig  . aspirin 81 MG tablet Take 81 mg by mouth daily.  . Cyanocobalamin (B-12 PO) Take by mouth.  . estradiol (ESTRACE) 0.5 MG tablet Take 0.5 tablets (0.25 mg total) by mouth daily.  . fluocinolone (VANOS) 0.01 % cream Apply topically 2 (two) times daily. To affected area(s) as needed for mild facial rash  . fluticasone (FLONASE) 50 MCG/ACT nasal spray USE 2 SPRAYS IN EACH  NOSTRIL DAILY  . gabapentin (NEURONTIN) 600 MG tablet Take 1 tablet (600 mg total) by mouth 3 (three) times daily.  Marland Kitchen gemfibrozil (LOPID) 600 MG tablet TAKE 1 TABLET BY MOUTH 2  TIMES DAILY BEFORE A MEAL.  Marland Kitchen HYDROcodone-acetaminophen (NORCO) 10-325 MG tablet Take 1 tablet by mouth 3 (three) times daily.  Marland Kitchen ketoconazole (NIZORAL) 2 % cream Apply 1 application topically 2 (two) times daily. To affected areas.  . loratadine (CLARITIN) 10 MG tablet Take 10 mg by mouth daily.  . metoprolol tartrate (LOPRESSOR) 100 MG tablet TAKE 1 TABLET BY MOUTH TWO  TIMES DAILY  . MULTIPLE VITAMIN PO Take by mouth.  . Multiple Vitamins-Minerals (MULTIVITAMIN WITH MINERALS) tablet Take 1 tablet by mouth daily.   No facility-administered encounter medications on file as of 05/29/2018.     Activities of Daily Living No flowsheet data found.  Patient Care Team: Gregor Hams, MD as PCP - General (Family Medicine)    Assessment:   This is a routine wellness examination for Taylia.Physical assessment deferred to PCP.   Exercise Activities and Dietary recommendations   Diet  Breakfast: Lunch:  Dinner:       Goals    . <enter goal here> (pt-stated)       Fall Risk Fall Risk  09/27/2017 05/28/2016 05/28/2016  Falls in the past year? No Yes Yes   Number falls in past yr: - 1 1  Injury with Fall? - Yes Yes  Comment - Lots of bruising but no sprains, fx. Only a lot of bruising  Follow up -  Falls prevention discussed Falls prevention discussed  Comment - Discussed to watch for any uneven ground when outside. Discussed to be aware of change in landscapes slick shoes on grass.   Is the patient's home free of loose throw rugs in walkways, pet beds, electrical cords, etc?   {Blank single:19197::"yes","no"}      Grab bars in the bathroom? {Blank single:19197::"yes","no"}      Handrails on the stairs?   {Blank single:19197::"yes","no"}      Adequate lighting?   {Blank single:19197::"yes","no"}  Depression Screen PHQ 2/9 Scores 09/27/2017 05/28/2016 05/28/2016 08/18/2015  PHQ - 2 Score 0 1 0 0     Cognitive Function     6CIT Screen 05/28/2016 05/28/2016  What Year? 0 points 0 points  What month? 0 points 0 points  What time? 0 points 0 points  Count back from 20 0 points 0 points  Months in reverse 0 points 0 points  Repeat phrase 0 points 0 points  Total Score 0 0    Immunization History  Administered Date(s) Administered  . Influenza, High Dose Seasonal PF 02/26/2016, 01/25/2018  . Influenza-Unspecified 02/04/2012, 01/09/2015, 02/21/2017  . Pneumococcal Conjugate-13 02/26/2016  . Pneumococcal Polysaccharide-23 05/08/2006, 05/25/2017  . Td 05/10/2004  . Tdap 08/16/2014  . Zoster 03/17/2015  . Zoster Recombinat (Shingrix) 07/28/2017    Screening Tests Health Maintenance  Topic Date Due  . MAMMOGRAM  04/12/2020  . TETANUS/TDAP  08/15/2024  . COLONOSCOPY  10/02/2024  . INFLUENZA VACCINE  Completed  . DEXA SCAN  Completed  . Hepatitis C Screening  Completed  . PNA vac Low Risk Adult  Completed       Plan:   ***   I have personally reviewed and noted the following in the patient's chart:   . Medical and social history . Use of alcohol, tobacco or illicit drugs  . Current medications and supplements . Functional  ability and status . Nutritional status . Physical activity . Advanced directives . List of other physicians . Hospitalizations, surgeries, and ER visits in previous 12 months . Vitals . Screenings to include cognitive, depression, and falls . Referrals and appointments  In addition, I have reviewed and discussed with patient certain preventive protocols, quality metrics, and best practice recommendations. A written personalized care plan for preventive services as well as general preventive health recommendations were provided to patient.     Joanne Chars, LPN  7/34/2876

## 2018-05-25 LAB — CBC WITH DIFFERENTIAL/PLATELET
Absolute Monocytes: 556 cells/uL (ref 200–950)
BASOS ABS: 67 {cells}/uL (ref 0–200)
BASOS PCT: 1 %
EOS PCT: 7.3 %
Eosinophils Absolute: 489 cells/uL (ref 15–500)
HCT: 37 % (ref 35.0–45.0)
Hemoglobin: 12.5 g/dL (ref 11.7–15.5)
Lymphs Abs: 2955 cells/uL (ref 850–3900)
MCH: 31.1 pg (ref 27.0–33.0)
MCHC: 33.8 g/dL (ref 32.0–36.0)
MCV: 92 fL (ref 80.0–100.0)
MONOS PCT: 8.3 %
MPV: 12.7 fL — AB (ref 7.5–12.5)
Neutro Abs: 2633 cells/uL (ref 1500–7800)
Neutrophils Relative %: 39.3 %
Platelets: 283 10*3/uL (ref 140–400)
RBC: 4.02 10*6/uL (ref 3.80–5.10)
RDW: 12.4 % (ref 11.0–15.0)
TOTAL LYMPHOCYTE: 44.1 %
WBC: 6.7 10*3/uL (ref 3.8–10.8)

## 2018-05-25 LAB — LIPID PANEL W/REFLEX DIRECT LDL
CHOLESTEROL: 208 mg/dL — AB (ref <200)
HDL: 42 mg/dL — ABNORMAL LOW (ref >50)
LDL Cholesterol (Calc): 139 mg/dL (calc) — ABNORMAL HIGH
Non-HDL Cholesterol (Calc): 166 mg/dL (calc) — ABNORMAL HIGH (ref <130)
TRIGLYCERIDES: 143 mg/dL (ref <150)
Total CHOL/HDL Ratio: 5 (calc) — ABNORMAL HIGH (ref <5.0)

## 2018-05-25 LAB — COMPLETE METABOLIC PANEL WITH GFR
AG RATIO: 1.8 (calc) (ref 1.0–2.5)
ALKALINE PHOSPHATASE (APISO): 86 U/L (ref 33–130)
ALT: 17 U/L (ref 6–29)
AST: 21 U/L (ref 10–35)
Albumin: 4.6 g/dL (ref 3.6–5.1)
BILIRUBIN TOTAL: 0.3 mg/dL (ref 0.2–1.2)
BUN: 12 mg/dL (ref 7–25)
CHLORIDE: 107 mmol/L (ref 98–110)
CO2: 28 mmol/L (ref 20–32)
Calcium: 9.9 mg/dL (ref 8.6–10.4)
Creat: 0.9 mg/dL (ref 0.60–0.93)
GFR, EST AFRICAN AMERICAN: 74 mL/min/{1.73_m2} (ref >=60)
GFR, Est Non African American: 64 mL/min/{1.73_m2} (ref >=60)
Globulin: 2.5 g/dL (calc) (ref 1.9–3.7)
Glucose, Bld: 113 mg/dL — ABNORMAL HIGH (ref 65–99)
POTASSIUM: 4.2 mmol/L (ref 3.5–5.3)
Sodium: 142 mmol/L (ref 135–146)
TOTAL PROTEIN: 7.1 g/dL (ref 6.1–8.1)

## 2018-05-25 LAB — HEMOGLOBIN A1C
EAG (MMOL/L): 7 (calc)
HEMOGLOBIN A1C: 6 %{Hb} — AB (ref <5.7)
Mean Plasma Glucose: 126 (calc)

## 2018-05-29 ENCOUNTER — Encounter: Payer: Medicare Other | Admitting: Family Medicine

## 2018-05-29 ENCOUNTER — Ambulatory Visit: Payer: Medicare Other

## 2018-05-29 ENCOUNTER — Encounter: Payer: Medicare Other | Admitting: Physician Assistant

## 2018-06-13 ENCOUNTER — Ambulatory Visit (INDEPENDENT_AMBULATORY_CARE_PROVIDER_SITE_OTHER): Payer: Medicare Other | Admitting: *Deleted

## 2018-06-13 ENCOUNTER — Ambulatory Visit (INDEPENDENT_AMBULATORY_CARE_PROVIDER_SITE_OTHER): Payer: Medicare Other | Admitting: Family Medicine

## 2018-06-13 ENCOUNTER — Encounter: Payer: Self-pay | Admitting: Family Medicine

## 2018-06-13 VITALS — BP 127/62 | Ht 60.0 in | Wt 156.0 lb

## 2018-06-13 VITALS — BP 127/62 | HR 63 | Ht 60.0 in | Wt 156.0 lb

## 2018-06-13 DIAGNOSIS — Z0000 Encounter for general adult medical examination without abnormal findings: Secondary | ICD-10-CM

## 2018-06-13 DIAGNOSIS — I10 Essential (primary) hypertension: Secondary | ICD-10-CM

## 2018-06-13 DIAGNOSIS — R7303 Prediabetes: Secondary | ICD-10-CM

## 2018-06-13 DIAGNOSIS — L719 Rosacea, unspecified: Secondary | ICD-10-CM

## 2018-06-13 DIAGNOSIS — E782 Mixed hyperlipidemia: Secondary | ICD-10-CM | POA: Diagnosis not present

## 2018-06-13 DIAGNOSIS — J0100 Acute maxillary sinusitis, unspecified: Secondary | ICD-10-CM | POA: Diagnosis not present

## 2018-06-13 MED ORDER — AZITHROMYCIN 250 MG PO TABS
250.0000 mg | ORAL_TABLET | Freq: Every day | ORAL | 0 refills | Status: DC
Start: 2018-06-13 — End: 2018-07-13

## 2018-06-13 MED ORDER — METRONIDAZOLE 0.75 % EX GEL: 1.0000 "application " | Freq: Two times a day (BID) | CUTANEOUS | 3 refills | Status: DC

## 2018-06-13 MED ORDER — GABAPENTIN 600 MG PO TABS: ORAL_TABLET | ORAL | 3 refills | Status: DC

## 2018-06-13 MED ORDER — METOPROLOL TARTRATE 50 MG PO TABS: 50.0000 mg | ORAL_TABLET | Freq: Two times a day (BID) | ORAL | 3 refills | Status: DC

## 2018-06-13 NOTE — Progress Notes (Addendum)
Subjective:   Cynthia Adams is a 73 y.o. female who presents for Medicare Annual (Subsequent) preventive examination.  Review of Systems:  No ROS.  Medicare Wellness Visit. Additional risk factors are reflected in the social history.  Cardiac Risk Factors include: advanced age (>64men, >15 women);hypertension;dyslipidemia;sedentary lifestyle Sleep patterns: Getting 6-7 hours of sleep a night. Does not wake up to go to the bathroom. Feels rested upon wakening.  Home Safety/Smoke Alarms: Feels safe in home. Smoke alarms in place.  Living environment;  Lives with husband in 1 story with basement and hand rails are on the steps. Shower is a step over tub and grab bars are in place  [employer] Safety/Bike Helmet: Wears seat belt.   Female:   Pap- aged out      [place]- utd      Dexa scan-  utd      CCS- utd       Objective:     Vitals: BP 127/62 (BP Location: Left Arm, Patient Position: Sitting, Cuff Size: Normal)   Ht 5' (1.524 m)   Wt 156 lb (70.8 kg)   SpO2 98%   BMI 30.47 kg/m   Body mass index is 30.47 kg/m.  Advanced Directives 06/13/2018 05/28/2016 05/28/2016 08/16/2014 05/13/2014  Does Patient Have a Medical Advance Directive? Yes - Yes Yes No  Type of Paramedic of [place];Living will [place];Living will [place];Living will Out of facility DNR (pink MOST or yellow form) -  Does patient want to make changes to medical advance directive? No - Patient declined - - No - Patient declined -  Copy of [place] in Chart? No - copy requested No - copy requested No - copy requested Yes -  Would patient like information on creating a medical advance directive? - - - - No - patient declined information    Tobacco Social History   Tobacco Use  Smoking Status Never Smoker  Smokeless Tobacco Never Used     Counseling given: Not Answered   Clinical Intake:  Pre-visit preparation completed:  Yes  Pain : No/denies pain     Nutritional Risks: None Diabetes: No  How often do you need to have someone help you when you read instructions, pamphlets, or other written materials from your doctor or pharmacy?: 1 - Never What is the last grade level you completed in school?: 12  Interpreter Needed?: No  Information entered by :: Orlie Dakin, LPN  Past Medical History:  Diagnosis Date  . Cataract 2017   Cataract surgery in 01-2016 and 02-2016  . GERD (gastroesophageal reflux disease)   . History of iron deficiency 06/13/2014  . Hyperlipidemia   . Hypertension   . Nerve pain   . Seasonal allergies    Past Surgical History:  Procedure Laterality Date  . ABDOMINAL HYSTERECTOMY    . BREAST BIOPSY    . BREAST CYST EXCISION    . CATARACT EXTRACTION W/ INTRAOCULAR LENS  IMPLANT, BILATERAL Bilateral 01-2016, 02-2016  . CATARACT EXTRACTION W/ INTRAOCULAR LENS & ANTERIOR VITRECTOMY Bilateral 01-29-2016, 02-19-2016   Right eye in 01-2016 and Left eye 02-2016  . CESAREAN SECTION    . CESAREAN SECTION    . CHOLECYSTECTOMY    . LUMBAR DISC SURGERY    . TONSILLECTOMY     Family History  Problem Relation Age of Onset  . Cancer Mother        breast  . Dementia Mother   .  Hypertension Mother   . Hyperlipidemia Mother   . Breast cancer Mother   . Dementia Sister   . Hypertension Sister   . Hyperlipidemia Sister   . Hyperlipidemia Brother   . Hypertension Brother   . Hypertension Son   . Dementia Maternal Aunt   . Breast cancer Maternal Aunt   . Cancer Paternal Uncle   . Alcohol abuse Paternal Uncle   . Stroke Maternal Grandmother   . Heart disease Paternal Grandmother   . Hyperlipidemia Sister   . Hypertension Sister   . Hyperlipidemia Sister   . Hypertension Sister   . Dementia Maternal Aunt   . Breast cancer Maternal Aunt   . Dementia Maternal Aunt   . Breast cancer Maternal Aunt   . Alcohol abuse Paternal Uncle    Social History   Socioeconomic History  .  Marital status: Married    Spouse name: Jenny Reichmann  . Number of children: 2  . Years of education: 1  . Highest education level: 12th grade  Occupational History  . 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 Financial resource strain: Not hard at all  . Food insecurity:    Worry: Never true    Inability: Never true  . Transportation needs:    Medical: No    Non-medical: No  Tobacco Use  . Smoking status: Never Smoker  . Smokeless tobacco: Never Used  Substance and Sexual Activity  . Alcohol use: Yes    Comment: may have a glass of wine but very, very seldom.  . Drug use: No  . Sexual activity: Not Currently  Lifestyle  . Physical activity:    Days per week: 0 days    Minutes per session: 0 min  . Stress: Not at all  Relationships  . Social connections:    Talks on phone: More than three times a week    Gets together: Once a week    Attends religious service: More than 4 times per year    Active member of club or organization: Yes    Attends meetings of clubs or organizations: More than 4 times per year    Relationship status: Married  Other Topics Concern  . Not on file  Social History Narrative   Goes out for birthdays, meets with class of 66 every month. Wants to get back into exercising. Works on one day of the week    Outpatient Encounter Medications as of 06/13/2018  Medication Sig  . aspirin 81 MG tablet Take 81 mg by mouth daily.  . Cyanocobalamin (B-12 PO) Take by mouth.  . estradiol (ESTRACE) 0.5 MG tablet Take 0.5 tablets (0.25 mg total) by mouth daily.  . fluocinolone (VANOS) 0.01 % cream Apply topically 2 (two) times daily. To affected area(s) as needed for mild facial rash  . fluticasone (FLONASE) 50 MCG/ACT nasal spray USE 2 SPRAYS IN EACH  NOSTRIL DAILY  . gabapentin (NEURONTIN) 600 MG tablet Take 1 tablet (600 mg total) by mouth 3 (three) times daily. (Patient taking differently: Take 600 mg by mouth 3 (three) times daily. Take one in  the morning, one at noon and 2 at night)  . gemfibrozil (LOPID) 600 MG tablet TAKE 1 TABLET BY MOUTH 2  TIMES DAILY BEFORE A MEAL.  Marland Kitchen HYDROcodone-acetaminophen (NORCO) 10-325 MG tablet Take 1 tablet by mouth 3 (three) times daily.  Marland Kitchen loratadine (CLARITIN) 10 MG tablet Take 10 mg by mouth daily.  . metoprolol tartrate (LOPRESSOR) 100  MG tablet TAKE 1 TABLET BY MOUTH TWO  TIMES DAILY (Patient taking differently: Take 1/2 tab twice a day)  . MULTIPLE VITAMIN PO Take by mouth.  . Multiple Vitamins-Minerals (MULTIVITAMIN WITH MINERALS) tablet Take 1 tablet by mouth daily.  . Wheat Dextrin (BENEFIBER DRINK MIX PO) Take by mouth.  Marland Kitchen ketoconazole (NIZORAL) 2 % cream Apply 1 application topically 2 (two) times daily. To affected areas. (Patient not taking: Reported on 06/13/2018)   No facility-administered encounter medications on file as of 06/13/2018.     Activities of Daily Living In your present state of health, do you have any difficulty performing the following activities: 06/13/2018  Hearing? N  Vision? N  Difficulty concentrating or making decisions? N  Walking or climbing stairs? N  Dressing or bathing? N  Doing errands, shopping? N  Preparing Food and eating ? N  Using the Toilet? N  In the past six months, have you accidently leaked urine? N  Do you have problems with loss of bowel control? N  Managing your Medications? N  Managing your Finances? N  Housekeeping or managing your Housekeeping? N  Some recent data might be hidden    Patient Care Team: Gregor Hams, MD as PCP - General (Family Medicine)    Assessment:   This is a routine wellness examination for Cynthia Adams.Physical assessment deferred to PCP.   Exercise Activities and Dietary recommendations Current Exercise Habits: The patient does not participate in regular exercise at present, Exercise limited by: None identified. Eats a lot of vegetables and fruits. Diet  Breakfast: skips Lunch: sometimes skips. Eats a sandwich if  eats. Dinner:  Meats and vegetables. Salads. Does go out to eat a few times a week. Instructed on trying to improve diet and eat small meals 3 times a day.    Goals    . <enter goal here> (pt-stated)    . Exercise 3x per week (30 min per time)     Start back walking 3 times a week for at least 30 minutes at a time       Fall Risk Fall Risk  06/13/2018 09/27/2017 05/28/2016 05/28/2016  Falls in the past year? 0 No Yes Yes  Number falls in past yr: - - 1 1  Injury with Fall? - - Yes Yes  Comment - - Lots of bruising but no sprains, fx. Only a lot of bruising  Follow up Falls prevention discussed - Falls prevention discussed Falls prevention discussed  Comment - - Discussed to watch for any uneven ground when outside. Discussed to be aware of change in landscapes slick shoes on grass.   Is the patient's home free of loose throw rugs in walkways, pet beds, electrical cords, etc?   yes      Grab bars in the bathroom? yes      Handrails on the stairs?   yes      Adequate lighting?   yes  Depression Screen PHQ 2/9 Scores 06/13/2018 09/27/2017 05/28/2016 05/28/2016  PHQ - 2 Score 0 0 1 0     Cognitive Function     6CIT Screen 06/13/2018 05/28/2016 05/28/2016  What Year? 0 points 0 points 0 points  What month? 0 points 0 points 0 points  What time? 0 points 0 points 0 points  Count back from 20 0 points 0 points 0 points  Months in reverse 0 points 0 points 0 points  Repeat phrase 0 points 0 points 0 points  Total Score 0 0 0  Immunization History  Administered Date(s) Administered  . Influenza, High Dose Seasonal PF 02/26/2016, 01/25/2018  . Influenza-Unspecified 02/04/2012, 01/09/2015, 02/21/2017  . Pneumococcal Conjugate-13 02/26/2016  . Pneumococcal Polysaccharide-23 05/08/2006, 05/25/2017  . Td 05/10/2004  . Tdap 08/16/2014  . Zoster 03/17/2015  . Zoster Recombinat (Shingrix) 07/28/2017    Screening Tests Health Maintenance  Topic Date Due  . MAMMOGRAM  04/12/2020  .  TETANUS/TDAP  08/15/2024  . COLONOSCOPY  10/02/2024  . INFLUENZA VACCINE  Completed  . DEXA SCAN  Completed  . Hepatitis C Screening  Completed  . PNA vac Low Risk Adult  Completed       Plan:      Cynthia Adams , Thank you for taking time to come for your Medicare Wellness Visit. I appreciate your ongoing commitment to your health goals. Please review the following plan we discussed and let me know if I can assist you in the future.  Please schedule your next medicare wellness visit with me in 1 yr. Continue doing brain stimulating activities (puzzles, reading, adult coloring books, staying active) to keep memory sharp.  Cynthia Adams , Thank you for taking time to come for your Medicare Wellness Visit. I appreciate your ongoing commitment to your health goals. Please review the following plan we discussed and let me know if I can assist you in the future.  Please schedule your next medicare wellness visit with me in 1 yr. Continue doing brain stimulating activities (puzzles, reading, adult coloring books, staying active) to keep memory sharp.   These are the goals we discussed: Goals    . <enter goal here> (pt-stated)    . Exercise 3x per week (30 min per time)     Start back walking 3 times a week for at least 30 minutes at a time       This is a list of the screening recommended for you and due dates:  Health Maintenance  Topic Date Due  . Mammogram  04/12/2020  . Tetanus Vaccine  08/15/2024  . Colon Cancer Screening  10/02/2024  . Flu Shot  Completed  . DEXA scan (bone density measurement)  Completed  .  Hepatitis C: One time screening is recommended by Center for Disease Control  (CDC) for  adults born from [birth year] through 1965.   Completed  . Pneumonia vaccines  Completed      I have personally reviewed and noted the following in the patient's chart:   . Medical and social history . Use of alcohol, tobacco or illicit drugs  . Current medications and  supplements . Functional ability and status . Nutritional status . Physical activity . Advanced directives . List of other physicians . Hospitalizations, surgeries, and ER visits in previous 12 months . Vitals . Screenings to include cognitive, depression, and falls . Referrals and appointments  In addition, I have reviewed and discussed with patient certain preventive protocols, quality metrics, and best practice recommendations. A written personalized care plan for preventive services as well as general preventive health recommendations were provided to patient.     Joanne Chars, LPN  0/01/9832   Medical screening examination/treatment was performed by qualified clinical staff member and as supervising physician I was immediately available for consultation/collaboration. I have reviewed documentation and agree with assessment and plan.  Lynne Leader, MD

## 2018-06-13 NOTE — Progress Notes (Signed)
Cynthia Adams is a 73 y.o. female who presents to [facility] today for well adult visit.   Cynthia Adams is doing reasonably well.  She is not exercising regularly but does have access to exercise with a gym membership.  She has done walking in the past and would like to restart walking.  Additionally she eats typically 1 or 2 meals a day and often skips meals.  She tries to avoid snacking at bedtime.  She takes hydrocodone and gabapentin for chronic leg pain from lumbar surgery.  She is managed with pain management.  She like to wean off of hydrocodone if possible in the future.  She notes a 2-week history of right-sided sinus pain and pressure associated with bloody discharge.  He says her symptoms are consistent with previous episodes of sinusitis.  She is tried some over-the-counter medications which helped a little.  Additionally she notes a rash on her face.  She notes small red dots on her face.  She has been seen for this previously by my partner Dr. Sheppard Adams who prescribed steroid cream which helped a little.  She notes the rash is still present somewhat.  Additionally she notes that she has had trouble tolerating statins in the past.  ROS as above:  Past Medical History:  Diagnosis Date  . Cataract 2017   Cataract surgery in 01-2016 and 02-2016  . GERD (gastroesophageal reflux disease)   . History of iron deficiency 06/13/2014  . Hyperlipidemia   . Hypertension   . Nerve pain   . Seasonal allergies    Past Surgical History:  Procedure Laterality Date  . ABDOMINAL HYSTERECTOMY    . BREAST BIOPSY    . BREAST CYST EXCISION    . CATARACT EXTRACTION W/ INTRAOCULAR LENS  IMPLANT, BILATERAL Bilateral 01-2016, 02-2016  . CATARACT EXTRACTION W/ INTRAOCULAR LENS & ANTERIOR VITRECTOMY Bilateral 01-29-2016, 02-19-2016   Right eye in 01-2016 and Left eye 02-2016  .  CESAREAN SECTION    . CESAREAN SECTION    . CHOLECYSTECTOMY    . LUMBAR DISC SURGERY    . TONSILLECTOMY     Social History   Tobacco Use  . Smoking status: Never Smoker  . Smokeless tobacco: Never Used  Substance Use Topics  . Alcohol use: Yes    Comment: may have a glass of wine but very, very seldom.   family history includes Alcohol abuse in her paternal uncle and paternal uncle; Breast cancer in her maternal aunt, maternal aunt, maternal aunt, and mother; Cancer in her mother and paternal uncle; Dementia in her maternal aunt, maternal aunt, maternal aunt, mother, and sister; Heart disease in her paternal grandmother; Hyperlipidemia in her brother, mother, sister, sister, and sister; Hypertension in her brother, mother, sister, sister, sister, and son; Stroke in her maternal grandmother.  Medications: Current Outpatient Medications  Medication Sig Dispense Refill  . aspirin 81 MG tablet Take 81 mg by mouth daily.    . Cyanocobalamin (B-12 PO) Take by mouth.    . estradiol (ESTRACE) 0.5 MG tablet Take 0.5 tablets (0.25 mg total) by mouth daily. 45 tablet 0  . fluocinolone (VANOS) 0.01 % cream Apply topically 2 (two) times daily. To affected area(s) as needed for mild facial rash 15 g 0  . fluticasone (FLONASE) 50 MCG/ACT nasal spray USE 2 SPRAYS IN EACH  NOSTRIL DAILY 48 g 3  . gabapentin (NEURONTIN) 600 MG tablet 1 po am, 1 po lunch  and 2 po qhs 450 tablet 3  . gemfibrozil (LOPID) 600 MG tablet TAKE 1 TABLET BY MOUTH 2  TIMES DAILY BEFORE A MEAL. 180 tablet 3  . HYDROcodone-acetaminophen (NORCO) 10-325 MG tablet Take 1 tablet by mouth 3 (three) times daily.    Marland Kitchen loratadine (CLARITIN) 10 MG tablet Take 10 mg by mouth daily.    . MULTIPLE VITAMIN PO Take by mouth.    . Multiple Vitamins-Minerals (MULTIVITAMIN WITH MINERALS) tablet Take 1 tablet by mouth daily.    . Wheat Dextrin (BENEFIBER DRINK MIX PO) Take by mouth.    Marland Kitchen azithromycin (ZITHROMAX) 250 MG tablet Take 1 tablet (250 mg  total) by mouth daily. Take first 2 tablets together, then 1 every day until finished. 6 tablet 0  . metoprolol tartrate (LOPRESSOR) 50 MG tablet Take 1 tablet (50 mg total) by mouth 2 (two) times daily. 180 tablet 3  . metroNIDAZOLE (METROGEL) 0.75 % gel Apply 1 application topically 2 (two) times daily. 45 g 3  . Psyllium (FIBER) 0.52 g CAPS Take by mouth.     No current facility-administered medications for this visit.    Allergies  Allergen Reactions  . Minocycline Diarrhea, Nausea And Vomiting and Other (See Comments)    Stomach pain    . Sulfa Antibiotics Swelling  . Amoxicillin-Pot Clavulanate Diarrhea  . Lipitor [Atorvastatin] Other (See Comments)    Diarrhea and insomnia  . Pravastatin Diarrhea    Health Maintenance Health Maintenance  Topic Date Due  . MAMMOGRAM  04/12/2020  . TETANUS/TDAP  08/15/2024  . COLONOSCOPY  10/02/2024  . INFLUENZA VACCINE  Completed  . DEXA SCAN  Completed  . Hepatitis C Screening  Completed  . PNA vac Low Risk Adult  Completed     Exam:  BP 127/62   Pulse 63   Ht 5' (1.524 m)   Wt 156 lb (70.8 kg)   BMI 30.47 kg/m  Wt Readings from Last 5 Encounters:  06/13/18 156 lb (70.8 kg)  06/13/18 156 lb (70.8 kg)  04/21/18 154 lb 9.6 oz (70.1 kg)  10/04/17 157 lb (71.2 kg)  09/27/17 155 lb (70.3 kg)      Gen: Well NAD HEENT: EOMI,  MMM Lungs: Normal work of breathing. CTABL Heart: RRR no MRG Abd: NABS, Soft. Nondistended, Nontender Exts: Brisk capillary refill, warm and well perfused.  Psych: Normal speech thought process and affect.  Alert and oriented. Skin: Small erythematous papules face present in macular pattern;.  Depression screen Cynthia Adams 2/9 06/13/2018 09/27/2017 05/28/2016 05/28/2016 08/18/2015  Decreased Interest 0 0 0 0 0  Down, Depressed, Hopeless 0 0 1 0 0  PHQ - 2 Score 0 0 1 0 0     The 10-year ASCVD risk score Cynthia Bussing DC Jr., et al., 2013) is: 15.8%   Values used to calculate the score:     Age: 67 years     Sex:  Female     Is Non-Hispanic African American: No     Diabetic: No     Tobacco smoker: No     Systolic Blood Pressure: 408 mmHg     Is BP treated: Yes     HDL Cholesterol: 42 mg/dL     Total Cholesterol: 208 mg/dL   Lab and Radiology Results Recent Results (from the past 2160 hour(s))  CBC w/Diff/Platelet     Status: Abnormal   Collection Time: 05/24/18  9:09 AM  Result Value Ref Range   WBC 6.7 3.8 - 10.8 Thousand/uL  RBC 4.02 3.80 - 5.10 Million/uL   Hemoglobin 12.5 11.7 - 15.5 g/dL   HCT 37.0 35.0 - 45.0 %   MCV 92.0 80.0 - 100.0 fL   MCH 31.1 27.0 - 33.0 pg   MCHC 33.8 32.0 - 36.0 g/dL   RDW 12.4 11.0 - 15.0 %   Platelets 283 140 - 400 Thousand/uL   MPV 12.7 (H) 7.5 - 12.5 fL   Neutro Abs 2,633 1,500 - 7,800 cells/uL   Lymphs Abs 2,955 850 - 3,900 cells/uL   Absolute Monocytes 556 200 - 950 cells/uL   Eosinophils Absolute 489 15 - 500 cells/uL   Basophils Absolute 67 0 - 200 cells/uL   Neutrophils Relative % 39.3 %   Total Lymphocyte 44.1 %   Monocytes Relative 8.3 %   Eosinophils Relative 7.3 %   Basophils Relative 1.0 %  HgB A1c     Status: Abnormal   Collection Time: 05/24/18  9:09 AM  Result Value Ref Range   Hgb A1c MFr Bld 6.0 (H) <5.7 % of total Hgb    Comment: For someone without known diabetes, a hemoglobin  A1c value between 5.7% and 6.4% is consistent with prediabetes and should be confirmed with a  follow-up test. . For someone with known diabetes, a value <7% indicates that their diabetes is well controlled. A1c targets should be individualized based on duration of diabetes, age, comorbid conditions, and other considerations. . This assay result is consistent with an increased risk of diabetes. . Currently, no consensus exists regarding use of hemoglobin A1c for diagnosis of diabetes for children. .    Mean Plasma Glucose 126 (calc)   eAG (mmol/L) 7.0 (calc)  COMPLETE METABOLIC PANEL WITH GFR     Status: Abnormal   Collection Time: 05/24/18   9:09 AM  Result Value Ref Range   Glucose, Bld 113 (H) 65 - 99 mg/dL    Comment: .            Fasting reference interval . For someone without known diabetes, a glucose value between 100 and 125 mg/dL is consistent with prediabetes and should be confirmed with a follow-up test. .    BUN 12 7 - 25 mg/dL   Creat 0.90 0.60 - 0.93 mg/dL    Comment: For patients >53 years of age, the reference limit for Creatinine is approximately 13% higher for people identified as African-American. .    GFR, Est Non African American 64 > OR = 60 mL/min/1.47m2   GFR, Est African American 74 > OR = 60 mL/min/1.76m2   BUN/Creatinine Ratio NOT APPLICABLE 6 - 22 (calc)   Sodium 142 135 - 146 mmol/L   Potassium 4.2 3.5 - 5.3 mmol/L   Chloride 107 98 - 110 mmol/L   CO2 28 20 - 32 mmol/L   Calcium 9.9 8.6 - 10.4 mg/dL   Total Protein 7.1 6.1 - 8.1 g/dL   Albumin 4.6 3.6 - 5.1 g/dL   Globulin 2.5 1.9 - 3.7 g/dL (calc)   AG Ratio 1.8 1.0 - 2.5 (calc)   Total Bilirubin 0.3 0.2 - 1.2 mg/dL   Alkaline phosphatase (APISO) 86 33 - 130 U/L   AST 21 10 - 35 U/L   ALT 17 6 - 29 U/L  Lipid Panel w/reflex Direct LDL     Status: Abnormal   Collection Time: 05/24/18  9:09 AM  Result Value Ref Range   Cholesterol 208 (H) <200 mg/dL   HDL 42 (L) >50 mg/dL   Triglycerides  143 <150 mg/dL   LDL Cholesterol (Calc) 139 (H) mg/dL (calc)    Comment: Reference range: <100 . Desirable range <100 mg/dL for primary prevention;   <70 mg/dL for patients with CHD or diabetic patients  with > or = 2 CHD risk factors. Marland Kitchen LDL-C is now calculated using the Martin-Hopkins  calculation, which is a validated novel method providing  better accuracy than the Friedewald equation in the  estimation of LDL-C.  Cresenciano Genre et al. Annamaria Helling. 4503;888(28): 2061-2068  (http://education.[facility].com/faq/FAQ164)    Total CHOL/HDL Ratio 5.0 (H) <5.0 (calc)   Non-HDL Cholesterol (Calc) 166 (H) <130 mg/dL (calc)    Comment: For patients  with diabetes plus 1 major ASCVD risk  factor, treating to a non-HDL-C goal of <100 mg/dL  (LDL-C of <70 mg/dL) is considered a therapeutic  option.       Assessment and Plan: 73 y.o. female with  Well adult.  Doing reasonably well.  Plan to increase exercise and work on diet.  Medications updated and prescribed a current taking dosages.  Recommend weaning narcotics with pain management.  I think this is a great idea if it can work.  Additionally will treat sinusitis with azithromycin.  Additionally rash on face likely rosacea.  Will work on metronidazole gel.  Recheck with me in 6 months or sooner if needed.  PDMP reviewed during this encounter. No orders of the defined types were placed in this encounter.  Meds ordered this encounter  Medications  . metoprolol tartrate (LOPRESSOR) 50 MG tablet    Sig: Take 1 tablet (50 mg total) by mouth 2 (two) times daily.    Dispense:  180 tablet    Refill:  3    Replaces 100mg  dose  . gabapentin (NEURONTIN) 600 MG tablet    Sig: 1 po am, 1 po lunch and 2 po qhs    Dispense:  450 tablet    Refill:  3    Fill at next refill  . azithromycin (ZITHROMAX) 250 MG tablet    Sig: Take 1 tablet (250 mg total) by mouth daily. Take first 2 tablets together, then 1 every day until finished.    Dispense:  6 tablet    Refill:  0  . metroNIDAZOLE (METROGEL) 0.75 % gel    Sig: Apply 1 application topically 2 (two) times daily.    Dispense:  45 g    Refill:  3     Discussed warning signs or symptoms. Please see discharge instructions. Patient expresses understanding.

## 2018-06-13 NOTE — Patient Instructions (Addendum)
Cynthia Adams , Thank you for taking time to come for your Medicare Wellness Visit. I appreciate your ongoing commitment to your health goals. Please review the following plan we discussed and let me know if I can assist you in the future.  Please schedule your next medicare wellness visit with me in 1 yr. Continue doing brain stimulating activities (puzzles, reading, adult coloring books, staying active) to keep memory sharp.    These are the goals we discussed: Goals    . <enter goal here> (pt-stated)    . Exercise 3x per week (30 min per time)     Start back walking 3 times a week for at least 30 minutes at a time     Health Maintenance After Age 19 After age 11, you are at a higher risk for certain long-term diseases and infections as well as injuries from falls. Falls are a major cause of broken bones and head injuries in people who are older than age 64. Getting regular preventive care can help to keep you healthy and well. Preventive care includes getting regular testing and making lifestyle changes as recommended by your health care provider. Talk with your health care provider about:  Which screenings and tests you should have. A screening is a test that checks for a disease when you have no symptoms.  A diet and exercise plan that is right for you. What should I know about screenings and tests to prevent falls? Screening and testing are the best ways to find a health problem early. Early diagnosis and treatment give you the best chance of managing medical conditions that are common after age 48. Certain conditions and lifestyle choices may make you more likely to have a fall. Your health care provider may recommend:  Regular vision checks. Poor vision and conditions such as cataracts can make you more likely to have a fall. If you wear glasses, make sure to get your prescription updated if your vision changes.  Medicine review. Work with your health care provider to regularly review  all of the medicines you are taking, including over-the-counter medicines. Ask your health care provider about any side effects that may make you more likely to have a fall. Tell your health care provider if any medicines that you take make you feel dizzy or sleepy.  Osteoporosis screening. Osteoporosis is a condition that causes the bones to get weaker. This can make the bones weak and cause them to break more easily.  Blood pressure screening. Blood pressure changes and medicines to control blood pressure can make you feel dizzy.  Strength and balance checks. Your health care provider may recommend certain tests to check your strength and balance while standing, walking, or changing positions.  Foot health exam. Foot pain and numbness, as well as not wearing proper footwear, can make you more likely to have a fall.  Depression screening. You may be more likely to have a fall if you have a fear of falling, feel emotionally low, or feel unable to do activities that you used to do.  Alcohol use screening. Using too much alcohol can affect your balance and may make you more likely to have a fall. What actions can I take to lower my risk of falls? General instructions  Talk with your health care provider about your risks for falling. Tell your health care provider if: ? You fall. Be sure to tell your health care provider about all falls, even ones that seem minor. ? You feel dizzy,  sleepy, or off-balance.  Take over-the-counter and prescription medicines only as told by your health care provider. These include any supplements.  Eat a healthy diet and maintain a healthy weight. A healthy diet includes low-fat dairy products, low-fat (lean) meats, and fiber from whole grains, beans, and lots of fruits and vegetables. Home safety  Remove any tripping hazards, such as rugs, cords, and clutter.  Install safety equipment such as grab bars in bathrooms and safety rails on stairs.  Keep rooms and  walkways well-lit. Activity   Follow a regular exercise program to stay fit. This will help you maintain your balance. Ask your health care provider what types of exercise are appropriate for you.  If you need a cane or walker, use it as recommended by your health care provider.  Wear supportive shoes that have nonskid soles. Lifestyle  Do not drink alcohol if your health care provider tells you not to drink.  If you drink alcohol, limit how much you have: ? 0-1 drink a day for women. ? 0-2 drinks a day for men.  Be aware of how much alcohol is in your drink. In the [place] one drink equals one typical bottle of beer (12 oz), one-half glass of wine (5 oz), or one shot of hard liquor (1 oz).  Do not use any products that contain nicotine or tobacco, such as cigarettes and e-cigarettes. If you need help quitting, ask your health care provider. Summary  Having a healthy lifestyle and getting preventive care can help to protect your health and wellness after age 40.  Screening and testing are the best way to find a health problem early and help you avoid having a fall. Early diagnosis and treatment give you the best chance for managing medical conditions that are more common for people who are older than age 51.  Falls are a major cause of broken bones and head injuries in people who are older than age 57. Take precautions to prevent a fall at home.  Work with your health care provider to learn what changes you can make to improve your health and wellness and to prevent falls. This information is not intended to replace advice given to you by your health care provider. Make sure you discuss any questions you have with your health care provider. Document Released: 03/09/2017 Document Revised: 03/09/2017 Document Reviewed: 03/09/2017 Elsevier Interactive Patient Education  2019 [employer].

## 2018-06-13 NOTE — Patient Instructions (Addendum)
Thank you for coming in today. I do recommend exercise.  Continue to reduce the dose of opiates per pain management.  Recheck with me in 6 months.  Return sooner if needed.   I recommend walking or water aerobics.   Use the oral antibiotic for sinus infection  USe topical metrogel.  Let me know if not better.    Rosacea Rosacea is a long-term (chronic) condition that affects the skin of the face, including the cheeks, nose, forehead, and chin. This condition can also affect the eyes. Rosacea causes blood vessels near the surface of the skin to enlarge, which results in redness. What are the causes? The cause of this condition is not known. Certain triggers can make rosacea worse, including:  Hot baths.  Exercise.  Sunlight.  Very hot or cold temperatures.  Hot or spicy foods and drinks.  Drinking alcohol.  Stress.  Taking blood pressure medicine.  Long-term use of topical steroids on the face. What increases the risk? You are more likely to develop this condition if you:  Are older than 73 years of age.  Are a woman.  Have light-colored skin (light complexion).  Have a family history of rosacea. What are the signs or symptoms? Symptoms of this condition include:  Redness of the face.  Red bumps or pimples on the face.  A red, enlarged nose.  Blushing easily.  Red lines on the skin.  Irritated, burning, or itchy feeling in the eyes.  Swollen eyelids.  Drainage from the eyes.  Feeling like there is something in your eye. How is this diagnosed? This condition is diagnosed with a medical history and physical exam. How is this treated? There is no cure for this condition, but treatment can help to control your symptoms. Your health care provider may recommend that you see a skin specialist (dermatologist). Treatment may include:  Medicines that are applied to the skin or taken by mouth (orally). This can include antibiotic medicines.  Laser  treatment to improve the appearance of the skin.  Surgery. This is rare. Your health care provider will also recommend the best way to take care of your skin. Even after your skin improves, you will likely need to continue treatment to prevent your rosacea from coming back. Follow these instructions at home: Skin care Take care of your skin as told by your health care provider. You may be told to do these things:  Wash your skin gently two or more times each day.  Use mild soap.  Use a sunscreen or sunblock with SPF 30 or greater.  Use gentle cosmetics that are meant for sensitive skin.  Shave with an electric shaver instead of a blade. Lifestyle  Try to keep track of what foods trigger this condition. Avoid any triggers. These may include: ? Spicy foods. ? Seafood. ? Cheese. ? Hot liquids. ? Nuts. ? Chocolate. ? Iodized salt.  Do not drink alcohol.  Avoid extremely cold or hot temperatures.  Try to reduce your stress. If you need help, talk with your health care provider.  When you exercise, do these things to stay cool: ? Limit sun exposure to your face. ? Use a fan. ? Do shorter and more frequent intervals of exercise. General instructions  Take and apply over-the-counter and prescription medicines only as told by your health care provider.  If you were prescribed an antibiotic medicine, apply it or take it as told by your health care provider. Do not stop using the antibiotic even if  your condition improves.  If your eyelids are affected, apply warm compresses to them. Do this as told by your health care provider.  Keep all follow-up visits as told by your health care provider. This is important. Contact a health care provider if:  Your symptoms get worse.  Your symptoms do not improve after 2 months of treatment.  You have new symptoms.  You have any changes in vision or you have problems with your eyes, such as redness or itching.  You feel  depressed.  You lose your appetite.  You have trouble concentrating. Summary  Rosacea is a long-term (chronic) condition that affects the skin of the face, including the cheeks, nose, forehead, and chin.  Take care of your skin as told by your health care provider.  Take and apply over-the-counter and prescription medicines only as told by your health care provider.  Contact a health care provider if your symptoms get worse or if you have any changes in vision or other problems with your eyes, such as redness or itching.  Keep all follow-up visits as told by your health care provider. This is important. This information is not intended to replace advice given to you by your health care provider. Make sure you discuss any questions you have with your health care provider. Document Released: 06/03/2004 Document Revised: 09/28/2017 Document Reviewed: 09/28/2017 Elsevier Interactive Patient Education  2019 [employer].

## 2018-07-11 ENCOUNTER — Telehealth: Payer: Self-pay

## 2018-07-11 NOTE — Telephone Encounter (Signed)
Recommend schedule an appointment.  Treated with antibiotics at last visit.  If not better will need reassessment.

## 2018-07-11 NOTE — Telephone Encounter (Signed)
Patient called stated that she was seen in the office  Couple weeks ago for a sinus infection. She stated that she is still having sinus congestion. Please advise on what patient should do. Rhonda Cunningham,CMA

## 2018-07-12 NOTE — Telephone Encounter (Signed)
Pt declined appt today, has been scheduled for appt tomorrow morning

## 2018-07-13 ENCOUNTER — Ambulatory Visit (INDEPENDENT_AMBULATORY_CARE_PROVIDER_SITE_OTHER): Payer: Medicare Other | Admitting: Family Medicine

## 2018-07-13 ENCOUNTER — Encounter: Payer: Self-pay | Admitting: Family Medicine

## 2018-07-13 VITALS — BP 148/51 | HR 62 | Temp 98.0°F | Wt 155.0 lb

## 2018-07-13 DIAGNOSIS — R042 Hemoptysis: Secondary | ICD-10-CM | POA: Diagnosis not present

## 2018-07-13 DIAGNOSIS — J302 Other seasonal allergic rhinitis: Secondary | ICD-10-CM | POA: Diagnosis not present

## 2018-07-13 DIAGNOSIS — R21 Rash and other nonspecific skin eruption: Secondary | ICD-10-CM

## 2018-07-13 MED ORDER — AZELASTINE HCL 0.1 % NA SOLN: 2.0000 | Freq: Two times a day (BID) | NASAL | 12 refills | Status: DC

## 2018-07-13 NOTE — Progress Notes (Signed)
Cynthia Adams is a 73 y.o. female who presents to [facility]: [place] today for Sports Medicine today for continuing sinusitis.  Sinusitis: Patient was seen 1 month ago for sinusitis.  She was prescribed azithromycin which helped quite a bit.  She notes in the interim she improved immediately with azithromycin but continues to have nasal drainage that sometimes tinged with blood.  She notes nasal congestion as well.  She notes her symptoms currently are not consistent with previous episodes of bacterial sinus infection.    Rosacea: Patient has been seen previously for facial rash thought to be possibly rosacea.  She was treated with metronidazole cream which has not helped at all.  She has a follow-up appointment scheduled with a dermatologist in about 2 months.  She has used facial steroid cream which helped quite a bit in the past.   ROS as above:  Exam:  BP (!) 148/51   Pulse 62   Temp 98 F (36.7 C) (Oral)   Wt 155 lb (70.3 kg)   SpO2 100%   BMI 30.27 kg/m  Wt Readings from Last 5 Encounters:  07/13/18 155 lb (70.3 kg)  06/13/18 156 lb (70.8 kg)  06/13/18 156 lb (70.8 kg)  04/21/18 154 lb 9.6 oz (70.1 kg)  10/04/17 157 lb (71.2 kg)    Gen: Well NAD HEENT: EOMI,  MMM, NLTM BL, Inflamed nasal turbinates with erythema BL, Oropharynx clear Lungs: Normal work of breathing. CTABL Heart: RRR no MRG Abd: NABS, Soft. Nondistended, Nontender Exts: Brisk capillary refill, warm and well perfused Skin: Scattered tiny erythematous papules facial malar pattern.  No macular rash or significant macular erythema.  No induration.  Lab and Radiology Results No results found for this or any previous visit (from the past 72 hour(s)). No results found.    Assessment and Plan: 73 y.o. female with 1 month duration of sinusitis.  Sinusitis: Symptoms have been slowly  improving since taking AZITHROMYCIN 1 month ago.  Due to continued discharge of dried blood and long-term Hx of Fluticasone spray use for seasonal allergies, thinning of nasal mucous membranes seems likely. Recommend stopping Fluticasone for 1 month and switching to Azelastin for 1 month as a replacement. Counseled to use a humidifier at home.  Continue loratadine.  Facial rash: Unclear etiology.  Initially thought to be rosacea.  However patient did not improve with metronidazole.  She has had improvement with steroid creams which is not typical for rosacea.  Plan to continue previously effective cream and follow-up with dermatology as scheduled.  PDMP not reviewed this encounter. No orders of the defined types were placed in this encounter.  Meds ordered this encounter  Medications  . azelastine (ASTELIN) 0.1 % nasal spray    Sig: Place 2 sprays into both nostrils 2 (two) times daily. Use in each nostril as directed    Dispense:  30 mL    Refill:  12     Historical information moved to improve visibility of documentation.  Past Medical History:  Diagnosis Date  . Cataract 2017   Cataract  surgery in 01-2016 and 02-2016  . GERD (gastroesophageal reflux disease)   . History of iron deficiency 06/13/2014  . Hyperlipidemia   . Hypertension   . Nerve pain   . Seasonal allergies    Past Surgical History:  Procedure Laterality Date  . ABDOMINAL HYSTERECTOMY    . BREAST BIOPSY    . BREAST CYST EXCISION    . CATARACT EXTRACTION W/ INTRAOCULAR LENS  IMPLANT, BILATERAL Bilateral 01-2016, 02-2016  . CATARACT EXTRACTION W/ INTRAOCULAR LENS & ANTERIOR VITRECTOMY Bilateral 01-29-2016, 02-19-2016   Right eye in 01-2016 and Left eye 02-2016  . CESAREAN SECTION    . CESAREAN SECTION    . CHOLECYSTECTOMY    . LUMBAR DISC SURGERY    . TONSILLECTOMY     Social History   Tobacco Use  . Smoking status: Never Smoker  . Smokeless tobacco: Never Used  Substance Use Topics  . Alcohol use: Yes     Comment: may have a glass of wine but very, very seldom.   family history includes Alcohol abuse in her paternal uncle and paternal uncle; Breast cancer in her maternal aunt, maternal aunt, maternal aunt, and mother; Cancer in her mother and paternal uncle; Dementia in her maternal aunt, maternal aunt, maternal aunt, mother, and sister; Heart disease in her paternal grandmother; Hyperlipidemia in her brother, mother, sister, sister, and sister; Hypertension in her brother, mother, sister, sister, sister, and son; Stroke in her maternal grandmother.  Medications: Current Outpatient Medications  Medication Sig Dispense Refill  . aspirin 81 MG tablet Take 81 mg by mouth daily.    . Cyanocobalamin (B-12 PO) Take by mouth.    . estradiol (ESTRACE) 0.5 MG tablet Take 0.5 tablets (0.25 mg total) by mouth daily. 45 tablet 0  . fluocinolone (VANOS) 0.01 % cream Apply topically 2 (two) times daily. To affected area(s) as needed for mild facial rash 15 g 0  . fluticasone (FLONASE) 50 MCG/ACT nasal spray USE 2 SPRAYS IN EACH  NOSTRIL DAILY 48 g 3  . gabapentin (NEURONTIN) 600 MG tablet 1 po am, 1 po lunch and 2 po qhs 450 tablet 3  . gemfibrozil (LOPID) 600 MG tablet TAKE 1 TABLET BY MOUTH 2  TIMES DAILY BEFORE A MEAL. 180 tablet 3  . HYDROcodone-acetaminophen (NORCO) 10-325 MG tablet Take 1 tablet by mouth 3 (three) times daily.    Marland Kitchen loratadine (CLARITIN) 10 MG tablet Take 10 mg by mouth daily.    . metoprolol tartrate (LOPRESSOR) 50 MG tablet Take 1 tablet (50 mg total) by mouth 2 (two) times daily. 180 tablet 3  . MULTIPLE VITAMIN PO Take by mouth.    . Multiple Vitamins-Minerals (MULTIVITAMIN WITH MINERALS) tablet Take 1 tablet by mouth daily.    . Psyllium (FIBER) 0.52 g CAPS Take by mouth.    . Wheat Dextrin (BENEFIBER DRINK MIX PO) Take by mouth.    Marland Kitchen azelastine (ASTELIN) 0.1 % nasal spray Place 2 sprays into both nostrils 2 (two) times daily. Use in each nostril as directed 30 mL 12   No current  facility-administered medications for this visit.    Allergies  Allergen Reactions  . Minocycline Diarrhea, Nausea And Vomiting and Other (See Comments)    Stomach pain    . Sulfa Antibiotics Swelling  . Amoxicillin-Pot Clavulanate Diarrhea  . Lipitor [Atorvastatin] Other (See Comments)    Diarrhea and insomnia  . Pravastatin Diarrhea     Discussed warning signs or symptoms. Please see discharge instructions. Patient expresses understanding.  I personally was present and performed or re-performed the history, physical exam and medical decision-making activities of this service and have verified that the service and findings are accurately documented in the student's note. ___________________________________________ Lynne Leader M.D., ABFM., CAQSM. Primary Care and Sports Medicine Adjunct Instructor of [employer]

## 2018-07-13 NOTE — Patient Instructions (Addendum)
Thank you for coming in today.  STOP flonase nasal spray for 1 month.  Use astelin nasal spray for 1 month.  Continue loratidine.  If you develop really bad facial pain and pressure like a previous sinus infection let me know and I will send in antibiotics.  We could also use Montelukast pill (singulair) if your allergies are not controlled.   Use the humidifier.  Also consider using some Vasoline on a qtip to help dry nose.   Allergic Rhinitis, Adult Allergic rhinitis is an allergic reaction that affects the mucous membrane inside the nose. It causes sneezing, a runny or stuffy nose, and the feeling of mucus going down the back of the throat (postnasal drip). Allergic rhinitis can be mild to severe. There are two types of allergic rhinitis:  Seasonal. This type is also called hay fever. It happens only during certain seasons.  Perennial. This type can happen at any time of the year. What are the causes? This condition happens when the body's defense system (immune system) responds to certain harmless substances called allergens as though they were germs.  Seasonal allergic rhinitis is triggered by pollen, which can come from grasses, trees, and weeds. Perennial allergic rhinitis may be caused by:  House dust mites.  Pet dander.  Mold spores. What are the signs or symptoms? Symptoms of this condition include:  Sneezing.  Runny or stuffy nose (nasal congestion).  Postnasal drip.  Itchy nose.  Tearing of the eyes.  Trouble sleeping.  Daytime sleepiness. How is this diagnosed? This condition may be diagnosed based on:  Your medical history.  A physical exam.  Tests to check for related conditions, such as: ? Asthma. ? Pink eye. ? Ear infection. ? Upper respiratory infection.  Tests to find out which allergens trigger your symptoms. These may include skin or blood tests. How is this treated? There is no cure for this condition, but treatment can help control  symptoms. Treatment may include:  Taking medicines that block allergy symptoms, such as antihistamines. Medicine may be given as a shot, nasal spray, or pill.  Avoiding the allergen.  Desensitization. This treatment involves getting ongoing shots until your body becomes less sensitive to the allergen. This treatment may be done if other treatments do not help.  If taking medicine and avoiding the allergen does not work, new, stronger medicines may be prescribed. Follow these instructions at home:  Find out what you are allergic to. Common allergens include smoke, dust, and pollen.  Avoid the things you are allergic to. These are some things you can do to help avoid allergens: ? Replace carpet with wood, tile, or vinyl flooring. Carpet can trap dander and dust. ? Do not smoke. Do not allow smoking in your home. ? Change your heating and air conditioning filter at least once a month. ? During allergy season:  Keep windows closed as much as possible.  Plan outdoor activities when pollen counts are lowest. This is usually during the evening hours.  When coming indoors, change clothing and shower before sitting on furniture or bedding.  Take over-the-counter and prescription medicines only as told by your health care provider.  Keep all follow-up visits as told by your health care provider. This is important. Contact a health care provider if:  You have a fever.  You develop a persistent cough.  You make whistling sounds when you breathe (you wheeze).  Your symptoms interfere with your normal daily activities. Get help right away if:  You have  shortness of breath. Summary  This condition can be managed by taking medicines as directed and avoiding allergens.  Contact your health care provider if you develop a persistent cough or fever.  During allergy season, keep windows closed as much as possible. This information is not intended to replace advice given to you by your  health care provider. Make sure you discuss any questions you have with your health care provider. Document Released: 01/19/2001 Document Revised: 06/03/2016 Document Reviewed: 06/03/2016 Elsevier Interactive Patient Education  2019 [employer].

## 2018-07-16 ENCOUNTER — Other Ambulatory Visit: Payer: Self-pay | Admitting: Family Medicine

## 2018-08-03 DIAGNOSIS — G5603 Carpal tunnel syndrome, bilateral upper limbs: Secondary | ICD-10-CM | POA: Diagnosis not present

## 2018-08-03 DIAGNOSIS — G2581 Restless legs syndrome: Secondary | ICD-10-CM | POA: Diagnosis not present

## 2018-08-03 DIAGNOSIS — G603 Idiopathic progressive neuropathy: Secondary | ICD-10-CM | POA: Diagnosis not present

## 2018-08-03 DIAGNOSIS — M5417 Radiculopathy, lumbosacral region: Secondary | ICD-10-CM | POA: Diagnosis not present

## 2018-08-03 DIAGNOSIS — M5412 Radiculopathy, cervical region: Secondary | ICD-10-CM | POA: Diagnosis not present

## 2018-08-11 IMAGING — MG DIGITAL SCREENING BILATERAL MAMMOGRAM WITH CAD
4 series · 4 of 4 positions shown · non-contrast
Comparison: Previous exam(s).

CLINICAL DATA: Screening.

EXAM:
DIGITAL SCREENING BILATERAL MAMMOGRAM WITH CAD

[L CC]
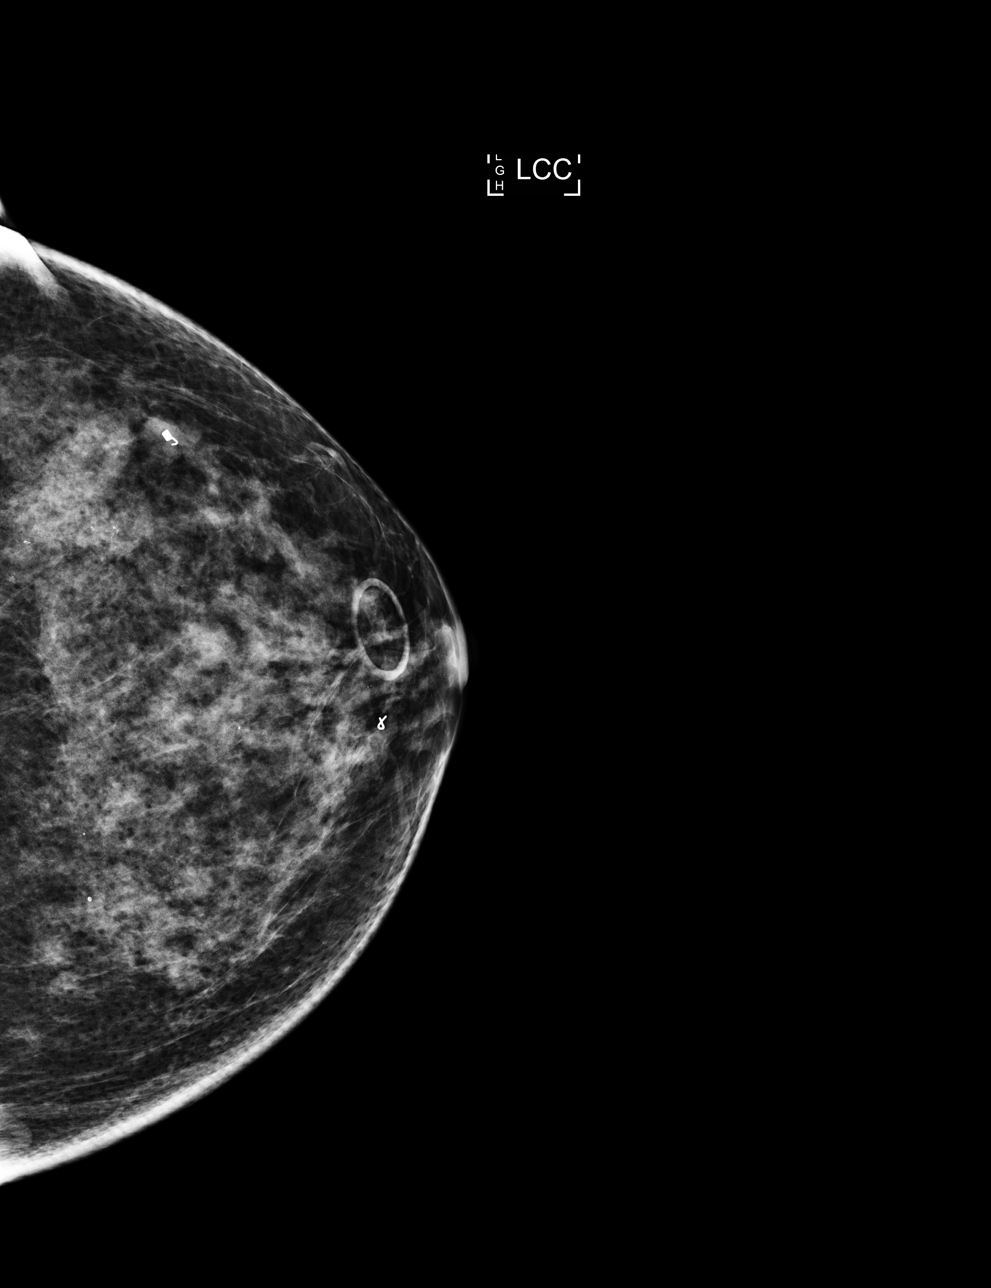

[L MLO]
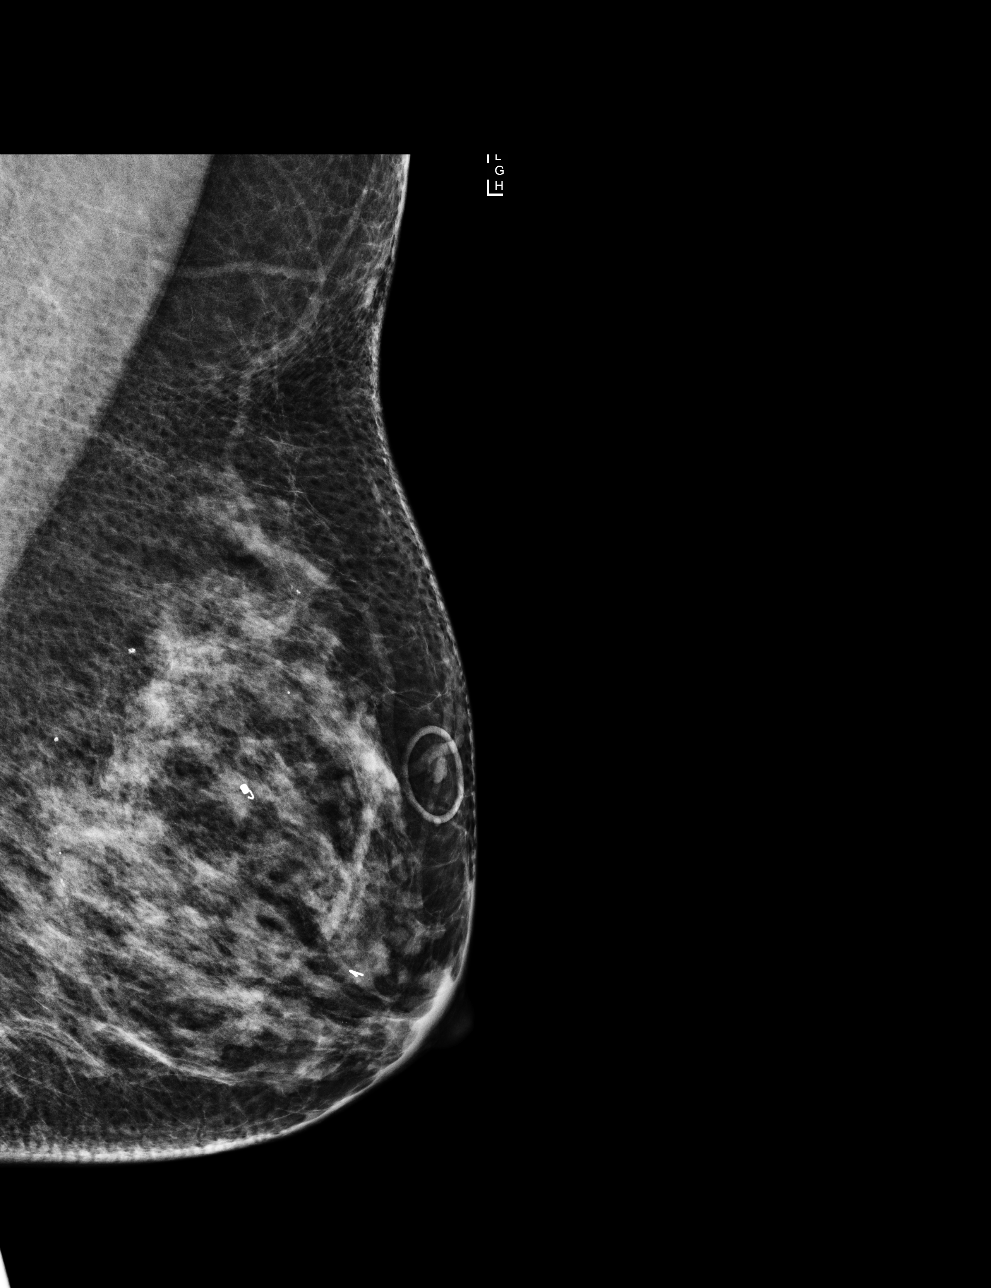

[R CC]
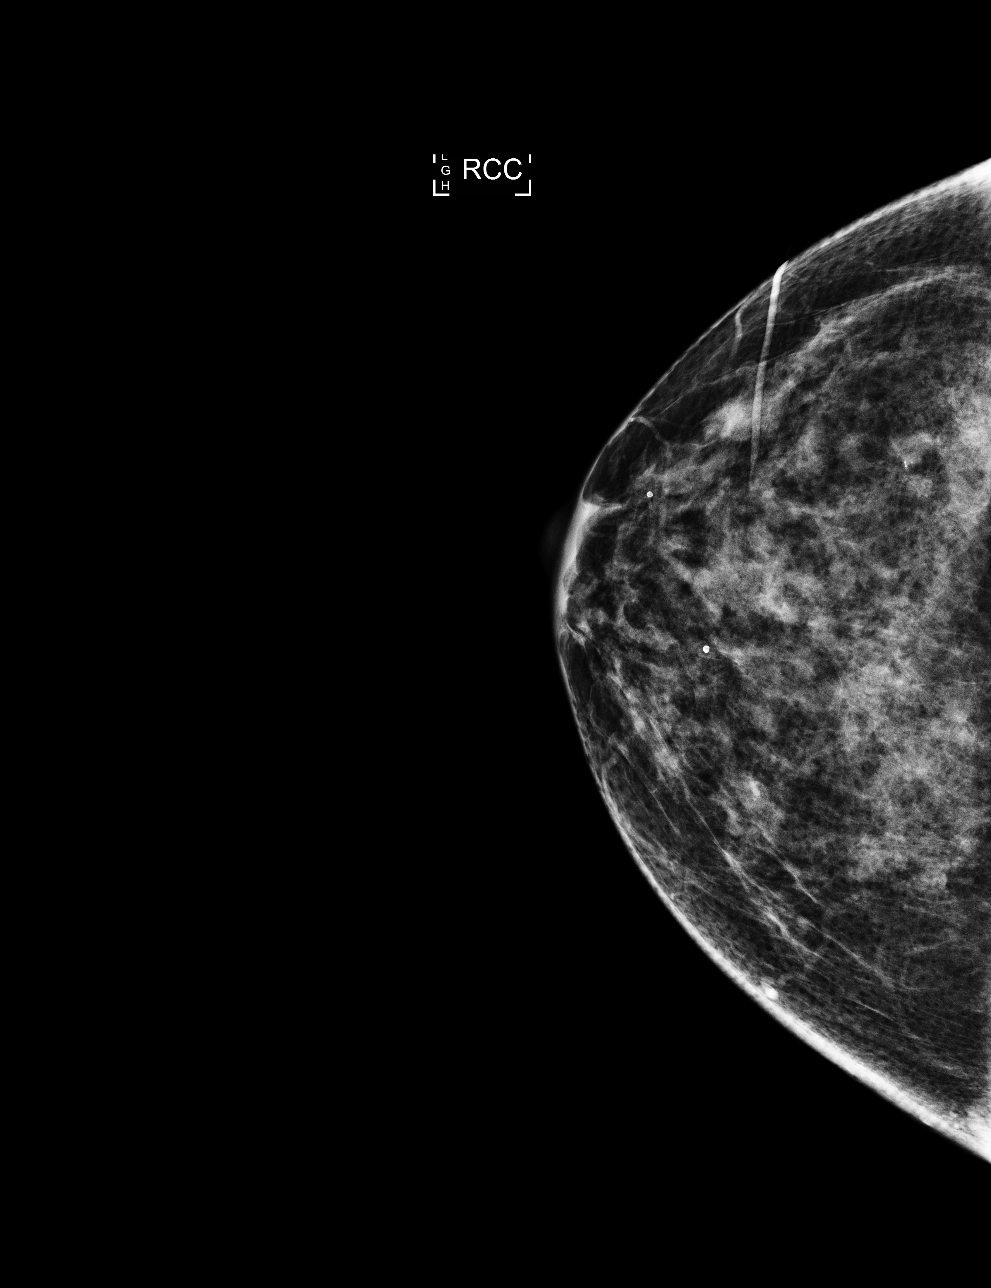

[R MLO]
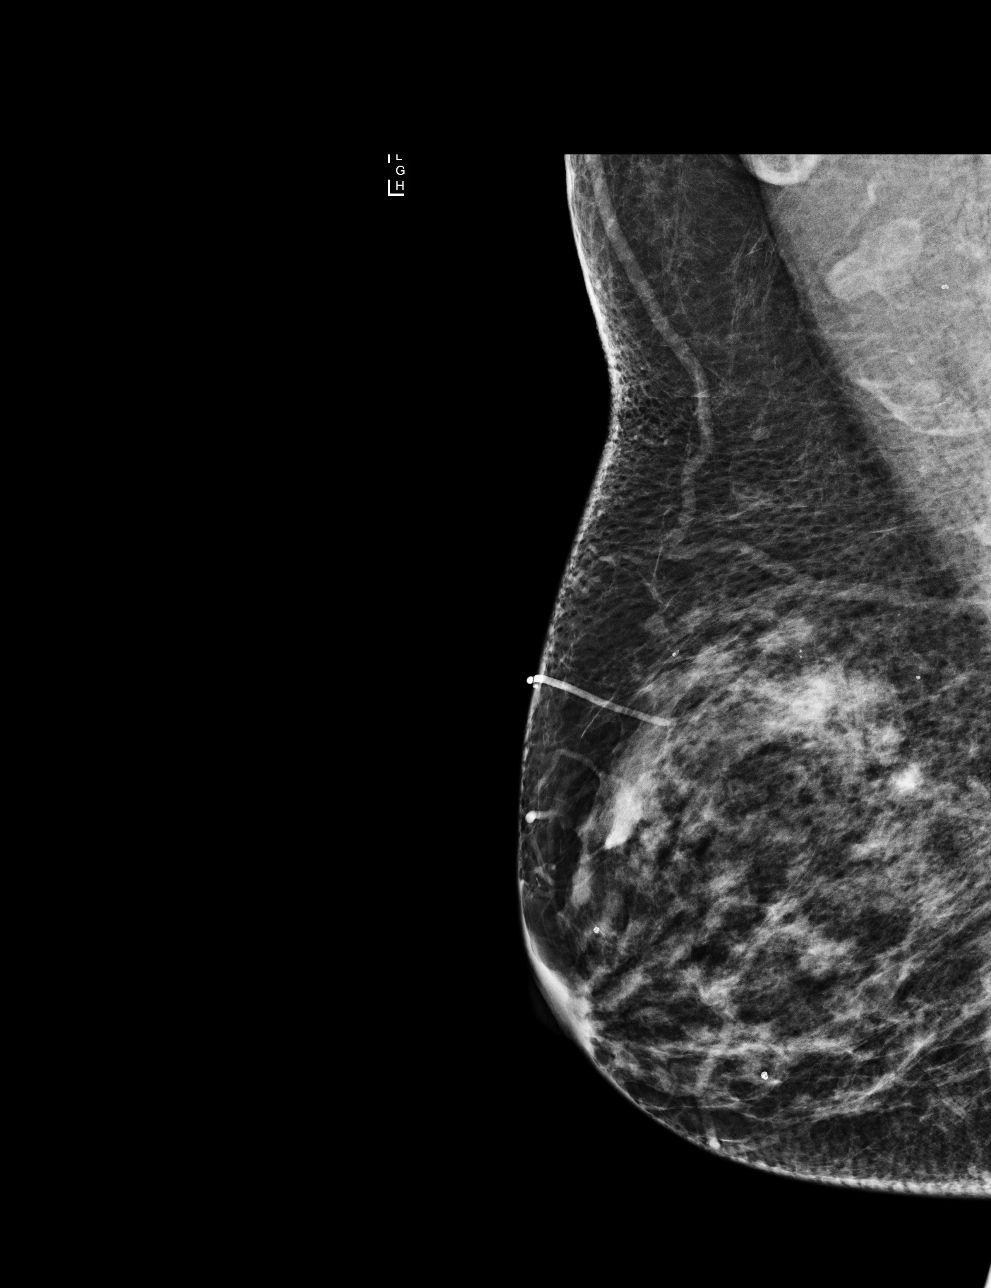

[4 of 4 positions shown; findings below may reference images not displayed]

ACR Breast Density Category c: The breast tissue is heterogeneously
dense, which may obscure small masses.
FINDINGS: There are no findings suspicious for malignancy. Images were
processed with CAD.
IMPRESSION: No mammographic evidence of malignancy. A result letter of this
screening mammogram will be mailed directly to the patient.

RECOMMENDATION:
Screening mammogram in one year. (Code:[id number])

BI-RADS CATEGORY  1: Negative.

## 2018-08-15 ENCOUNTER — Other Ambulatory Visit: Payer: Self-pay | Admitting: Family Medicine

## 2018-08-15 DIAGNOSIS — G5603 Carpal tunnel syndrome, bilateral upper limbs: Secondary | ICD-10-CM | POA: Diagnosis not present

## 2018-08-15 DIAGNOSIS — G2581 Restless legs syndrome: Secondary | ICD-10-CM | POA: Diagnosis not present

## 2018-08-15 DIAGNOSIS — G603 Idiopathic progressive neuropathy: Secondary | ICD-10-CM | POA: Diagnosis not present

## 2018-08-15 DIAGNOSIS — R201 Hypoesthesia of skin: Secondary | ICD-10-CM | POA: Diagnosis not present

## 2018-08-15 DIAGNOSIS — M545 Low back pain: Secondary | ICD-10-CM | POA: Diagnosis not present

## 2018-09-25 DIAGNOSIS — L579 Skin changes due to chronic exposure to nonionizing radiation, unspecified: Secondary | ICD-10-CM | POA: Diagnosis not present

## 2018-09-25 DIAGNOSIS — L719 Rosacea, unspecified: Secondary | ICD-10-CM | POA: Diagnosis not present

## 2018-09-25 DIAGNOSIS — L720 Epidermal cyst: Secondary | ICD-10-CM | POA: Diagnosis not present

## 2018-10-16 ENCOUNTER — Other Ambulatory Visit: Payer: Self-pay | Admitting: Family Medicine

## 2018-11-08 DIAGNOSIS — L720 Epidermal cyst: Secondary | ICD-10-CM | POA: Diagnosis not present

## 2018-11-23 DIAGNOSIS — Z79899 Other long term (current) drug therapy: Secondary | ICD-10-CM | POA: Diagnosis not present

## 2018-11-23 DIAGNOSIS — M542 Cervicalgia: Secondary | ICD-10-CM | POA: Diagnosis not present

## 2018-11-23 DIAGNOSIS — M545 Low back pain: Secondary | ICD-10-CM | POA: Diagnosis not present

## 2018-11-23 DIAGNOSIS — G603 Idiopathic progressive neuropathy: Secondary | ICD-10-CM | POA: Diagnosis not present

## 2018-11-23 DIAGNOSIS — G5603 Carpal tunnel syndrome, bilateral upper limbs: Secondary | ICD-10-CM | POA: Diagnosis not present

## 2018-11-23 DIAGNOSIS — G2581 Restless legs syndrome: Secondary | ICD-10-CM | POA: Diagnosis not present

## 2018-12-05 ENCOUNTER — Other Ambulatory Visit: Payer: Self-pay | Admitting: Family Medicine

## 2018-12-11 DIAGNOSIS — L719 Rosacea, unspecified: Secondary | ICD-10-CM | POA: Diagnosis not present

## 2018-12-11 DIAGNOSIS — L814 Other melanin hyperpigmentation: Secondary | ICD-10-CM | POA: Diagnosis not present

## 2018-12-11 DIAGNOSIS — L820 Inflamed seborrheic keratosis: Secondary | ICD-10-CM | POA: Diagnosis not present

## 2018-12-11 DIAGNOSIS — D1801 Hemangioma of skin and subcutaneous tissue: Secondary | ICD-10-CM | POA: Diagnosis not present

## 2018-12-12 ENCOUNTER — Other Ambulatory Visit: Payer: Self-pay | Admitting: Family Medicine

## 2018-12-12 ENCOUNTER — Ambulatory Visit (INDEPENDENT_AMBULATORY_CARE_PROVIDER_SITE_OTHER): Payer: Medicare Other | Admitting: Family Medicine

## 2018-12-12 ENCOUNTER — Encounter: Payer: Self-pay | Admitting: Family Medicine

## 2018-12-12 ENCOUNTER — Other Ambulatory Visit: Payer: Self-pay

## 2018-12-12 VITALS — BP 138/73 | HR 61 | Temp 98.1°F | Wt 148.0 lb

## 2018-12-12 DIAGNOSIS — Z1231 Encounter for screening mammogram for malignant neoplasm of breast: Secondary | ICD-10-CM

## 2018-12-12 DIAGNOSIS — R5383 Other fatigue: Secondary | ICD-10-CM | POA: Diagnosis not present

## 2018-12-12 DIAGNOSIS — E782 Mixed hyperlipidemia: Secondary | ICD-10-CM

## 2018-12-12 DIAGNOSIS — R232 Flushing: Secondary | ICD-10-CM

## 2018-12-12 DIAGNOSIS — R7303 Prediabetes: Secondary | ICD-10-CM

## 2018-12-12 DIAGNOSIS — I10 Essential (primary) hypertension: Secondary | ICD-10-CM | POA: Diagnosis not present

## 2018-12-12 LAB — POCT GLYCOSYLATED HEMOGLOBIN (HGB A1C): Hemoglobin A1C: 5.6 % (ref 4.0–5.6)

## 2018-12-12 NOTE — Progress Notes (Signed)
Cynthia Adams is a 73 y.o. female who presents to [place]: [place] today for hot flashes, hypertension.  [name] has some hot flashes recently.  She continues to take her hormone replacement therapy but notes a little bit of hot flashes over the last few months.  She is had a rash and been seen by dermatologist and thought to have rosacea.  This was treated as below but her dermatologist recommends thyroid testing.  She denies any history of hypothyroidism or hyperthyroidism in the past.  She notes she has had a little bit of weight loss as well but otherwise feels well with no fevers chills nausea vomiting or diarrhea.  She has been seen by her dermatologist at [facility] dermatology for rosacea.  She has had a variety of different creams which seem to be helping.  She is pretty happy that things are going.  She was prescribed an oral antibiotic that she is not sure the name of which caused some diarrhea and a yeast infection.  She was prescribed a oral antifungal pill which seems to be helping her yeast infection symptoms.  Hypertension and hyperlipidemia managed with medications listed below.  Doing well with no chest pain palpitation shortness of breath. ROS as above:  Exam:  BP 138/73   Pulse 61   Temp 98.1 F (36.7 C) (Oral)   Wt 148 lb (67.1 kg)   BMI 28.90 kg/m  138/73 Wt Readings from Last 5 Encounters:  12/12/18 148 lb (67.1 kg)  07/13/18 155 lb (70.3 kg)  06/13/18 156 lb (70.8 kg)  06/13/18 156 lb (70.8 kg)  04/21/18 154 lb 9.6 oz (70.1 kg)    Gen: Well NAD HEENT: EOMI,  MMM no goiter Lungs: Normal work of breathing. CTABL Heart: RRR no MRG Abd: NABS, Soft. Nondistended, Nontender Exts: Brisk capillary refill, warm and well perfused.  Skin: No severe rash.  Lab and Radiology Results Lab Results  Component Value Date   HGBA1C 5.6 12/12/2018      Assessment and Plan: 73 y.o. female with night sweats and a little bit of weight loss.  Obviously somewhat concerning.  Plan for limited metabolic work-up today with labs listed below.  Will check thyroid panel.  If unremarkable will proceed with limited imaging to screen for malignancy.  However physical exam and overall demeanor and appearance is reassuring and benign today.  Watchful waiting.  Hypertension and hyperlipidemia: Typically pretty well managed.  Plan to continue current regimen.  PDMP not reviewed this encounter. Orders Placed This Encounter  Procedures  . CBC with Differential/Platelet  . COMPLETE METABOLIC PANEL WITH GFR  . TSH  . T3, free  . T4, free  . POCT HgB A1C   No orders of the defined types were placed in this encounter.    Historical information moved to improve visibility of documentation.  Past Medical History:  Diagnosis Date  . Cataract 2017   Cataract surgery in 01-2016 and 02-2016  . GERD (gastroesophageal reflux disease)   . History of iron deficiency 06/13/2014  . Hyperlipidemia   . Hypertension   . Nerve pain   . Seasonal allergies    Past Surgical History:  Procedure Laterality Date  . ABDOMINAL HYSTERECTOMY    . BREAST BIOPSY    . BREAST CYST EXCISION    . CATARACT EXTRACTION W/ INTRAOCULAR LENS  IMPLANT, BILATERAL Bilateral 01-2016, 02-2016  . CATARACT EXTRACTION W/ INTRAOCULAR LENS & ANTERIOR VITRECTOMY Bilateral 01-29-2016,  02-19-2016   Right eye in 01-2016 and Left eye 02-2016  . CESAREAN SECTION    . CESAREAN SECTION    . CHOLECYSTECTOMY    . LUMBAR DISC SURGERY    . TONSILLECTOMY     Social History   Tobacco Use  . Smoking status: Never Smoker  . Smokeless tobacco: Never Used  Substance Use Topics  . Alcohol use: Yes    Comment: may have a glass of wine but very, very seldom.   family history includes Alcohol abuse in her paternal uncle and paternal uncle; Breast cancer in her maternal aunt, maternal aunt, maternal aunt,  and mother; Cancer in her mother and paternal uncle; Dementia in her maternal aunt, maternal aunt, maternal aunt, mother, and sister; Heart disease in her paternal grandmother; Hyperlipidemia in her brother, mother, sister, sister, and sister; Hypertension in her brother, mother, sister, sister, sister, and son; Stroke in her maternal grandmother.  Medications: Current Outpatient Medications  Medication Sig Dispense Refill  . aspirin 81 MG tablet Take 81 mg by mouth daily.    Marland Kitchen azelastine (ASTELIN) 0.1 % nasal spray Place 2 sprays into both nostrils 2 (two) times daily. Use in each nostril as directed 30 mL 12  . Cyanocobalamin (B-12 PO) Take by mouth.    . estradiol (ESTRACE) 0.5 MG tablet TAKE ONE-HALF TABLET BY  MOUTH DAILY 45 tablet 3  . fluocinolone (VANOS) 0.01 % cream Apply topically 2 (two) times daily. To affected area(s) as needed for mild facial rash 15 g 0  . fluticasone (FLONASE) 50 MCG/ACT nasal spray USE 2 SPRAYS IN EACH  NOSTRIL DAILY 48 g 3  . gabapentin (NEURONTIN) 600 MG tablet 1 po am, 1 po lunch and 2 po qhs 450 tablet 3  . gemfibrozil (LOPID) 600 MG tablet TAKE 1 TABLET BY MOUTH 2  TIMES DAILY BEFORE A MEAL. 180 tablet 3  . HYDROcodone-acetaminophen (NORCO) 10-325 MG tablet Take 1 tablet by mouth 3 (three) times daily.    Marland Kitchen loratadine (CLARITIN) 10 MG tablet Take 10 mg by mouth daily.    . metoprolol tartrate (LOPRESSOR) 50 MG tablet Take 1 tablet (50 mg total) by mouth 2 (two) times daily. 180 tablet 3  . MULTIPLE VITAMIN PO Take by mouth.    . Multiple Vitamins-Minerals (MULTIVITAMIN WITH MINERALS) tablet Take 1 tablet by mouth daily.    . Psyllium (FIBER) 0.52 g CAPS Take by mouth.    . Wheat Dextrin (BENEFIBER DRINK MIX PO) Take by mouth.     No current facility-administered medications for this visit.    Allergies  Allergen Reactions  . Minocycline Diarrhea, Nausea And Vomiting and Other (See Comments)    Stomach pain    . Sulfa Antibiotics Swelling  .  Amoxicillin-Pot Clavulanate Diarrhea  . Lipitor [Atorvastatin] Other (See Comments)    Diarrhea and insomnia  . Pravastatin Diarrhea     Discussed warning signs or symptoms. Please see discharge instructions. Patient expresses understanding.

## 2018-12-12 NOTE — Patient Instructions (Addendum)
Thank you for coming in today. Get labs now.  I will continue to follow.  If all well recheck in [place] for well adult visit.  Schedule high dose flu vaccine this fall for nurse visit in October.

## 2018-12-13 LAB — CBC WITH DIFFERENTIAL/PLATELET
Absolute Monocytes: 837 cells/uL (ref 200–950)
Basophils Absolute: 73 cells/uL (ref 0–200)
Basophils Relative: 0.8 %
Eosinophils Absolute: 209 cells/uL (ref 15–500)
Eosinophils Relative: 2.3 %
HCT: 34.5 % — ABNORMAL LOW (ref 35.0–45.0)
Hemoglobin: 11.9 g/dL (ref 11.7–15.5)
Lymphs Abs: 2503 cells/uL (ref 850–3900)
MCH: 31.8 pg (ref 27.0–33.0)
MCHC: 34.5 g/dL (ref 32.0–36.0)
MCV: 92.2 fL (ref 80.0–100.0)
MPV: 12.8 fL — ABNORMAL HIGH (ref 7.5–12.5)
Monocytes Relative: 9.2 %
Neutro Abs: 5478 cells/uL (ref 1500–7800)
Neutrophils Relative %: 60.2 %
Platelets: 320 10*3/uL (ref 140–400)
RBC: 3.74 10*6/uL — ABNORMAL LOW (ref 3.80–5.10)
RDW: 12.4 % (ref 11.0–15.0)
Total Lymphocyte: 27.5 %
WBC: 9.1 10*3/uL (ref 3.8–10.8)

## 2018-12-13 LAB — COMPLETE METABOLIC PANEL WITH GFR
AG Ratio: 1.7 (calc) (ref 1.0–2.5)
ALT: 17 U/L (ref 6–29)
AST: 21 U/L (ref 10–35)
Albumin: 4.6 g/dL (ref 3.6–5.1)
Alkaline phosphatase (APISO): 98 U/L (ref 37–153)
BUN/Creatinine Ratio: 21 (calc) (ref 6–22)
BUN: 24 mg/dL (ref 7–25)
CO2: 28 mmol/L (ref 20–32)
Calcium: 9.9 mg/dL (ref 8.6–10.4)
Chloride: 101 mmol/L (ref 98–110)
Creat: 1.15 mg/dL — ABNORMAL HIGH (ref 0.60–0.93)
GFR, Est African American: 55 mL/min/{1.73_m2} — ABNORMAL LOW (ref >=60)
GFR, Est Non African American: 47 mL/min/{1.73_m2} — ABNORMAL LOW (ref >=60)
Globulin: 2.7 g/dL (calc) (ref 1.9–3.7)
Glucose, Bld: 93 mg/dL (ref 65–99)
Potassium: 4.7 mmol/L (ref 3.5–5.3)
Sodium: 139 mmol/L (ref 135–146)
Total Bilirubin: 0.4 mg/dL (ref 0.2–1.2)
Total Protein: 7.3 g/dL (ref 6.1–8.1)

## 2018-12-13 LAB — T3, FREE: T3, Free: 1.6 pg/mL — ABNORMAL LOW (ref 2.3–4.2)

## 2018-12-13 LAB — TSH: TSH: 2.52 mIU/L (ref 0.40–4.50)

## 2018-12-13 LAB — T4, FREE: Free T4: 0.8 ng/dL (ref 0.8–1.8)

## 2018-12-26 ENCOUNTER — Other Ambulatory Visit: Payer: Self-pay

## 2018-12-26 ENCOUNTER — Encounter: Payer: Self-pay | Admitting: Family Medicine

## 2018-12-26 ENCOUNTER — Ambulatory Visit (INDEPENDENT_AMBULATORY_CARE_PROVIDER_SITE_OTHER): Payer: Medicare Other | Admitting: Family Medicine

## 2018-12-26 VITALS — BP 140/56 | HR 58 | Temp 98.2°F | Wt 149.5 lb

## 2018-12-26 DIAGNOSIS — Z23 Encounter for immunization: Secondary | ICD-10-CM

## 2018-12-26 DIAGNOSIS — Z1211 Encounter for screening for malignant neoplasm of colon: Secondary | ICD-10-CM | POA: Diagnosis not present

## 2018-12-26 DIAGNOSIS — D649 Anemia, unspecified: Secondary | ICD-10-CM | POA: Diagnosis not present

## 2018-12-26 DIAGNOSIS — R399 Unspecified symptoms and signs involving the genitourinary system: Secondary | ICD-10-CM

## 2018-12-26 DIAGNOSIS — Z8639 Personal history of other endocrine, nutritional and metabolic disease: Secondary | ICD-10-CM | POA: Diagnosis not present

## 2018-12-26 DIAGNOSIS — R7989 Other specified abnormal findings of blood chemistry: Secondary | ICD-10-CM

## 2018-12-26 LAB — POCT URINALYSIS DIPSTICK
Glucose, UA: NEGATIVE
Nitrite, UA: NEGATIVE
Protein, UA: POSITIVE — AB
Spec Grav, UA: >=1.03 — AB (ref 1.010–1.025)
Urobilinogen, UA: 0.2 E.U./dL
pH, UA: 5.5 (ref 5.0–8.0)

## 2018-12-26 MED ORDER — FLUCONAZOLE 150 MG PO TABS: ORAL_TABLET | ORAL | 1 refills | Status: DC

## 2018-12-26 MED ORDER — CEFDINIR 300 MG PO CAPS: 300.0000 mg | ORAL_CAPSULE | Freq: Two times a day (BID) | ORAL | 0 refills | Status: DC

## 2018-12-26 NOTE — Progress Notes (Signed)
Cynthia Adams is a 73 y.o. female who presents to [place]: [place] today for possible UTI and follow-up hot flashes and weight loss.  Dysuria: Patient was given antibiotic for her rosacea a few weeks ago.  She developed some vaginal itching and was treated with oral antifungals for possible yeast infection.  She has had continuous dysuria since then.  She notes frequency urgency and burning with urination.  She is not sure of the original antibiotic that she was given. (Per medication review patient was prescribed minocycline in May, and fluconazole in August).  Pt had resolution of itching with fluconazole.   Additionally patient was seen a few weeks ago.  She had been experiencing hot flashes and a bit of weight loss that was somewhat unexplained.  She had a work-up which showed normal TSH but slightly low free T4 and slightly low free T3.  Additionally hematocrit was slightly low.  She did not have any history of thyroid abnormalities.  She has had some low hemoglobin in the past.  She had most recent colonoscopy on Oct 03, 2014 which showed diverticulosis but no other abnormalities.   She notes that she has a follow-up appointment with her gastroenterologist  Dr Cindee Salt next month for a 2 year checkup.   ROS as above:  Exam:  BP (!) 140/56 (BP Location: Left Arm, Patient Position: Sitting, Cuff Size: Normal)   Pulse (!) 58   Temp 98.2 F (36.8 C) (Oral)   Wt 149 lb 8 oz (67.8 kg)   BMI 29.20 kg/m  Wt Readings from Last 5 Encounters:  12/26/18 149 lb 8 oz (67.8 kg)  12/12/18 148 lb (67.1 kg)  07/13/18 155 lb (70.3 kg)  06/13/18 156 lb (70.8 kg)  06/13/18 156 lb (70.8 kg)   BP Readings from Last 3 Encounters:  12/26/18 (!) 140/56  12/12/18 138/73  07/13/18 (!) 148/51     Gen: Well NAD HEENT: EOMI,  MMM Lungs: Normal work of breathing. CTABL Heart: RRR no MRG Abd:  NABS, Soft. Nondistended, Nontender no CVA no tenderness to percussion Exts: Brisk capillary refill, warm and well perfused.   Lab and Radiology Results  Recent Results (from the past 2160 hour(s))  CBC with Differential/Platelet     Status: Abnormal   Collection Time: 12/12/18  1:43 PM  Result Value Ref Range   WBC 9.1 3.8 - 10.8 Thousand/uL   RBC 3.74 (L) 3.80 - 5.10 Million/uL   Hemoglobin 11.9 11.7 - 15.5 g/dL   HCT 34.5 (L) 35.0 - 45.0 %   MCV 92.2 80.0 - 100.0 fL   MCH 31.8 27.0 - 33.0 pg   MCHC 34.5 32.0 - 36.0 g/dL   RDW 12.4 11.0 - 15.0 %   Platelets 320 140 - 400 Thousand/uL   MPV 12.8 (H) 7.5 - 12.5 fL   Neutro Abs 5,478 1,500 - 7,800 cells/uL   Lymphs Abs 2,503 850 - 3,900 cells/uL   Absolute Monocytes 837 200 - 950 cells/uL   Eosinophils Absolute 209 15 - 500 cells/uL   Basophils Absolute 73 0 - 200 cells/uL   Neutrophils Relative % 60.2 %   Total Lymphocyte 27.5 %   Monocytes Relative 9.2 %   Eosinophils Relative 2.3 %   Basophils Relative 0.8 %  COMPLETE METABOLIC PANEL WITH GFR     Status: Abnormal   Collection Time: 12/12/18  1:43 PM  Result Value Ref Range   Glucose, Bld 93  65 - 99 mg/dL    Comment: .            Fasting reference interval .    BUN 24 7 - 25 mg/dL   Creat 1.15 (H) 0.60 - 0.93 mg/dL    Comment: For patients >66 years of age, the reference limit for Creatinine is approximately 13% higher for people identified as African-American. .    GFR, Est Non African American 47 (L) > OR = 60 mL/min/1.37m2   GFR, Est African American 55 (L) > OR = 60 mL/min/1.7m2   BUN/Creatinine Ratio 21 6 - 22 (calc)   Sodium 139 135 - 146 mmol/L   Potassium 4.7 3.5 - 5.3 mmol/L   Chloride 101 98 - 110 mmol/L   CO2 28 20 - 32 mmol/L   Calcium 9.9 8.6 - 10.4 mg/dL   Total Protein 7.3 6.1 - 8.1 g/dL   Albumin 4.6 3.6 - 5.1 g/dL   Globulin 2.7 1.9 - 3.7 g/dL (calc)   AG Ratio 1.7 1.0 - 2.5 (calc)   Total Bilirubin 0.4 0.2 - 1.2 mg/dL   Alkaline phosphatase  (APISO) 98 37 - 153 U/L   AST 21 10 - 35 U/L   ALT 17 6 - 29 U/L  TSH     Status: None   Collection Time: 12/12/18  1:43 PM  Result Value Ref Range   TSH 2.52 0.40 - 4.50 mIU/L  T3, free     Status: Abnormal   Collection Time: 12/12/18  1:43 PM  Result Value Ref Range   T3, Free 1.6 (L) 2.3 - 4.2 pg/mL  T4, free     Status: None   Collection Time: 12/12/18  1:43 PM  Result Value Ref Range   Free T4 0.8 0.8 - 1.8 ng/dL  POCT HgB A1C     Status: Normal   Collection Time: 12/12/18  4:34 PM  Result Value Ref Range   Hemoglobin A1C 5.6 4.0 - 5.6 %   HbA1c POC (<> result, manual entry)     HbA1c, POC (prediabetic range)     HbA1c, POC (controlled diabetic range)    POCT Urinalysis Dipstick     Status: Abnormal   Collection Time: 12/26/18  9:36 AM  Result Value Ref Range   Color, UA Yellow    Clarity, UA Cloudy    Glucose, UA Negative Negative   Bilirubin, UA Small    Ketones, UA Trace    Spec Grav, UA >=1.030 (A) 1.010 - 1.025   Blood, UA Trace-intact    pH, UA 5.5 5.0 - 8.0   Protein, UA Positive (A) Negative    Comment: 100 mg/dl   Urobilinogen, UA 0.2 0.2 or 1.0 E.U./dL   Nitrite, UA Negative    Leukocytes, UA Small (1+) (A) Negative   Appearance     Odor       Assessment and Plan: 73 y.o. female with  UTI.  Symptoms likely due to UTI.  Plan for urine culture and urine micro evaluation.  Empiric treatment with Omnicef.  Backup fluconazole for use if patient does develop yeast infection which has happened in the past.  Additionally patient has the weight loss and slight lab abnormalities as described previously.  Significantly mildly low hemoglobin and mildly abnormal thyroid studies.  We will follow-up these issues with repeat CBC metabolic panel.  We will also check iron stores and thyroid-stimulating immunoglobulin and thyroid peroxidase antibodies to follow-up thyroid abnormalities.  Additionally since her mild anemia is present  a little worried about potential source  of blood loss from GI source.  Will send patient home with stool cards to return.  This should hopefully evaluate for microscopic rectal bleeding.  Patient already has a follow-up appointment established with gastroenterology.   Additionally informed patient that I am transitioning to sports medicine only in [place] in November.  Obviously she will need to establish care with new PCP in my practice.  Provided options for that.  We will schedule follow-up appointment with me in mid October to follow-up the above issues and make sure all the threads are wrapped up. PDMP not reviewed this encounter. Orders Placed This Encounter  Procedures  . Urine Culture  . Iron, TIBC and Ferritin Panel  . Thyroid stimulating immunoglobulin  . Thyroid Peroxidase Antibodies (TPO) (REFL)  . CBC  . COMPLETE METABOLIC PANEL WITH GFR  . Urinalysis, microscopic only  . POCT Urinalysis Dipstick   Meds ordered this encounter  Medications  . cefdinir (OMNICEF) 300 MG capsule    Sig: Take 1 capsule (300 mg total) by mouth 2 (two) times daily.    Dispense:  14 capsule    Refill:  0  . fluconazole (DIFLUCAN) 150 MG tablet    Sig: Take 1 pill po for yeast infection. Repeat in 3 days if symptoms still present.    Dispense:  2 tablet    Refill:  1     Historical information moved to improve visibility of documentation.  Past Medical History:  Diagnosis Date  . Cataract 2017   Cataract surgery in 01-2016 and 02-2016  . GERD (gastroesophageal reflux disease)   . History of iron deficiency 06/13/2014  . Hyperlipidemia   . Hypertension   . Nerve pain   . Seasonal allergies    Past Surgical History:  Procedure Laterality Date  . ABDOMINAL HYSTERECTOMY    . BREAST BIOPSY    . BREAST CYST EXCISION    . CATARACT EXTRACTION W/ INTRAOCULAR LENS  IMPLANT, BILATERAL Bilateral 01-2016, 02-2016  . CATARACT EXTRACTION W/ INTRAOCULAR LENS & ANTERIOR VITRECTOMY Bilateral 01-29-2016, 02-19-2016   Right eye in 01-2016  and Left eye 02-2016  . CESAREAN SECTION    . CESAREAN SECTION    . CHOLECYSTECTOMY    . LUMBAR DISC SURGERY    . TONSILLECTOMY     Social History   Tobacco Use  . Smoking status: Never Smoker  . Smokeless tobacco: Never Used  Substance Use Topics  . Alcohol use: Yes    Comment: may have a glass of wine but very, very seldom.   family history includes Alcohol abuse in her paternal uncle and paternal uncle; Breast cancer in her maternal aunt, maternal aunt, maternal aunt, and mother; Cancer in her mother and paternal uncle; Dementia in her maternal aunt, maternal aunt, maternal aunt, mother, and sister; Heart disease in her paternal grandmother; Hyperlipidemia in her brother, mother, sister, sister, and sister; Hypertension in her brother, mother, sister, sister, sister, and son; Stroke in her maternal grandmother.  Medications: Current Outpatient Medications  Medication Sig Dispense Refill  . aspirin 81 MG tablet Take 81 mg by mouth daily.    . Cyanocobalamin (B-12 PO) Take by mouth.    . estradiol (ESTRACE) 0.5 MG tablet TAKE ONE-HALF TABLET BY  MOUTH DAILY 45 tablet 3  . fluocinolone (VANOS) 0.01 % cream Apply topically 2 (two) times daily. To affected area(s) as needed for mild facial rash 15 g 0  . fluticasone (FLONASE) 50 MCG/ACT nasal spray USE  2 SPRAYS IN EACH  NOSTRIL DAILY 48 g 3  . gabapentin (NEURONTIN) 600 MG tablet 1 po am, 1 po lunch and 2 po qhs 450 tablet 3  . gemfibrozil (LOPID) 600 MG tablet TAKE 1 TABLET BY MOUTH 2  TIMES DAILY BEFORE A MEAL. 180 tablet 3  . HYDROcodone-acetaminophen (NORCO) 10-325 MG tablet Take 1 tablet by mouth 3 (three) times daily.    Marland Kitchen loratadine (CLARITIN) 10 MG tablet Take 10 mg by mouth daily.    . metoprolol tartrate (LOPRESSOR) 50 MG tablet Take 1 tablet (50 mg total) by mouth 2 (two) times daily. 180 tablet 3  . MULTIPLE VITAMIN PO Take by mouth.    . Multiple Vitamins-Minerals (MULTIVITAMIN WITH MINERALS) tablet Take 1 tablet by mouth  daily.    . Psyllium (FIBER) 0.52 g CAPS Take by mouth.    . Wheat Dextrin (BENEFIBER DRINK MIX PO) Take by mouth.    Marland Kitchen azelastine (ASTELIN) 0.1 % nasal spray Place 2 sprays into both nostrils 2 (two) times daily. Use in each nostril as directed (Patient not taking: Reported on 12/26/2018) 30 mL 12  . cefdinir (OMNICEF) 300 MG capsule Take 1 capsule (300 mg total) by mouth 2 (two) times daily. 14 capsule 0  . fluconazole (DIFLUCAN) 150 MG tablet Take 1 pill po for yeast infection. Repeat in 3 days if symptoms still present. 2 tablet 1   No current facility-administered medications for this visit.    Allergies  Allergen Reactions  . Minocycline Diarrhea, Nausea And Vomiting and Other (See Comments)    Stomach pain    . Sulfa Antibiotics Swelling  . Amoxicillin-Pot Clavulanate Diarrhea  . Lipitor [Atorvastatin] Other (See Comments)    Diarrhea and insomnia  . Pravastatin Diarrhea     Discussed warning signs or symptoms. Please see discharge instructions. Patient expresses understanding.

## 2018-12-26 NOTE — Patient Instructions (Addendum)
Thank you for coming in today.  Get labs.  Return stool cards.    Urinary Tract Infection, Adult A urinary tract infection (UTI) is an infection of any part of the urinary tract. The urinary tract includes:  The kidneys.  The ureters.  The bladder.  The urethra. These organs make, store, and get rid of pee (urine) in the body. What are the causes? This is caused by germs (bacteria) in your genital area. These germs grow and cause swelling (inflammation) of your urinary tract. What increases the risk? You are more likely to develop this condition if:  You have a small, thin tube (catheter) to drain pee.  You cannot control when you pee or poop (incontinence).  You are female, and: ? You use these methods to prevent pregnancy: ? A medicine that kills sperm (spermicide). ? A device that blocks sperm (diaphragm). ? You have low levels of a female hormone (estrogen). ? You are pregnant.  You have genes that add to your risk.  You are sexually active.  You take antibiotic medicines.  You have trouble peeing because of: ? A prostate that is bigger than normal, if you are female. ? A blockage in the part of your body that drains pee from the bladder (urethra). ? A kidney stone. ? A nerve condition that affects your bladder (neurogenic bladder). ? Not getting enough to drink. ? Not peeing often enough.  You have other conditions, such as: ? Diabetes. ? A weak disease-fighting system (immune system). ? Sickle cell disease. ? Gout. ? Injury of the spine. What are the signs or symptoms? Symptoms of this condition include:  Needing to pee right away (urgently).  Peeing often.  Peeing small amounts often.  Pain or burning when peeing.  Blood in the pee.  Pee that smells bad or not like normal.  Trouble peeing.  Pee that is cloudy.  Fluid coming from the vagina, if you are female.  Pain in the belly or lower back. Other symptoms include:  Throwing up  (vomiting).  No urge to eat.  Feeling mixed up (confused).  Being tired and grouchy (irritable).  A fever.  Watery poop (diarrhea). How is this treated? This condition may be treated with:  Antibiotic medicine.  Other medicines.  Drinking enough water. Follow these instructions at home:  Medicines  Take over-the-counter and prescription medicines only as told by your doctor.  If you were prescribed an antibiotic medicine, take it as told by your doctor. Do not stop taking it even if you start to feel better. General instructions  Make sure you: ? Pee until your bladder is empty. ? Do not hold pee for a long time. ? Empty your bladder after sex. ? Wipe from front to back after pooping if you are a female. Use each tissue one time when you wipe.  Drink enough fluid to keep your pee pale yellow.  Keep all follow-up visits as told by your doctor. This is important. Contact a doctor if:  You do not get better after 1-2 days.  Your symptoms go away and then come back. Get help right away if:  You have very bad back pain.  You have very bad pain in your lower belly.  You have a fever.  You are sick to your stomach (nauseous).  You are throwing up. Summary  A urinary tract infection (UTI) is an infection of any part of the urinary tract.  This condition is caused by germs in your genital  area.  There are many risk factors for a UTI. These include having a small, thin tube to drain pee and not being able to control when you pee or poop.  Treatment includes antibiotic medicines for germs.  Drink enough fluid to keep your pee pale yellow. This information is not intended to replace advice given to you by your health care provider. Make sure you discuss any questions you have with your health care provider. Document Released: 10/13/2007 Document Revised: 04/13/2018 Document Reviewed: 11/03/2017 Elsevier Patient Education  [employer].   I will be moving  to full time Sports Medicine in [place] starting on November 1st.  You will still be able to see me for your Sports Medicine or Orthopedic needs in the [facility] Location. I will still be part of [place].    [place], [place] [address]  [phone]  Telephone (phone line will be functional starting in November).  [phone] Fax [phone] Concussion Line  If you want to stay locally for your Sports Medicine issues Dr. Dianah Field here in [place] will be happy to see you.  Additionally Dr. Clearance Coots at [facility] will be happy to see you for sports medicine issues more locally.   For your primary care needs you are welcome to establish care with Dr. Emeterio Reeve.  We are working quickly to hire more physicians to cover the primary care needs however if you cannot get an appointment with Dr. Sheppard Coil in a timely manner [facility] has locations and openings for primary care services nearby.   [facility] at [facility] [address] . [employer] , [place]: [phone] . Behavioral Medicine: [phone] . Fax: [place] at [employer] [address] . [place], [place]: [phone] . Behavioral Medicine: [phone] . Fax: [phone] . Hours (M-F): 7am - Academic librarian At [facility]. [place], [place]: [phone] . Behavioral Medicine: [phone] . Fax: [phone] . Hours (M-F): 8am - Optician, dispensing at [employer] . [place], [place] Phone: [phone] . Behavioral Medicine: [phone] . Fax: [phone]

## 2018-12-28 LAB — IRON,TIBC AND FERRITIN PANEL
%SAT: 22 % (calc) (ref 16–45)
Ferritin: 100 ng/mL (ref 16–288)
Iron: 97 ug/dL (ref 45–160)
TIBC: 447 mcg/dL (calc) (ref 250–450)

## 2018-12-28 LAB — COMPLETE METABOLIC PANEL WITH GFR
AG Ratio: 1.6 (calc) (ref 1.0–2.5)
ALT: 23 U/L (ref 6–29)
AST: 22 U/L (ref 10–35)
Albumin: 4.4 g/dL (ref 3.6–5.1)
Alkaline phosphatase (APISO): 83 U/L (ref 37–153)
BUN/Creatinine Ratio: 22 (calc) (ref 6–22)
BUN: 22 mg/dL (ref 7–25)
CO2: 26 mmol/L (ref 20–32)
Calcium: 9.8 mg/dL (ref 8.6–10.4)
Chloride: 104 mmol/L (ref 98–110)
Creat: 1.01 mg/dL — ABNORMAL HIGH (ref 0.60–0.93)
GFR, Est African American: 64 mL/min/{1.73_m2} (ref >=60)
GFR, Est Non African American: 56 mL/min/{1.73_m2} — ABNORMAL LOW (ref >=60)
Globulin: 2.7 g/dL (calc) (ref 1.9–3.7)
Glucose, Bld: 99 mg/dL (ref 65–99)
Potassium: 4.3 mmol/L (ref 3.5–5.3)
Sodium: 139 mmol/L (ref 135–146)
Total Bilirubin: 0.3 mg/dL (ref 0.2–1.2)
Total Protein: 7.1 g/dL (ref 6.1–8.1)

## 2018-12-28 LAB — THYROID STIMULATING IMMUNOGLOBULIN

## 2018-12-28 LAB — CBC
HCT: 34.2 % — ABNORMAL LOW (ref 35.0–45.0)
Hemoglobin: 11.7 g/dL (ref 11.7–15.5)
MCH: 32.1 pg (ref 27.0–33.0)
MCHC: 34.2 g/dL (ref 32.0–36.0)
MCV: 94 fL (ref 80.0–100.0)
MPV: 12.2 fL (ref 7.5–12.5)
Platelets: 326 10*3/uL (ref 140–400)
RBC: 3.64 10*6/uL — ABNORMAL LOW (ref 3.80–5.10)
RDW: 12.3 % (ref 11.0–15.0)
WBC: 7.1 10*3/uL (ref 3.8–10.8)

## 2018-12-28 LAB — THYROID PEROXIDASE ANTIBODIES (TPO) (REFL): Thyroperoxidase Ab SerPl-aCnc: <1 IU/mL (ref <9)

## 2018-12-29 LAB — URINALYSIS, MICROSCOPIC ONLY

## 2018-12-29 LAB — URINE CULTURE
MICRO NUMBER:: 783728
SPECIMEN QUALITY:: ADEQUATE

## 2018-12-29 LAB — CBC

## 2018-12-29 LAB — THYROID STIMULATING IMMUNOGLOBULIN: TSI: <89

## 2018-12-29 LAB — COMPLETE METABOLIC PANEL WITH GFR

## 2018-12-29 LAB — IRON,TIBC AND FERRITIN PANEL

## 2018-12-29 LAB — THYROID PEROXIDASE ANTIBODIES (TPO) (REFL)

## 2019-01-01 ENCOUNTER — Other Ambulatory Visit (INDEPENDENT_AMBULATORY_CARE_PROVIDER_SITE_OTHER): Payer: Medicare Other | Admitting: Family Medicine

## 2019-01-01 DIAGNOSIS — Z1211 Encounter for screening for malignant neoplasm of colon: Secondary | ICD-10-CM

## 2019-01-01 LAB — POC HEMOCCULT BLD/STL (HOME/3-CARD/SCREEN)
Card #2 Fecal Occult Blod, POC: NEGATIVE
Card #3 Fecal Occult Blood, POC: NEGATIVE
Fecal Occult Blood, POC: NEGATIVE

## 2019-01-01 NOTE — Progress Notes (Signed)
Patient dropped off stool cards in office and the results are listed below.

## 2019-01-02 NOTE — Addendum Note (Signed)
Addended by: Mertha Finders on: 01/02/2019 10:03 AM   Modules accepted: Orders

## 2019-01-03 ENCOUNTER — Other Ambulatory Visit: Payer: Self-pay | Admitting: Family Medicine

## 2019-01-10 DIAGNOSIS — K219 Gastro-esophageal reflux disease without esophagitis: Secondary | ICD-10-CM | POA: Diagnosis not present

## 2019-01-10 DIAGNOSIS — R1310 Dysphagia, unspecified: Secondary | ICD-10-CM | POA: Diagnosis not present

## 2019-01-10 DIAGNOSIS — K644 Residual hemorrhoidal skin tags: Secondary | ICD-10-CM | POA: Diagnosis not present

## 2019-02-07 DIAGNOSIS — R1310 Dysphagia, unspecified: Secondary | ICD-10-CM | POA: Diagnosis not present

## 2019-02-07 DIAGNOSIS — K222 Esophageal obstruction: Secondary | ICD-10-CM | POA: Diagnosis not present

## 2019-02-12 ENCOUNTER — Ambulatory Visit: Payer: Medicare Other

## 2019-02-20 ENCOUNTER — Telehealth: Payer: Self-pay | Admitting: Family Medicine

## 2019-02-20 NOTE — Telephone Encounter (Signed)
Received upper endoscopy report from digestive health specialist.  Patient had upper endoscopy on February 07, 2019.  Findings significant for stricture of the proximal esophagus and esophagitis and erosions in the stomach compatible with gastritis.  Patient had dilation and was started on Protonix. Note will be sent to scan.

## 2019-02-21 ENCOUNTER — Other Ambulatory Visit: Payer: Self-pay | Admitting: Family Medicine

## 2019-02-26 ENCOUNTER — Ambulatory Visit (INDEPENDENT_AMBULATORY_CARE_PROVIDER_SITE_OTHER): Payer: Medicare Other | Admitting: Family Medicine

## 2019-02-26 ENCOUNTER — Encounter: Payer: Self-pay | Admitting: Family Medicine

## 2019-02-26 ENCOUNTER — Other Ambulatory Visit: Payer: Self-pay

## 2019-02-26 VITALS — BP 150/79 | HR 55 | Temp 98.0°F | Ht 60.0 in | Wt 156.0 lb

## 2019-02-26 DIAGNOSIS — R5383 Other fatigue: Secondary | ICD-10-CM

## 2019-02-26 NOTE — Patient Instructions (Signed)
Thank you for coming in today. Continue current medicine.  Schedule with Dr Zigmund Daniel or Sheppard Coil for well adult physical in February.    I will be moving to full time Sports Medicine in [place] starting on November 2nd  You will still be able to see me for your Sports Medicine or Orthopedic needs at [facility] in [place]. I will still be part of [place].    If you want to stay locally for your Sports Medicine issues Dr. Dianah Field here in [place] will be happy to see you.  Additionally Dr. Clearance Coots at [facility] will be happy to see you for sports medicine issues more locally.   For your primary care needs you are welcome to establish care with Dr. Emeterio Reeve.  Dr Luetta Nutting (Starting in February) will be starting in the new year and a new NP Joy (Starting in December).  We are working quickly to hire more physicians to cover the primary care needs however if you cannot get an appointment with Dr. Sheppard Coil in a timely manner [facility] has locations and openings for primary care services nearby.   [facility] at [facility] [address] . [employer] , [place]: [phone] . Behavioral Medicine: [phone] . Fax: [place] [facility] [employer] [address] . [place], [place]: [phone] . Behavioral Medicine: [phone] . Fax: [phone] . Hours (M-F): 7am - Academic librarian At [facility]. [place], [place]: [phone] . Behavioral Medicine: [phone] . Fax: [phone] . Hours (M-F): 8am - Optician, dispensing at [employer] . [place], [place] Phone: [phone] . Behavioral Medicine: [phone] . Fax: [phone]

## 2019-02-26 NOTE — Progress Notes (Signed)
Cynthia Adams is a 73 y.o. female who presents to [place]: [facility] today for follow-up weight loss and fatigue  Patient has been experiencing hot flashes and weight loss.  She had work-up back in the summer showing normal TSH but slightly low free T4 and T3 and hematocrit was slightly low.  Subsequent stool cards were negative for Hemoccult blood.  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  She was started on Protonix.  She notes that she is feeling a lot better now.  She is regained her normal weight and is no longer fatigued.  She is eating normally and feels healthy.  She denies chest pain palpitation shortness of breath lightheadedness or dizziness.  ROS as above:  Exam:  BP (!) 150/79   Pulse (!) 55   Temp 98 F (36.7 C)   Ht 5' (1.524 m)   Wt 156 lb (70.8 kg)   SpO2 98%   BMI 30.47 kg/m  Wt Readings from Last 5 Encounters:  02/26/19 156 lb (70.8 kg)  12/26/18 149 lb 8 oz (67.8 kg)  12/12/18 148 lb (67.1 kg)  07/13/18 155 lb (70.3 kg)  06/13/18 156 lb (70.8 kg)    Gen: Well NAD HEENT: EOMI,  MMM Lungs: Normal work of breathing. CTABL Heart: RRR no MRG Abd: NABS, Soft. Nondistended, Nontender Exts: Brisk capillary refill, warm and well perfused.   Lab and Radiology Results No results found for this or any previous visit (from the past 72 hour(s)). No results found.  Recent Results (from the past 2160 hour(s))  CBC with Differential/Platelet     Status: Abnormal   Collection Time: 12/12/18  1:43 PM  Result Value Ref Range   WBC 9.1 3.8 - 10.8 Thousand/uL   RBC 3.74 (L) 3.80 - 5.10 Million/uL   Hemoglobin 11.9 11.7 - 15.5 g/dL   HCT 34.5 (L) 35.0 - 45.0  %   MCV 92.2 80.0 - 100.0 fL   MCH 31.8 27.0 - 33.0 pg   MCHC 34.5 32.0 - 36.0 g/dL   RDW 12.4 11.0 - 15.0 %   Platelets 320 140 - 400 Thousand/uL   MPV 12.8 (H) 7.5 - 12.5 fL   Neutro Abs 5,478 1,500 - 7,800 cells/uL   Lymphs Abs 2,503 850 - 3,900 cells/uL   Absolute Monocytes 837 200 - 950 cells/uL   Eosinophils Absolute 209 15 - 500 cells/uL   Basophils Absolute 73 0 - 200 cells/uL   Neutrophils Relative % 60.2 %   Total Lymphocyte 27.5 %   Monocytes Relative 9.2 %   Eosinophils Relative 2.3 %   Basophils Relative 0.8 %  COMPLETE METABOLIC PANEL WITH GFR     Status: Abnormal   Collection Time: 12/12/18  1:43 PM  Result Value Ref Range   Glucose, Bld 93 65 - 99 mg/dL    Comment: .            Fasting reference interval .    BUN 24 7 - 25 mg/dL   Creat 1.15 (H) 0.60 - 0.93 mg/dL    Comment: For patients >90 years of age, the reference limit for Creatinine is approximately 13% higher for people identified as African-American. .    GFR, Est Non African American 47 (L) > OR =  60 mL/min/1.87m2   GFR, Est African American 55 (L) > OR = 60 mL/min/1.23m2   BUN/Creatinine Ratio 21 6 - 22 (calc)   Sodium 139 135 - 146 mmol/L   Potassium 4.7 3.5 - 5.3 mmol/L   Chloride 101 98 - 110 mmol/L   CO2 28 20 - 32 mmol/L   Calcium 9.9 8.6 - 10.4 mg/dL   Total Protein 7.3 6.1 - 8.1 g/dL   Albumin 4.6 3.6 - 5.1 g/dL   Globulin 2.7 1.9 - 3.7 g/dL (calc)   AG Ratio 1.7 1.0 - 2.5 (calc)   Total Bilirubin 0.4 0.2 - 1.2 mg/dL   Alkaline phosphatase (APISO) 98 37 - 153 U/L   AST 21 10 - 35 U/L   ALT 17 6 - 29 U/L  TSH     Status: None   Collection Time: 12/12/18  1:43 PM  Result Value Ref Range   TSH 2.52 0.40 - 4.50 mIU/L  T3, free     Status: Abnormal   Collection Time: 12/12/18  1:43 PM  Result Value Ref Range   T3, Free 1.6 (L) 2.3 - 4.2 pg/mL  T4, free     Status: None   Collection Time: 12/12/18  1:43 PM  Result Value Ref Range   Free T4 0.8 0.8 - 1.8 ng/dL  POCT HgB A1C      Status: Normal   Collection Time: 12/12/18  4:34 PM  Result Value Ref Range   Hemoglobin A1C 5.6 4.0 - 5.6 %   HbA1c POC (<> result, manual entry)     HbA1c, POC (prediabetic range)     HbA1c, POC (controlled diabetic range)    Urinalysis, microscopic only     Status: Abnormal   Collection Time: 12/26/18  9:27 AM  Result Value Ref Range   WBC, UA 10-20 (A) 0 - 5 /HPF   RBC / HPF 0-2 0 - 2 /HPF   Squamous Epithelial / LPF 6-10 (A) < OR = 5 /HPF   TRANSITIONAL EPITHELIAL CELLS 0-5 < OR = 5 /HPF   Bacteria, UA MODERATE (A) NONE SEEN /HPF   Hyaline Cast 1-3 (A) NONE SEEN /LPF   Urine-Other FEW MUCOUS THREADS    Note      Comment: This urine was analyzed for the presence of WBC,  RBC, bacteria, casts, and other formed elements.  Only those elements seen were reported. . .   Urine Culture     Status: Abnormal   Collection Time: 12/26/18  9:27 AM   Specimen: Urine  Result Value Ref Range   MICRO NUMBER: [id number]    SPECIMEN QUALITY: Adequate    Sample Source NOT GIVEN    STATUS: FINAL    ISOLATE 1: Escherichia coli (A)     Comment: 10,000-50,000 CFU/mL of Escherichia coli      Susceptibility   Escherichia coli - URINE CULTURE, REFLEX    AMOX/CLAVULANIC >=32 Resistant     AMPICILLIN >=32 Resistant     AMPICILLIN/SULBACTAM >=32 Resistant     CEFAZOLIN* 8 Resistant      * For uncomplicated UTI caused by E. coli,K. pneumoniae or P. mirabilis: Cefazolin issusceptible if MIC <32 mcg/mL and predictssusceptible to the oral agents cefaclor, cefdinir,cefpodoxime, cefprozil, cefuroxime, cephalexinand loracarbef.    CEFEPIME <=1 Sensitive     CEFTRIAXONE <=1 Sensitive     CIPROFLOXACIN <=0.25 Sensitive     LEVOFLOXACIN <=0.12 Sensitive     ERTAPENEM <=0.5 Sensitive     GENTAMICIN <=1 Sensitive  IMIPENEM <=0.25 Sensitive     NITROFURANTOIN <=16 Sensitive     PIP/TAZO 8 Sensitive     TOBRAMYCIN <=1 Sensitive     TRIMETH/SULFA* <=20 Sensitive      * For uncomplicated UTI caused by E.  coli,K. pneumoniae or P. mirabilis: Cefazolin issusceptible if MIC <32 mcg/mL and predictssusceptible to the oral agents cefaclor, cefdinir,cefpodoxime, cefprozil, cefuroxime, cephalexinand loracarbef.Legend:S = Susceptible  I = IntermediateR = Resistant  NS = Not susceptible* = Not tested  NR = Not reported**NN = See antimicrobic comments  Iron, TIBC and Ferritin Panel     Status: None   Collection Time: 12/26/18  9:27 AM  Result Value Ref Range   Ferritin CANCELED     Comment: TEST NOT PERFORMED . Duplicate test.  Result canceled by the ancillary.   COMPLETE METABOLIC PANEL WITH GFR     Status: None   Collection Time: 12/26/18  9:27 AM  Result Value Ref Range   Glucose, Bld CANCELED     Comment: TEST NOT PERFORMED . Duplicate test.  Result canceled by the ancillary.   Thyroid Peroxidase Antibodies (TPO) (REFL)     Status: None   Collection Time: 12/26/18  9:27 AM  Result Value Ref Range   Thyroperoxidase Ab SerPl-aCnc CANCELED     Comment: TEST NOT PERFORMED . Duplicate test.  Result canceled by the ancillary.   Thyroid stimulating immunoglobulin     Status: None   Collection Time: 12/26/18  9:27 AM  Result Value Ref Range   TSI <89     Comment: UNITS OF MEASURE: % baseline REFERENCE RANGE: <140 . Thyroid stimulating immunoglobulins (TSI) can engage the TSH receptors resulting in hyperthyroidism in Graves' disease patients. TSI levels can be useful in monitoring the clinical outcome of Graves' disease as well as assessing the potential for hyperthyroidism from maternal-fetal transfer. TSI results greater than or equal to (>=) 140% of the Reference Control are considered positive. Marland Kitchen NOTE: A serum TSH level greater than 350 micro-International Units/mL can interfere with the TSI bioassay and potentially give false positive results. . Patients who are pregnant and are suspected of having hyperthyroidism should have both TSI and human Chorionic Gonadotropin(hCG)  tests measured. A serum hCG level greater than 40,625 mIU/mL can interfere with the TSI bioassay and may give false negative results. In these patients it is recommended that a second TSI be obtained when the hCG concentration falls below 40,625 mIU /mL (usually after approximately 20-weeks gestation). . The analytical performance characteristics of this assay have been determined by [facility], [place], [place]. The modifications have not been cleared or approved by the FDA. This assay has been validated pursuant to the CLIA regulations and is used for clinical purposes. .   CBC     Status: None   Collection Time: 12/26/18  9:27 AM  Result Value Ref Range   WBC CANCELED     Comment: TEST NOT PERFORMED . Duplicate test.  Result canceled by the ancillary.   POCT Urinalysis Dipstick     Status: Abnormal   Collection Time: 12/26/18  9:36 AM  Result Value Ref Range   Color, UA Yellow    Clarity, UA Cloudy    Glucose, UA Negative Negative   Bilirubin, UA Small    Ketones, UA Trace    Spec Grav, UA >=1.030 (A) 1.010 - 1.025   Blood, UA Trace-intact    pH, UA 5.5 5.0 - 8.0   Protein, UA Positive (A) Negative  Comment: 100 mg/dl   Urobilinogen, UA 0.2 0.2 or 1.0 E.U./dL   Nitrite, UA Negative    Leukocytes, UA Small (1+) (A) Negative   Appearance     Odor    Iron, TIBC and Ferritin Panel     Status: None   Collection Time: 12/26/18 10:03 AM  Result Value Ref Range   Iron 97 45 - 160 mcg/dL   TIBC 447 250 - 450 mcg/dL (calc)   %SAT 22 16 - 45 % (calc)   Ferritin 100 16 - 288 ng/mL  Thyroid stimulating immunoglobulin     Status: None   Collection Time: 12/26/18 10:03 AM  Result Value Ref Range   TSI CANCELED     Comment: TEST NOT PERFORMED . Duplicate test.  Result canceled by the ancillary.   Thyroid Peroxidase Antibodies (TPO) (REFL)     Status: None   Collection Time: 12/26/18 10:03 AM  Result Value Ref Range   Thyroperoxidase Ab  SerPl-aCnc <1 <9 IU/mL  CBC     Status: Abnormal   Collection Time: 12/26/18 10:03 AM  Result Value Ref Range   WBC 7.1 3.8 - 10.8 Thousand/uL   RBC 3.64 (L) 3.80 - 5.10 Million/uL   Hemoglobin 11.7 11.7 - 15.5 g/dL   HCT 34.2 (L) 35.0 - 45.0 %   MCV 94.0 80.0 - 100.0 fL   MCH 32.1 27.0 - 33.0 pg   MCHC 34.2 32.0 - 36.0 g/dL   RDW 12.3 11.0 - 15.0 %   Platelets 326 140 - 400 Thousand/uL   MPV 12.2 7.5 - 12.5 fL  COMPLETE METABOLIC PANEL WITH GFR     Status: Abnormal   Collection Time: 12/26/18 10:03 AM  Result Value Ref Range   Glucose, Bld 99 65 - 99 mg/dL    Comment: .            Fasting reference interval .    BUN 22 7 - 25 mg/dL   Creat 1.01 (H) 0.60 - 0.93 mg/dL    Comment: For patients >73 years of age, the reference limit for Creatinine is approximately 13% higher for people identified as African-American. .    GFR, Est Non African American 56 (L) > OR = 60 mL/min/1.53m2   GFR, Est African American 64 > OR = 60 mL/min/1.66m2   BUN/Creatinine Ratio 22 6 - 22 (calc)   Sodium 139 135 - 146 mmol/L   Potassium 4.3 3.5 - 5.3 mmol/L   Chloride 104 98 - 110 mmol/L   CO2 26 20 - 32 mmol/L   Calcium 9.8 8.6 - 10.4 mg/dL   Total Protein 7.1 6.1 - 8.1 g/dL   Albumin 4.4 3.6 - 5.1 g/dL   Globulin 2.7 1.9 - 3.7 g/dL (calc)   AG Ratio 1.6 1.0 - 2.5 (calc)   Total Bilirubin 0.3 0.2 - 1.2 mg/dL   Alkaline phosphatase (APISO) 83 37 - 153 U/L   AST 22 10 - 35 U/L   ALT 23 6 - 29 U/L  POC Hemoccult Bld/Stl (3-Cd Home Screen)     Status: None   Collection Time: 01/01/19 12:02 PM  Result Value Ref Range   Card #1 Date 12/27/2018    Fecal Occult Blood, POC Negative Negative   Card #2 Date 12/28/2018    Card #2 Fecal Occult Blod, POC Negative    Card #3 Date 12/30/2018    Card #3 Fecal Occult Blood, POC Negative      Assessment and Plan: 73 y.o. female with  Weight loss and fatigue resolved.  Unclear etiology.  Given her complete resolution of symptoms doubtful thyroid has  anything to do with her symptoms especially as follow along thyroid tests were normal.  Plan for watchful waiting and recheck with new PCP for physical in February.  Recommend Dr. Sheppard Coil or Dr. Zigmund Daniel.  Likely will need lab recheck at that time.  Monitor hypertension at home.  I spent 15 minutes with this patient, greater than 50% was face-to-face time counseling regarding differential diagnosis, next steps, treatment plan, and follow-up plan.Marland Kitchen   PDMP not reviewed this encounter. No orders of the defined types were placed in this encounter.  No orders of the defined types were placed in this encounter.    Historical information moved to improve visibility of documentation.  Past Medical History:  Diagnosis Date  . Cataract 2017   Cataract surgery in 01-2016 and 02-2016  . GERD (gastroesophageal reflux disease)   . History of iron deficiency 06/13/2014  . Hyperlipidemia   . Hypertension   . Nerve pain   . Seasonal allergies    Past Surgical History:  Procedure Laterality Date  . ABDOMINAL HYSTERECTOMY    . BREAST BIOPSY    . BREAST CYST EXCISION    . CATARACT EXTRACTION W/ INTRAOCULAR LENS  IMPLANT, BILATERAL Bilateral 01-2016, 02-2016  . CATARACT EXTRACTION W/ INTRAOCULAR LENS & ANTERIOR VITRECTOMY Bilateral 01-29-2016, 02-19-2016   Right eye in 01-2016 and Left eye 02-2016  . CESAREAN SECTION    . CESAREAN SECTION    . CHOLECYSTECTOMY    . LUMBAR DISC SURGERY    . TONSILLECTOMY     Social History   Tobacco Use  . Smoking status: Never Smoker  . Smokeless tobacco: Never Used  Substance Use Topics  . Alcohol use: Yes    Comment: may have a glass of wine but very, very seldom.   family history includes Alcohol abuse in her paternal uncle and paternal uncle; Breast cancer in her maternal aunt, maternal aunt, maternal aunt, and mother; Cancer in her mother and paternal uncle; Dementia in her maternal aunt, maternal aunt, maternal aunt, mother, and sister; Heart  disease in her paternal grandmother; Hyperlipidemia in her brother, mother, sister, sister, and sister; Hypertension in her brother, mother, sister, sister, sister, and son; Stroke in her maternal grandmother.  Medications: Current Outpatient Medications  Medication Sig Dispense Refill  . aspirin 81 MG tablet Take 81 mg by mouth daily.    Marland Kitchen azelastine (ASTELIN) 0.1 % nasal spray Place 2 sprays into both nostrils 2 (two) times daily. Use in each nostril as directed (Patient not taking: Reported on 12/26/2018) 30 mL 12  . cefdinir (OMNICEF) 300 MG capsule Take 1 capsule (300 mg total) by mouth 2 (two) times daily. 14 capsule 0  . Cyanocobalamin (B-12 PO) Take by mouth.    . estradiol (ESTRACE) 0.5 MG tablet TAKE ONE-HALF TABLET BY  MOUTH DAILY 45 tablet 3  . fluconazole (DIFLUCAN) 150 MG tablet Take 1 pill po for yeast infection. Repeat in 3 days if symptoms still present. 2 tablet 1  . fluocinolone (VANOS) 0.01 % cream Apply topically 2 (two) times daily. To affected area(s) as needed for mild facial rash 15 g 0  . fluticasone (FLONASE) 50 MCG/ACT nasal spray USE 2 SPRAYS IN EACH  NOSTRIL DAILY 48 g 3  . gabapentin (NEURONTIN) 600 MG tablet 1 po am, 1 po lunch and 2 po qhs 450 tablet 3  . gemfibrozil (LOPID) 600 MG tablet TAKE  1 TABLET BY MOUTH 2  TIMES DAILY BEFORE A MEAL. 180 tablet 3  . HYDROcodone-acetaminophen (NORCO) 10-325 MG tablet Take 1 tablet by mouth 3 (three) times daily.    Marland Kitchen loratadine (CLARITIN) 10 MG tablet Take 10 mg by mouth daily.    . metoprolol tartrate (LOPRESSOR) 50 MG tablet TAKE 1 TABLET BY MOUTH TWO  TIMES DAILY 180 tablet 3  . MULTIPLE VITAMIN PO Take by mouth.    . Multiple Vitamins-Minerals (MULTIVITAMIN WITH MINERALS) tablet Take 1 tablet by mouth daily.    . Psyllium (FIBER) 0.52 g CAPS Take by mouth.    . Wheat Dextrin (BENEFIBER DRINK MIX PO) Take by mouth.     No current facility-administered medications for this visit.    Allergies  Allergen Reactions  .  Minocycline Diarrhea, Nausea And Vomiting and Other (See Comments)    Stomach pain    . Sulfa Antibiotics Swelling  . Amoxicillin-Pot Clavulanate Diarrhea  . Lipitor [Atorvastatin] Other (See Comments)    Diarrhea and insomnia  . Pravastatin Diarrhea     Discussed warning signs or symptoms. Please see discharge instructions. Patient expresses understanding.

## 2019-03-01 DIAGNOSIS — M542 Cervicalgia: Secondary | ICD-10-CM | POA: Diagnosis not present

## 2019-03-01 DIAGNOSIS — G5603 Carpal tunnel syndrome, bilateral upper limbs: Secondary | ICD-10-CM | POA: Diagnosis not present

## 2019-03-01 DIAGNOSIS — G603 Idiopathic progressive neuropathy: Secondary | ICD-10-CM | POA: Diagnosis not present

## 2019-03-01 DIAGNOSIS — G2581 Restless legs syndrome: Secondary | ICD-10-CM | POA: Diagnosis not present

## 2019-04-18 ENCOUNTER — Other Ambulatory Visit: Payer: Self-pay

## 2019-04-18 ENCOUNTER — Ambulatory Visit (INDEPENDENT_AMBULATORY_CARE_PROVIDER_SITE_OTHER): Payer: Medicare Other

## 2019-04-18 DIAGNOSIS — Z1231 Encounter for screening mammogram for malignant neoplasm of breast: Secondary | ICD-10-CM

## 2019-04-19 ENCOUNTER — Other Ambulatory Visit: Payer: Self-pay | Admitting: Family Medicine

## 2019-04-19 DIAGNOSIS — R928 Other abnormal and inconclusive findings on diagnostic imaging of breast: Secondary | ICD-10-CM

## 2019-04-23 ENCOUNTER — Other Ambulatory Visit: Payer: Self-pay

## 2019-04-23 ENCOUNTER — Ambulatory Visit
Admission: RE | Admit: 2019-04-23 | Discharge: 2019-04-23 | Disposition: A | Discharge status: Home / Self Care | Payer: Medicare Other | Source: Ambulatory Visit | Attending: Family Medicine | Admitting: Family Medicine

## 2019-04-23 DIAGNOSIS — R928 Other abnormal and inconclusive findings on diagnostic imaging of breast: Secondary | ICD-10-CM

## 2019-04-23 DIAGNOSIS — N6489 Other specified disorders of breast: Secondary | ICD-10-CM | POA: Diagnosis not present

## 2019-04-23 NOTE — Progress Notes (Signed)
As discussed with you by the radiologist your mammogram was reassuring.

## 2019-05-24 DIAGNOSIS — G5603 Carpal tunnel syndrome, bilateral upper limbs: Secondary | ICD-10-CM | POA: Diagnosis not present

## 2019-05-24 DIAGNOSIS — G2581 Restless legs syndrome: Secondary | ICD-10-CM | POA: Diagnosis not present

## 2019-05-24 DIAGNOSIS — M5417 Radiculopathy, lumbosacral region: Secondary | ICD-10-CM | POA: Diagnosis not present

## 2019-05-24 DIAGNOSIS — M542 Cervicalgia: Secondary | ICD-10-CM | POA: Diagnosis not present

## 2019-06-14 ENCOUNTER — Encounter: Payer: Medicare Other | Admitting: Family Medicine

## 2019-06-29 ENCOUNTER — Encounter: Payer: Medicare Other | Admitting: Osteopathic Medicine

## 2019-06-29 ENCOUNTER — Encounter: Payer: Medicare Other | Admitting: Nurse Practitioner

## 2019-07-09 ENCOUNTER — Ambulatory Visit: Payer: Medicare Other | Attending: Internal Medicine

## 2019-07-09 DIAGNOSIS — Z23 Encounter for immunization: Secondary | ICD-10-CM | POA: Insufficient documentation

## 2019-07-09 NOTE — Progress Notes (Signed)
   Covid-19 Vaccination Clinic  Name:  Cynthia Adams    MRN: [id number] DOB: 1945-10-31  07/09/2019  [name] was observed post Covid-19 immunization for 30 minutes based on pre-vaccination screening without incidence. She was provided with Vaccine Information Sheet and instruction to access the V-Safe system.   [name] was instructed to call 911 with any severe reactions post vaccine: Marland Kitchen Difficulty breathing  . Swelling of your face and throat  . A fast heartbeat  . A bad rash all over your body  . Dizziness and weakness    Immunizations Administered    Name Date Dose VIS Date Route   Pfizer COVID-19 Vaccine 07/09/2019  9:45 AM 0.3 mL 04/20/2019 Intramuscular   Manufacturer: [place]   Lot: [id number]   [place]: [id number]

## 2019-07-10 ENCOUNTER — Other Ambulatory Visit: Payer: Self-pay

## 2019-07-10 MED ORDER — GABAPENTIN 600 MG PO TABS: ORAL_TABLET | ORAL | 3 refills | Status: DC

## 2019-07-22 ENCOUNTER — Other Ambulatory Visit: Payer: Self-pay | Admitting: Family Medicine

## 2019-07-31 ENCOUNTER — Ambulatory Visit (INDEPENDENT_AMBULATORY_CARE_PROVIDER_SITE_OTHER): Payer: Medicare Other

## 2019-07-31 ENCOUNTER — Encounter: Payer: Self-pay | Admitting: Osteopathic Medicine

## 2019-07-31 ENCOUNTER — Other Ambulatory Visit: Payer: Self-pay

## 2019-07-31 ENCOUNTER — Ambulatory Visit (INDEPENDENT_AMBULATORY_CARE_PROVIDER_SITE_OTHER): Payer: Medicare Other | Admitting: Osteopathic Medicine

## 2019-07-31 VITALS — BP 157/70 | HR 54 | Temp 98.3°F | Wt 150.0 lb

## 2019-07-31 DIAGNOSIS — Z8639 Personal history of other endocrine, nutritional and metabolic disease: Secondary | ICD-10-CM

## 2019-07-31 DIAGNOSIS — Z0000 Encounter for general adult medical examination without abnormal findings: Secondary | ICD-10-CM | POA: Diagnosis not present

## 2019-07-31 DIAGNOSIS — M8589 Other specified disorders of bone density and structure, multiple sites: Secondary | ICD-10-CM

## 2019-07-31 DIAGNOSIS — K219 Gastro-esophageal reflux disease without esophagitis: Secondary | ICD-10-CM | POA: Diagnosis not present

## 2019-07-31 DIAGNOSIS — I10 Essential (primary) hypertension: Secondary | ICD-10-CM | POA: Diagnosis not present

## 2019-07-31 DIAGNOSIS — M542 Cervicalgia: Secondary | ICD-10-CM

## 2019-07-31 DIAGNOSIS — R7303 Prediabetes: Secondary | ICD-10-CM

## 2019-07-31 DIAGNOSIS — E782 Mixed hyperlipidemia: Secondary | ICD-10-CM

## 2019-07-31 NOTE — Progress Notes (Signed)
Cynthia Adams is a 74 y.o. female who presents to  [place] at [facility]  today, 07/31/19, seeking care for the following: . Routine check-up: Pre-DM, postmenopausal, seasonal allergies, HLD, GERD, HTN       ASSESSMENT & PLAN with other pertinent history/findings:  The primary encounter diagnosis was Annual physical exam. Diagnoses of Essential hypertension, Gastroesophageal reflux disease, unspecified whether esophagitis present, Osteopenia of multiple sites, Mixed hyperlipidemia, Prediabetes, History of iron deficiency, and Neck pain were also pertinent to this visit.  Pt reports BP a bit high for her today Not checking at home - encouraged she check BP few times per week Goal 140/90 or less  Advised BP here is in range to start Rx, she'd like to defer for now    BP Readings from Last 3 Encounters:  07/31/19 (!) 157/70  02/26/19 (!) 150/79  12/26/18 (!) 140/56     Patient Instructions  General Preventive Care  Most recent routine screening labs: looked ok in 12/2018  Tobacco: don't!   Alcohol: responsible moderation is ok for most adults - if you have concerns about your alcohol intake, please talk to me!   Exercise: as tolerated to reduce risk of cardiovascular disease and diabetes. Strength training will also prevent osteoporosis.   Mental health: if need for mental health care (medicines, counseling, other), or concerns about moods, please let me know!   Sexual health: if need for STD testing, or if concerns with libido/pain problems, please let me know!  Advanced Directive: Living Will and/or Healthcare Power of Attorney recommended for all adults, regardless of age or health.  Vaccines  Flu vaccine: recommended for almost everyone, every fall.   Shingles vaccine: all done!   Pneumonia vaccines: all done!   Tetanus booster: due 2026  COVID shot: vaccine #2 when due!  Cancer screenings   Colon cancer  screening: age 81-75  Breast cancer screening: mammogram optional after age 33  Cervical cancer screening: Can stop Pap at age 44 and/or w/ hysterectomy.   Lung cancer screening: not needed for non-smokers  Infection screenings . HIV, Gonorrhea/Chlamydia: screening as needed.  . Hepatitis C: recommended for anyone born 88-1965 . TB: certain at-risk populations, or depending on work requirements and/or travel history Other . Bone Density Test: recommended for women at age 42 every 2 years      Orders Placed This Encounter  Procedures  . DG Cervical Spine Complete  . CBC  . Lipid panel  . TSH  . Hemoglobin A1c  . COMPLETE METABOLIC PANEL WITH GFR  . Fe+TIBC+Fer    No orders of the defined types were placed in this encounter.   Constitutional:  . VSS, see nurse notes . General Appearance: alert, well-developed, well-nourished, NAD Eyes: Marland Kitchen Normal lids and conjunctive, non-icteric sclera Ears, Nose, Mouth, Throat: . Normal external auditory canal and TM bilaterally Neck: . No masses, trachea midline . No thyroid enlargement/tenderness/mass appreciated Respiratory: . Normal respiratory effort . No dullness/hyper-resonance to percussion . Breath sounds normal, no wheeze/rhonchi/rales Cardiovascular: . S1/S2 normal, no murmur/rub/gallop auscultated . No lower extremity edema Musculoskeletal:  . Gait normal . No clubbing/cyanosis of digits Neurological: . No cranial nerve deficit on limited exam . Motor and sensation intact and symmetric Psychiatric: . Normal judgment/insight . Normal mood and affect    Follow-up instructions: Return in about 6 months (around 01/31/2020) for routine check-up, see me sooner if needed .  BP (!) 157/70 (BP Location: Left Arm, Patient Position: Sitting, Cuff Size: Normal)   Pulse (!) 54   Temp 98.3 F (36.8 C) (Oral)   Wt 150 lb (68 kg)   BMI 29.29  kg/m   Current Meds  Medication Sig  . aspirin 81 MG tablet Take 81 mg by mouth daily.  Marland Kitchen azelastine (ASTELIN) 0.1 % nasal spray Place 2 sprays into both nostrils 2 (two) times daily. Use in each nostril as directed  . Cyanocobalamin (B-12 PO) Take by mouth.  . estradiol (ESTRACE) 0.5 MG tablet TAKE ONE-HALF TABLET BY  MOUTH DAILY  . fluocinolone (VANOS) 0.01 % cream Apply topically 2 (two) times daily. To affected area(s) as needed for mild facial rash  . fluticasone (FLONASE) 50 MCG/ACT nasal spray USE 2 SPRAYS IN EACH  NOSTRIL DAILY  . gabapentin (NEURONTIN) 600 MG tablet 1 po am, 1 po lunch and 2 po qhs  . gemfibrozil (LOPID) 600 MG tablet TAKE 1 TABLET BY MOUTH 2  TIMES DAILY BEFORE A MEAL.  Marland Kitchen HYDROcodone-acetaminophen (NORCO) 10-325 MG tablet Take 1 tablet by mouth 3 (three) times daily.  Marland Kitchen loratadine (CLARITIN) 10 MG tablet Take 10 mg by mouth daily.  . metoprolol tartrate (LOPRESSOR) 50 MG tablet TAKE 1 TABLET BY MOUTH TWO  TIMES DAILY  . Multiple Vitamins-Minerals (MULTIVITAMIN WITH MINERALS) tablet Take 1 tablet by mouth daily.  Marland Kitchen omeprazole (PRILOSEC) 40 MG capsule Take 40 mg by mouth daily.  . Psyllium (FIBER) 0.52 g CAPS Take by mouth.  . Wheat Dextrin (BENEFIBER DRINK MIX PO) Take by mouth.    No results found for this or any previous visit (from the past 72 hour(s)).  No results found.  Depression screen [facility] 2/9 07/31/2019 06/13/2018 09/27/2017  Decreased Interest 1 0 0  Down, Depressed, Hopeless 1 0 0  PHQ - 2 Score 2 0 0  Altered sleeping 1 - -  Tired, decreased energy 1 - -  Change in appetite 2 - -  Feeling bad or failure about yourself  1 - -  Trouble concentrating 0 - -  Moving slowly or fidgety/restless 0 - -  Suicidal thoughts 0 - -  PHQ-9 Score 7 - -  Difficult doing work/chores Not difficult at all - -    GAD 7 : Generalized Anxiety Score 07/31/2019  Nervous, Anxious, on Edge 0  Control/stop worrying 1  Worry too much - different things 1  Trouble  relaxing 0  Restless 0  Easily annoyed or irritable 1  Afraid - awful might happen 0  Total GAD 7 Score 3  Anxiety Difficulty Not difficult at all      All questions at time of visit were answered - patient instructed to contact office with any additional concerns or updates.  ER/RTC precautions were reviewed with the patient.  Please note: voice recognition software was used to produce this document, and typos may escape review. Please contact Dr. Sheppard Coil for any needed clarifications.

## 2019-07-31 NOTE — Patient Instructions (Addendum)
Xray today, +/- MRI or physical therapy  Labs today   General Preventive Care  Most recent routine screening labs: looked ok in 12/2018  Tobacco: don't!   Alcohol: responsible moderation is ok for most adults - if you have concerns about your alcohol intake, please talk to me!   Exercise: as tolerated to reduce risk of cardiovascular disease and diabetes. Strength training will also prevent osteoporosis.   Mental health: if need for mental health care (medicines, counseling, other), or concerns about moods, please let me know!   Sexual health: if need for STD testing, or if concerns with libido/pain problems, please let me know!  Advanced Directive: Living Will and/or Healthcare Power of Attorney recommended for all adults, regardless of age or health.  Vaccines  Flu vaccine: recommended for almost everyone, every fall.   Shingles vaccine: all done!   Pneumonia vaccines: all done!   Tetanus booster: due 2026  COVID shot: vaccine #2 when due!  Cancer screenings   Colon cancer screening: age 13-75  Breast cancer screening: mammogram optional after age 82  Cervical cancer screening: Can stop Pap at age 22 and/or w/ hysterectomy.   Lung cancer screening: not needed for non-smokers  Infection screenings . HIV, Gonorrhea/Chlamydia: screening as needed.  . Hepatitis C: recommended for anyone born 61-1965 . TB: certain at-risk populations, or depending on work requirements and/or travel history Other . Bone Density Test: recommended for women at age 42 every 2 years

## 2019-08-01 LAB — TSH: TSH: 4.08 mIU/L (ref 0.40–4.50)

## 2019-08-01 LAB — CBC
HCT: 35 % (ref 35.0–45.0)
Hemoglobin: 11.7 g/dL (ref 11.7–15.5)
MCH: 31.1 pg (ref 27.0–33.0)
MCHC: 33.4 g/dL (ref 32.0–36.0)
MCV: 93.1 fL (ref 80.0–100.0)
MPV: 12.6 fL — ABNORMAL HIGH (ref 7.5–12.5)
Platelets: 286 10*3/uL (ref 140–400)
RBC: 3.76 10*6/uL — ABNORMAL LOW (ref 3.80–5.10)
RDW: 12.3 % (ref 11.0–15.0)
WBC: 7.8 10*3/uL (ref 3.8–10.8)

## 2019-08-01 LAB — COMPLETE METABOLIC PANEL WITH GFR
AG Ratio: 1.9 (calc) (ref 1.0–2.5)
ALT: 13 U/L (ref 6–29)
AST: 20 U/L (ref 10–35)
Albumin: 4.7 g/dL (ref 3.6–5.1)
Alkaline phosphatase (APISO): 90 U/L (ref 37–153)
BUN/Creatinine Ratio: 16 (calc) (ref 6–22)
BUN: 16 mg/dL (ref 7–25)
CO2: 27 mmol/L (ref 20–32)
Calcium: 9.8 mg/dL (ref 8.6–10.4)
Chloride: 105 mmol/L (ref 98–110)
Creat: 0.97 mg/dL — ABNORMAL HIGH (ref 0.60–0.93)
GFR, Est African American: 67 mL/min/{1.73_m2} (ref >=60)
GFR, Est Non African American: 58 mL/min/{1.73_m2} — ABNORMAL LOW (ref >=60)
Globulin: 2.5 g/dL (calc) (ref 1.9–3.7)
Glucose, Bld: 104 mg/dL — ABNORMAL HIGH (ref 65–99)
Potassium: 4.6 mmol/L (ref 3.5–5.3)
Sodium: 142 mmol/L (ref 135–146)
Total Bilirubin: 0.3 mg/dL (ref 0.2–1.2)
Total Protein: 7.2 g/dL (ref 6.1–8.1)

## 2019-08-01 LAB — LIPID PANEL
Cholesterol: 186 mg/dL (ref <200)
HDL: 38 mg/dL — ABNORMAL LOW (ref >=50)
LDL Cholesterol (Calc): 116 mg/dL (calc) — ABNORMAL HIGH
Non-HDL Cholesterol (Calc): 148 mg/dL (calc) — ABNORMAL HIGH (ref <130)
Total CHOL/HDL Ratio: 4.9 (calc) (ref <5.0)
Triglycerides: 200 mg/dL — ABNORMAL HIGH (ref <150)

## 2019-08-01 LAB — IRON,TIBC AND FERRITIN PANEL
%SAT: 17 % (calc) (ref 16–45)
Ferritin: 72 ng/mL (ref 16–288)
Iron: 73 ug/dL (ref 45–160)
TIBC: 429 mcg/dL (calc) (ref 250–450)

## 2019-08-01 LAB — HEMOGLOBIN A1C
Hgb A1c MFr Bld: 5.6 % of total Hgb (ref <5.7)
Mean Plasma Glucose: 114 (calc)
eAG (mmol/L): 6.3 (calc)

## 2019-08-01 MED ORDER — OMEGA-3-ACID ETHYL ESTERS 1 G PO CAPS: 2.0000 g | ORAL_CAPSULE | Freq: Two times a day (BID) | ORAL | 3 refills | Status: DC

## 2019-08-01 NOTE — Addendum Note (Signed)
Addended by: Maryla Morrow on: 08/01/2019 10:36 AM   Modules accepted: Orders

## 2019-08-01 NOTE — Addendum Note (Signed)
Addended by: Maryla Morrow on: 08/01/2019 06:31 PM   Modules accepted: Orders

## 2019-08-02 ENCOUNTER — Telehealth: Payer: Self-pay

## 2019-08-02 DIAGNOSIS — L219 Seborrheic dermatitis, unspecified: Secondary | ICD-10-CM

## 2019-08-02 MED ORDER — METRONIDAZOLE 0.75 % EX GEL: 1.0000 "application " | Freq: Two times a day (BID) | CUTANEOUS | 3 refills | Status: DC

## 2019-08-02 NOTE — Telephone Encounter (Signed)
Pt's husband has been updated of med refill. No other inquiries during call.

## 2019-08-02 NOTE — Telephone Encounter (Signed)
Pt called requesting med refill for metronidazole gel. Written by historical provider. Provider forgot to send in med refill for rx to [facility] at time of appt. Thanks.

## 2019-08-07 ENCOUNTER — Ambulatory Visit: Payer: Medicare Other | Attending: Internal Medicine

## 2019-08-07 DIAGNOSIS — Z23 Encounter for immunization: Secondary | ICD-10-CM

## 2019-08-07 NOTE — Progress Notes (Signed)
   Covid-19 Vaccination Clinic  Name:  Cynthia Adams    MRN: [id number] DOB: [birth year]  08/07/2019  Cynthia Adams was observed post Covid-19 immunization for 15 minutes without incident. She was provided with Vaccine Information Sheet and instruction to access the V-Safe system.   Cynthia Adams was instructed to call 911 with any severe reactions post vaccine: Marland Kitchen Difficulty breathing  . Swelling of face and throat  . A fast heartbeat  . A bad rash all over body  . Dizziness and weakness   Immunizations Administered    Name Date Dose VIS Date Route   Pfizer COVID-19 Vaccine 08/07/2019  9:33 AM 0.3 mL 04/20/2019 Intramuscular   Manufacturer: [place]   Lot: [id number]   [place]: [id number]

## 2019-08-13 ENCOUNTER — Ambulatory Visit (INDEPENDENT_AMBULATORY_CARE_PROVIDER_SITE_OTHER): Payer: Medicare Other | Admitting: Rehabilitative and Restorative Service Providers"

## 2019-08-13 ENCOUNTER — Other Ambulatory Visit: Payer: Self-pay

## 2019-08-13 DIAGNOSIS — R293 Abnormal posture: Secondary | ICD-10-CM | POA: Diagnosis not present

## 2019-08-13 DIAGNOSIS — M6281 Muscle weakness (generalized): Secondary | ICD-10-CM

## 2019-08-13 DIAGNOSIS — M542 Cervicalgia: Secondary | ICD-10-CM | POA: Diagnosis not present

## 2019-08-13 DIAGNOSIS — R29898 Other symptoms and signs involving the musculoskeletal system: Secondary | ICD-10-CM

## 2019-08-13 NOTE — Therapy (Addendum)
[facility] [place], [place], [address] Phone: [phone]   Fax:  [phone]  Physical Therapy Evaluation  Patient Details  Name: Cynthia Adams MRN: [id number] Date of Birth: 07/12/1945 Referring Provider (PT): Londell Moh, DO   Encounter Date: 08/13/2019  PT End of Session - 08/13/19 1012    Visit Number  1    Number of Visits  12    Date for PT Re-Evaluation  09/24/19    PT Start Time  0932    PT Stop Time  1018    PT Time Calculation (min)  46 min    Activity Tolerance  Patient tolerated treatment well    Behavior During Therapy  [facility] for tasks assessed/performed       Past Medical History:  Diagnosis Date  . Cataract 2017   Cataract surgery in 01-2016 and 02-2016  . GERD (gastroesophageal reflux disease)   . History of iron deficiency 06/13/2014  . Hyperlipidemia   . Hypertension   . Nerve pain   . Seasonal allergies     Past Surgical History:  Procedure Laterality Date  . ABDOMINAL HYSTERECTOMY    . BREAST BIOPSY    . BREAST CYST EXCISION    . CATARACT EXTRACTION W/ INTRAOCULAR LENS  IMPLANT, BILATERAL Bilateral 01-2016, 02-2016  . CATARACT EXTRACTION W/ INTRAOCULAR LENS & ANTERIOR VITRECTOMY Bilateral 01-29-2016, 02-19-2016   Right eye in 01-2016 and Left eye 02-2016  . CESAREAN SECTION    . CESAREAN SECTION    . CHOLECYSTECTOMY    . LUMBAR DISC SURGERY    . TONSILLECTOMY      There were no vitals filed for this visit.   Subjective Assessment - 08/13/19 0935    Subjective  The patient reports neck pain began over a year ago and she reports if she overdoes she gets headaches and radiating pain to her hands.  She has had injections in her neck every 3-6 months.    Pertinent History  low back surgery 2008 (uses hydrocodone), pre DM, HLD, GERD, HTN.    Diagnostic tests  Mild anterolisthesis of C4 onC5 and C5 on C6 is seen    Patient Stated Goals  reduce the neck pain    Currently  in Pain?  Yes    Pain Score  3     Pain Location  Neck    Pain Orientation  Lower;Mid    Pain Descriptors / Indicators  Sore    Pain Type  Chronic pain    Pain Radiating Towards  into shoulders and hands    Pain Onset  More than a month ago    Pain Frequency  Constant    Aggravating Factors   cooking, daily activities, overuse    Pain Relieving Factors  rest, medication         [facility] PT Assessment - 08/13/19 0948      Assessment   Medical Diagnosis  neck pain    Referring Provider (PT)  Londell Moh, DO    Onset Date/Surgical Date  08/01/19   over 1 year ago onset- referral date listed   Hand Dominance  Left   cleans with R hand   Prior Therapy  none      Precautions   Precautions  None      Restrictions   Weight Bearing Restrictions  No      Balance Screen   Has the patient fallen in the past 6 months  No  Has the patient had a decrease in activity level because of a fear of falling?   No    Is the patient reluctant to leave their home because of a fear of falling?   No      Home Environment   Living Environment  Private residence    Living Arrangements  Spouse/significant other    Type of [place]      Prior Function   Level of Independence  Independent    Leisure  cooking and daily activities in the home      Observation/Other Assessments   Focus on Therapeutic Outcomes (FOTO)   37% limited      Sensation   Light Touch  Appears Intact    Additional Comments  has radiating pain into hands and arm, but notes it is not tingling or numbness, but pain t/o both arms and hands.      Posture/Postural Control   Posture/Postural Control  Postural limitations    Postural Limitations  Rounded Shoulders;Forward head      ROM / Strength   AROM / PROM / Strength  AROM;Strength      AROM   Overall AROM   Deficits    Overall AROM Comments  more pain to the left side    AROM Assessment Site  Cervical;Shoulder    Right/Left Shoulder  --   some discomfort with  IR, ROM is WFLs   Cervical Flexion  40    Cervical Extension  35    Cervical - Right Side Bend  18    Cervical - Left Side Bend  20   pain to the left   Cervical - Right Rotation  68    Cervical - Left Rotation  52      Strength   Overall Strength  Within functional limits for tasks performed    Strength Assessment Site  Shoulder;Elbow    Right/Left Shoulder  Right;Left    Right Shoulder Flexion  5/5    Right Shoulder ABduction  5/5    Left Shoulder Flexion  5/5    Left Shoulder ABduction  5/5    Right/Left Elbow  Right;Left    Right Elbow Flexion  5/5    Left Elbow Flexion  5/5      Flexibility   Soft Tissue Assessment /Muscle Length  --      Palpation   Spinal mobility  can feel anterior positioning of mid cervical spine; did not mobilize due to positioning    Palpation comment  tender to palpation suboccipitals, biateral scalenes, paraspinal musculature, and upper traps bilat                Objective measurements completed on examination: See above findings.      [facility] Adult PT Treatment/Exercise - 08/13/19 0948      Exercises   Exercises  Neck      Neck Exercises: Supine   Neck Retraction  5 reps    Other Supine Exercise  towel roll upper thoracic opening x 1 minute with arms at 45 degree abduction      Modalities   Modalities  Electrical Stimulation;Moist Heat      Moist Heat Therapy   Number Minutes Moist Heat  12 Minutes    Moist Heat Location  Cervical      Electrical Stimulation   Electrical Stimulation Location  low cervical/upper thoracic    Electrical Stimulation Action  interferential    Electrical Stimulation Parameters  to tolerance  Electrical Stimulation Goals  Pain             PT Education - 08/13/19 1014    Education Details  HEP    Person(s) Educated  Patient    Methods  Explanation;Demonstration;Handout    Comprehension  Returned demonstration;Verbalized understanding          PT Long Term Goals - 08/13/19 1256       PT LONG TERM GOAL #1   Title  The patient will return demo HEP for posture, neck AROM, flexibility and strength.    Time  6    Period  Weeks    Target Date  09/24/19      PT LONG TERM GOAL #2   Title  The patient will reduce functional limitation per FOTO from 37% to < or equal to 32%.    Time  6    Period  Weeks    Target Date  09/24/19      PT LONG TERM GOAL #3   Title  The patient will improve L cervical rotation from 52 degrees to > or equal to 65 degrees.    Time  6    Period  Weeks    Target Date  09/24/19      PT LONG TERM GOAL #4   Title  The patient will report no pain at rest.    Baseline  3/10    Time  6    Period  Weeks    Target Date  09/24/19      PT LONG TERM GOAL #5   Title  The patient will subjectively report decreased incidence of radiating pain into hands by 50% since eval.    Time  6    Period  Weeks    Target Date  09/24/19             Plan - 08/13/19 1257    Clinical Impression Statement  The patient is a 74 yo female presenting to OP physical therapy with neck pain that is chronic in nature.  She presents with impairments in posture with rounded shoulders and forward head, decreased ROM worse to the L side for neck rotation/sidebending, decreased flexibility in postural musculature, tenderness to palpation worse in suboccipitals.  PT to address deficits to reduce pain and improve tolerance to daily activities.    Personal Factors and Comorbidities  Comorbidity 1    Comorbidities  HTN    Examination-Activity Limitations  Reach Overhead;Lift    Examination-Participation Restrictions  Cleaning    Stability/Clinical Decision Making  Stable/Uncomplicated    Clinical Decision Making  Low    Rehab Potential  Good    PT Frequency  2x / week    PT Duration  6 weeks    PT Treatment/Interventions  ADLs/Self Care Home Management;Therapeutic activities;Therapeutic exercise;Manual techniques;Taping;Dry needling;Patient/family  education;Cryotherapy;Electrical Stimulation;Iontophoresis 4mg /ml Dexamethasone;Moist Heat    PT Next Visit Plan  anterior chest stretching (doorway) for home, cervical deep stabilizer activation, parascapular strengthening, UBE, and progressive strengthening    PT Home Exercise Plan  Access Code: [email or web address]    Consulted and Agree with Plan of Care  Patient       Patient will benefit from skilled therapeutic intervention in order to improve the following deficits and impairments:  Pain, Postural dysfunction, Decreased strength, Hypomobility, Decreased range of motion, Increased fascial restricitons  Visit Diagnosis: Cervicalgia - Plan: PT plan of care cert/re-cert  Abnormal posture - Plan: PT plan of care cert/re-cert  Muscle weakness (generalized) - Plan:  PT plan of care cert/re-cert  Other symptoms and signs involving the musculoskeletal system - Plan: PT plan of care cert/re-cert     Problem List Patient Active Problem List   Diagnosis Date Noted  . Diverticulosis 04/17/2018  . Seborrheic dermatitis 09/20/2017  . Dysphagia 06/02/2016  . Cyst of left breast 06/13/2014  . Spinal stenosis of lumbar region 06/13/2014  . Bilateral renal cysts 06/13/2014  . History of iron deficiency 06/13/2014  . Essential hypertension 05/13/2014  . Hyperlipidemia 05/13/2014  . Osteopenia 05/13/2014  . GERD (gastroesophageal reflux disease) 05/13/2014  . Prediabetes 05/13/2014    [place] , PT 08/13/2019, 1:18 PM  [facility] [place], [place], [address] Phone: [phone]   Fax:  [phone]  Name: Cynthia Adams MRN: [id number] Date of Birth: [birth year]

## 2019-08-13 NOTE — Patient Instructions (Signed)
Access Code: [email or web address] URL: [email or web address] Date: 08/13/2019 Prepared by: Rudell Cobb  Exercises Supine Chin Tuck - 2 x daily - 7 x weekly - 1 sets - 5 reps - 3 second hold Supine Thoracic Mobilization Towel Roll Vertical with Arm Stretch - 2 x daily - 7 x weekly - 1 sets - 1 reps - 60 seconds hold

## 2019-08-17 ENCOUNTER — Ambulatory Visit: Payer: Medicare Other | Admitting: Rehabilitative and Restorative Service Providers"

## 2019-08-17 ENCOUNTER — Other Ambulatory Visit: Payer: Self-pay

## 2019-08-17 ENCOUNTER — Encounter: Payer: Self-pay | Admitting: Rehabilitative and Restorative Service Providers"

## 2019-08-17 DIAGNOSIS — M542 Cervicalgia: Secondary | ICD-10-CM | POA: Diagnosis not present

## 2019-08-17 DIAGNOSIS — R293 Abnormal posture: Secondary | ICD-10-CM | POA: Diagnosis not present

## 2019-08-17 DIAGNOSIS — M6281 Muscle weakness (generalized): Secondary | ICD-10-CM | POA: Diagnosis not present

## 2019-08-17 DIAGNOSIS — R29898 Other symptoms and signs involving the musculoskeletal system: Secondary | ICD-10-CM

## 2019-08-17 NOTE — Therapy (Signed)
[place], [place], [address] Phone: [phone]   Fax:  [phone]  Physical Therapy Treatment  Patient Details  Name: Cynthia Adams MRN: [id number] Date of Birth: 1945/10/21 Referring Provider (PT): Londell Moh, DO   Encounter Date: 08/17/2019  PT End of Session - 08/17/19 1124    Visit Number  2    Number of Visits  12    Date for PT Re-Evaluation  09/24/19    PT Start Time  1016    PT Stop Time  1103    PT Time Calculation (min)  47 min    Activity Tolerance  Patient tolerated treatment well    Behavior During Therapy  [facility] for tasks assessed/performed       Past Medical History:  Diagnosis Date  . Cataract 2017   Cataract surgery in 01-2016 and 02-2016  . GERD (gastroesophageal reflux disease)   . History of iron deficiency 06/13/2014  . Hyperlipidemia   . Hypertension   . Nerve pain   . Seasonal allergies     Past Surgical History:  Procedure Laterality Date  . ABDOMINAL HYSTERECTOMY    . BREAST BIOPSY    . BREAST CYST EXCISION    . CATARACT EXTRACTION W/ INTRAOCULAR LENS  IMPLANT, BILATERAL Bilateral 01-2016, 02-2016  . CATARACT EXTRACTION W/ INTRAOCULAR LENS & ANTERIOR VITRECTOMY Bilateral 01-29-2016, 02-19-2016   Right eye in 01-2016 and Left eye 02-2016  . CESAREAN SECTION    . CESAREAN SECTION    . CHOLECYSTECTOMY    . LUMBAR DISC SURGERY    . TONSILLECTOMY      There were no vitals filed for this visit.  Subjective Assessment - 08/17/19 1018    Subjective  The patient feels improvement in neck tightness with exercises.  She reports the heat and e-stim were relaxing.    Pertinent History  low back surgery 2008 (uses hydrocodone), pre DM, HLD, GERD, HTN.    Diagnostic tests  Mild anterolisthesis of C4 onC5 and C5 on C6 is seen    Patient Stated Goals  reduce the neck pain    Currently in Pain?  Yes    Pain Score  2     Pain Location  Neck    Pain Orientation   Lower    Pain Descriptors / Indicators  Sore    Pain Type  Chronic pain    Pain Onset  More than a month ago    Pain Frequency  Constant    Aggravating Factors   daily activities    Pain Relieving Factors  rest, medication                       [facility] Adult PT Treatment/Exercise - 08/17/19 1020      Exercises   Exercises  Neck      Neck Exercises: Machines for Strengthening   UBE (Upper Arm Bike)  UBE:  L 1 x 2.5 minutes forward, 1.5 minutes backwards      Neck Exercises: Theraband   Scapula Retraction  10 reps;Red    Scapula Retraction Limitations  in supine      Neck Exercises: Standing   Other Standing Exercises  Door frame stretch 60 and 120 degrees.        Neck Exercises: Supine   Other Supine Exercise  scapular retraction red theraband x 10 repetitions with 3 second holds      Neck Exercises: Sidelying   Other Sidelying  Exercise  thoracic open book x 10 reps      Neck Exercises: Prone   Rows  10 reps    Rows Limitations  with arms abducted to 90 degrees    Other Prone Exercise  prone on elbows with lateral rocking for thoracic and shoulder mobility      Modalities   Modalities  Electrical Stimulation;Moist Heat      Moist Heat Therapy   Number Minutes Moist Heat  12 Minutes    Moist Heat Location  Cervical      Electrical Stimulation   Electrical Stimulation Location  low cervical/upper thoracic    Electrical Stimulation Action  interferential    Electrical Stimulation Parameters  to tolerance    Electrical Stimulation Goals  Pain      Manual Therapy   Manual Therapy  Joint mobilization    Manual therapy comments  for gentle mobilization    Joint Mobilization  grade I-II thoracic PA mobility and scapular mobilization             PT Education - 08/17/19 1124    Education Details  HEP    Person(s) Educated  Patient    Methods  Explanation;Demonstration;Handout    Comprehension  Returned demonstration;Verbalized understanding           PT Long Term Goals - 08/13/19 1256      PT LONG TERM GOAL #1   Title  The patient will return demo HEP for posture, neck AROM, flexibility and strength.    Time  6    Period  Weeks    Target Date  09/24/19      PT LONG TERM GOAL #2   Title  The patient will reduce functional limitation per FOTO from 37% to < or equal to 32%.    Time  6    Period  Weeks    Target Date  09/24/19      PT LONG TERM GOAL #3   Title  The patient will improve L cervical rotation from 52 degrees to > or equal to 65 degrees.    Time  6    Period  Weeks    Target Date  09/24/19      PT LONG TERM GOAL #4   Title  The patient will report no pain at rest.    Baseline  3/10    Time  6    Period  Weeks    Target Date  09/24/19      PT LONG TERM GOAL #5   Title  The patient will subjectively report decreased incidence of radiating pain into hands by 50% since eval.    Time  6    Period  Weeks    Target Date  09/24/19            Plan - 08/17/19 1125    Clinical Impression Statement  The patient tolerated progression of HEP well today.  She notes some fatigue with scapular strengthening and feels relaxation ending session with e-stim and heat.  PT to continue to progress to LTGs.    Comorbidities  HTN    Stability/Clinical Decision Making  Stable/Uncomplicated    Rehab Potential  Good    PT Frequency  2x / week    PT Duration  6 weeks    PT Treatment/Interventions  ADLs/Self Care Home Management;Therapeutic activities;Therapeutic exercise;Manual techniques;Taping;Dry needling;Patient/family education;Cryotherapy;Electrical Stimulation;Iontophoresis 4mg /ml Dexamethasone;Moist Heat    PT Next Visit Plan  anterior chest stretching (doorway) for home, cervical deep  stabilizer activation, parascapular strengthening, UBE, and progressive strengthening    PT Home Exercise Plan  Access Code: [email or web address]    Consulted and Agree with Plan of Care  Patient       Patient will benefit from skilled  therapeutic intervention in order to improve the following deficits and impairments:  Pain, Postural dysfunction, Decreased strength, Hypomobility, Decreased range of motion, Increased fascial restricitons  Visit Diagnosis: Cervicalgia  Abnormal posture  Muscle weakness (generalized)  Other symptoms and signs involving the musculoskeletal system     Problem List Patient Active Problem List   Diagnosis Date Noted  . Diverticulosis 04/17/2018  . Seborrheic dermatitis 09/20/2017  . Dysphagia 06/02/2016  . Cyst of left breast 06/13/2014  . Spinal stenosis of lumbar region 06/13/2014  . Bilateral renal cysts 06/13/2014  . History of iron deficiency 06/13/2014  . Essential hypertension 05/13/2014  . Hyperlipidemia 05/13/2014  . Osteopenia 05/13/2014  . GERD (gastroesophageal reflux disease) 05/13/2014  . Prediabetes 05/13/2014    [place] , PT 08/17/2019, 11:26 AM  [facility] [place], [place], [address] Phone: [phone]   Fax:  [phone]  Name: Cynthia Adams MRN: [id number] Date of Birth: 1945-11-30

## 2019-08-20 ENCOUNTER — Encounter: Payer: Self-pay | Admitting: Physical Therapy

## 2019-08-20 ENCOUNTER — Ambulatory Visit: Payer: Medicare Other | Admitting: Physical Therapy

## 2019-08-20 ENCOUNTER — Other Ambulatory Visit: Payer: Self-pay

## 2019-08-20 DIAGNOSIS — M6281 Muscle weakness (generalized): Secondary | ICD-10-CM

## 2019-08-20 DIAGNOSIS — M542 Cervicalgia: Secondary | ICD-10-CM

## 2019-08-20 DIAGNOSIS — R293 Abnormal posture: Secondary | ICD-10-CM | POA: Diagnosis not present

## 2019-08-20 DIAGNOSIS — R29898 Other symptoms and signs involving the musculoskeletal system: Secondary | ICD-10-CM

## 2019-08-20 NOTE — Therapy (Signed)
[place], [place], [address] Phone: [phone]   Fax:  [phone]  Physical Therapy Treatment  Patient Details  Name: Cynthia Adams MRN: [id number] Date of Birth: 11-May-1945 Referring Provider (PT): Londell Moh, DO   Encounter Date: 08/20/2019  PT End of Session - 08/20/19 1149    Visit Number  3    Number of Visits  12    Date for PT Re-Evaluation  09/24/19    PT Start Time  [id number]   pt arrived late   PT Stop Time  1232    PT Time Calculation (min)  43 min    Activity Tolerance  Patient tolerated treatment well;No increased pain    Behavior During Therapy  WFL for tasks assessed/performed       Past Medical History:  Diagnosis Date  . Cataract 2017   Cataract surgery in 01-2016 and 02-2016  . GERD (gastroesophageal reflux disease)   . History of iron deficiency 06/13/2014  . Hyperlipidemia   . Hypertension   . Nerve pain   . Seasonal allergies     Past Surgical History:  Procedure Laterality Date  . ABDOMINAL HYSTERECTOMY    . BREAST BIOPSY    . BREAST CYST EXCISION    . CATARACT EXTRACTION W/ INTRAOCULAR LENS  IMPLANT, BILATERAL Bilateral 01-2016, 02-2016  . CATARACT EXTRACTION W/ INTRAOCULAR LENS & ANTERIOR VITRECTOMY Bilateral 01-29-2016, 02-19-2016   Right eye in 01-2016 and Left eye 02-2016  . CESAREAN SECTION    . CESAREAN SECTION    . CHOLECYSTECTOMY    . LUMBAR DISC SURGERY    . TONSILLECTOMY      There were no vitals filed for this visit.  Subjective Assessment - 08/20/19 1202    Subjective  Pt reports she wasn't able to complete able HEP due to stomach issues.  She started new medication for cholesterol.    Pertinent History  low back surgery 2008 (uses hydrocodone), pre DM, HLD, GERD, HTN.    Diagnostic tests  Mild anterolisthesis of C4 onC5 and C5 on C6 is seen    Patient Stated Goals  reduce the neck pain    Currently in Pain?  Yes    Pain Score  2     Pain Location  Neck    Pain Orientation  Lower;Left;Right    Pain Descriptors / Indicators  Sore    Pain Onset  More than a month ago    Aggravating Factors   daily activities; ie: cleaning.    Pain Relieving Factors  heat, rest         [facility] PT Assessment - 08/20/19 0001      Assessment   Medical Diagnosis  neck pain    Referring Provider (PT)  Londell Moh, DO    Onset Date/Surgical Date  08/01/19   over 1 year ago onset- referral date listed   Hand Dominance  Left   cleans with R hand   Prior Therapy  none      AROM   Cervical - Right Rotation  52    Cervical - Left Rotation  52       [facility] Adult PT Treatment/Exercise - 08/20/19 0001      Neck Exercises: Standing   Other Standing Exercises  Bilat shoulder row x 10; Bilat  shoulder x10; Bilat shoulder ER x 10   all with red theraband   Other Standing Exercises  scap squeeze against noodle x 3 sec x 8  reps; L's x 5 sec hold x 10 reps, W's x 5 sec x 5 reps (cues to keep elbows at side, and chest lifted)      Neck Exercises: Supine   Other Supine Exercise  bilat horz abdct with red band x 10 (cues for form)      Neck Exercises: Sidelying   Other Sidelying Exercise  thoracic open book x 10 reps each side, cues to slow down       Moist Heat Therapy   Number Minutes Moist Heat  10 Minutes    Moist Heat Location  Cervical      Electrical Stimulation   Electrical Stimulation Location  low cervical/upper trap    Electrical Stimulation Action  IFC    Electrical Stimulation Parameters  intensity to tolerance    Electrical Stimulation Goals  Pain      Neck Exercises: Stretches   Other Neck Stretches  3 position ddorway stretch x 15 sec x 2 reps each, trial of high position as unilateral - preferred bilat.               PT Education - 08/20/19 1231    Education Details  TENS info.    Person(s) Educated  Patient    Methods  Explanation;Handout    Comprehension  Verbalized understanding          PT Long  Term Goals - 08/13/19 1256      PT LONG TERM GOAL #1   Title  The patient will return demo HEP for posture, neck AROM, flexibility and strength.    Time  6    Period  Weeks    Target Date  09/24/19      PT LONG TERM GOAL #2   Title  The patient will reduce functional limitation per FOTO from 37% to < or equal to 32%.    Time  6    Period  Weeks    Target Date  09/24/19      PT LONG TERM GOAL #3   Title  The patient will improve L cervical rotation from 52 degrees to > or equal to 65 degrees.    Time  6    Period  Weeks    Target Date  09/24/19      PT LONG TERM GOAL #4   Title  The patient will report no pain at rest.    Baseline  3/10    Time  6    Period  Weeks    Target Date  09/24/19      PT LONG TERM GOAL #5   Title  The patient will subjectively report decreased incidence of radiating pain into hands by 50% since eval.    Time  6    Period  Weeks    Target Date  09/24/19            Plan - 08/20/19 1215    Clinical Impression Statement  Pt requires frequent cues for counting reps, minor cues for form. She reported some fatigue in upper traps with standing scap retraction.  Pt reported neck feeling looser after exercises, however ROM remains unchanged.  Progressing towards goals.    Rehab Potential  Good    PT Frequency  2x / week    PT Duration  6 weeks    PT Treatment/Interventions  ADLs/Self Care Home Management;Therapeutic activities;Therapeutic exercise;Manual techniques;Taping;Dry needling;Patient/family education;Cryotherapy;Electrical Stimulation;Iontophoresis 4mg /ml Dexamethasone;Moist Heat    PT Next Visit Plan  anterior chest stretching (doorway) for home,  cervical deep stabilizer activation, parascapular strengthening, UBE, and progressive strengthening    PT Home Exercise Plan  Access Code: [email or web address]    Consulted and Agree with Plan of Care  Patient       Patient will benefit from skilled therapeutic intervention in order to improve the following  deficits and impairments:  Pain, Postural dysfunction, Decreased strength, Hypomobility, Decreased range of motion, Increased fascial restricitons  Visit Diagnosis: Cervicalgia  Abnormal posture  Muscle weakness (generalized)  Other symptoms and signs involving the musculoskeletal system     Problem List Patient Active Problem List   Diagnosis Date Noted  . Diverticulosis 04/17/2018  . Seborrheic dermatitis 09/20/2017  . Dysphagia 06/02/2016  . Cyst of left breast 06/13/2014  . Spinal stenosis of lumbar region 06/13/2014  . Bilateral renal cysts 06/13/2014  . History of iron deficiency 06/13/2014  . Essential hypertension 05/13/2014  . Hyperlipidemia 05/13/2014  . Osteopenia 05/13/2014  . GERD (gastroesophageal reflux disease) 05/13/2014  . Prediabetes 05/13/2014   Kerin Perna, PTA 08/20/19 12:33 PM  [place], [place], [address] Phone: [phone]   Fax:  [phone]  Name: Cynthia Adams MRN: [id number] Date of Birth: June 08, 1945

## 2019-08-20 NOTE — Patient Instructions (Signed)

## 2019-08-23 ENCOUNTER — Encounter: Payer: Self-pay | Admitting: Physical Therapy

## 2019-08-23 ENCOUNTER — Other Ambulatory Visit: Payer: Self-pay

## 2019-08-23 ENCOUNTER — Ambulatory Visit (INDEPENDENT_AMBULATORY_CARE_PROVIDER_SITE_OTHER): Payer: Medicare Other | Admitting: Physical Therapy

## 2019-08-23 DIAGNOSIS — M6281 Muscle weakness (generalized): Secondary | ICD-10-CM

## 2019-08-23 DIAGNOSIS — M542 Cervicalgia: Secondary | ICD-10-CM | POA: Diagnosis not present

## 2019-08-23 DIAGNOSIS — R29898 Other symptoms and signs involving the musculoskeletal system: Secondary | ICD-10-CM

## 2019-08-23 DIAGNOSIS — R293 Abnormal posture: Secondary | ICD-10-CM

## 2019-08-23 NOTE — Therapy (Signed)
[place], [place], [address] Phone: [phone]   Fax:  [phone]  Physical Therapy Treatment  Patient Details  Name: Cynthia Adams MRN: [id number] Date of Birth: [birth year] Referring Provider (PT): Londell Moh, DO   Encounter Date: 08/23/2019  PT End of Session - 08/23/19 1216    Visit Number  4    Number of Visits  12    Date for PT Re-Evaluation  09/24/19    PT Start Time  [id number]    PT Stop Time  1238    PT Time Calculation (min)  50 min       Past Medical History:  Diagnosis Date  . Cataract 2017   Cataract surgery in 01-2016 and 02-2016  . GERD (gastroesophageal reflux disease)   . History of iron deficiency 06/13/2014  . Hyperlipidemia   . Hypertension   . Nerve pain   . Seasonal allergies     Past Surgical History:  Procedure Laterality Date  . ABDOMINAL HYSTERECTOMY    . BREAST BIOPSY    . BREAST CYST EXCISION    . CATARACT EXTRACTION W/ INTRAOCULAR LENS  IMPLANT, BILATERAL Bilateral 01-2016, 02-2016  . CATARACT EXTRACTION W/ INTRAOCULAR LENS & ANTERIOR VITRECTOMY Bilateral 01-29-2016, 02-19-2016   Right eye in 01-2016 and Left eye 02-2016  . CESAREAN SECTION    . CESAREAN SECTION    . CHOLECYSTECTOMY    . LUMBAR DISC SURGERY    . TONSILLECTOMY      There were no vitals filed for this visit.  Subjective Assessment - 08/23/19 1156    Subjective  Pt reports an increase in neck pain today with radiating symptoms into both arms, She has been taking aleve for the pain.    Diagnostic tests  Mild anterolisthesis of C4 onC5 and C5 on C6 is seen    Patient Stated Goals  reduce the neck pain    Currently in Pain?  Yes    Pain Score  3     Pain Location  Neck    Pain Orientation  Right;Left    Pain Descriptors / Indicators  Aching    Pain Radiating Towards  into bilat shoulder and to elbows    Pain Frequency  Constant    Aggravating Factors   daily activities    Pain  Relieving Factors  aleve         [facility] PT Assessment - 08/23/19 0001      Assessment   Medical Diagnosis  neck pain    Referring Provider (PT)  Londell Moh, DO    Onset Date/Surgical Date  08/01/19   over 1 year ago onset- referral date listed   Hand Dominance  Left   cleans with R hand   Prior Therapy  none      [facility] Adult PT Treatment/Exercise - 08/23/19 0001      Neck Exercises: Machines for Strengthening   UBE (Upper Arm Bike)  L1: 30 sec each way      Shoulder Exercises: Supine   Horizontal ABduction  Strengthening;Both;10 reps    Theraband Level (Shoulder Horizontal ABduction)  Level 1 (Yellow)    Diagonals  Strengthening;Right;Left;10 reps    Theraband Level (Shoulder Diagonals)  Level 1 (Yellow)      Shoulder Exercises: Standing   Horizontal ABduction  Both;10 reps;Theraband    Theraband Level (Shoulder Horizontal ABduction)  Level 1 (Yellow)    External Rotation  Strengthening;Both;10 reps    Theraband Level (Shoulder  External Rotation)  Level 1 (Yellow)   2 sets   Extension  Both;Strengthening;10 reps;Theraband    Theraband Level (Shoulder Extension)  Level 1 (Yellow)    Row  Both;10 reps;Strengthening    Theraband Level (Shoulder Row)  Level 1 (Yellow)   2 sets   Diagonals  --    Theraband Level (Shoulder Diagonals)  --      Moist Heat Therapy   Number Minutes Moist Heat  10 Minutes    Moist Heat Location  Cervical;Lumbar Spine      Electrical Stimulation   Electrical Stimulation Location  low cervical/upper trap    Electrical Stimulation Action  IFC    Electrical Stimulation Parameters  intensity to tolerance x 10 min     Electrical Stimulation Goals  Pain      Manual Therapy   Manual Therapy  Soft tissue mobilization    Soft tissue mobilization  STM to bilat upper trap, levator, cervical paraspinals and suboccipital muscles to decrease restrictions and improve ROM.       Neck Exercises: Stretches   Other Neck Stretches  3 position doorway  stretch x 15 sec x 2 reps each                   PT Long Term Goals - 08/13/19 1256      PT LONG TERM GOAL #1   Title  The patient will return demo HEP for posture, neck AROM, flexibility and strength.    Time  6    Period  Weeks    Target Date  09/24/19      PT LONG TERM GOAL #2   Title  The patient will reduce functional limitation per FOTO from 37% to < or equal to 32%.    Time  6    Period  Weeks    Target Date  09/24/19      PT LONG TERM GOAL #3   Title  The patient will improve L cervical rotation from 52 degrees to > or equal to 65 degrees.    Time  6    Period  Weeks    Target Date  09/24/19      PT LONG TERM GOAL #4   Title  The patient will report no pain at rest.    Baseline  3/10    Time  6    Period  Weeks    Target Date  09/24/19      PT LONG TERM GOAL #5   Title  The patient will subjectively report decreased incidence of radiating pain into hands by 50% since eval.    Time  6    Period  Weeks    Target Date  09/24/19            Plan - 08/23/19 1235    Clinical Impression Statement  Pt requires frequent cues to for counting reps, minor cues for scapular retraction. She tolerated treatment well with no increase in pain. Pt reported decrease in neck discomfort/pain after e-stim and heat application; she stated that she is looking into a TENs unit for home use. Will assess neck ROM next visit.Pt is progressing towards all goals.    Personal Factors and Comorbidities  Comorbidity 1    Comorbidities  HTN    Examination-Activity Limitations  Reach Overhead;Lift    Examination-Participation Restrictions  Cleaning    Stability/Clinical Decision Making  Stable/Uncomplicated    Rehab Potential  Good    PT Frequency  2x /  week    PT Duration  6 weeks    PT Treatment/Interventions  ADLs/Self Care Home Management;Therapeutic activities;Therapeutic exercise;Manual techniques;Taping;Dry needling;Patient/family education;Cryotherapy;Electrical  Stimulation;Iontophoresis 4mg /ml Dexamethasone;Moist Heat    PT Next Visit Plan  anterior chest stretching (doorway) for home, cervical deep stabilizer activation, parascapular strengthening, UBE, and progressive strengthening    PT Home Exercise Plan  Access Code: [email or web address]       Patient will benefit from skilled therapeutic intervention in order to improve the following deficits and impairments:  Pain, Postural dysfunction, Decreased strength, Hypomobility, Decreased range of motion, Increased fascial restricitons  Visit Diagnosis: Cervicalgia  Abnormal posture  Muscle weakness (generalized)  Other symptoms and signs involving the musculoskeletal system     Problem List Patient Active Problem List   Diagnosis Date Noted  . Diverticulosis 04/17/2018  . Seborrheic dermatitis 09/20/2017  . Dysphagia 06/02/2016  . Cyst of left breast 06/13/2014  . Spinal stenosis of lumbar region 06/13/2014  . Bilateral renal cysts 06/13/2014  . History of iron deficiency 06/13/2014  . Essential hypertension 05/13/2014  . Hyperlipidemia 05/13/2014  . Osteopenia 05/13/2014  . GERD (gastroesophageal reflux disease) 05/13/2014  . Prediabetes 05/13/2014   Kerin Perna, PTA 08/23/19 5:50 PM  [facility] [place], [place], [address] Phone: [phone]   Fax:  [phone]  Name: Cynthia Adams MRN: [id number] Date of Birth: [birth year]

## 2019-08-27 ENCOUNTER — Ambulatory Visit: Payer: Medicare Other | Admitting: Rehabilitative and Restorative Service Providers"

## 2019-08-27 ENCOUNTER — Other Ambulatory Visit: Payer: Self-pay

## 2019-08-27 ENCOUNTER — Encounter: Payer: Self-pay | Admitting: Rehabilitative and Restorative Service Providers"

## 2019-08-27 DIAGNOSIS — M542 Cervicalgia: Secondary | ICD-10-CM | POA: Diagnosis not present

## 2019-08-27 DIAGNOSIS — R293 Abnormal posture: Secondary | ICD-10-CM

## 2019-08-27 DIAGNOSIS — M6281 Muscle weakness (generalized): Secondary | ICD-10-CM

## 2019-08-27 DIAGNOSIS — R29898 Other symptoms and signs involving the musculoskeletal system: Secondary | ICD-10-CM

## 2019-08-27 NOTE — Therapy (Signed)
[place], [place], [address] Phone: [phone]   Fax:  [phone]  Physical Therapy Treatment  Patient Details  Name: Cynthia Adams MRN: [id number] Date of Birth: [birth year] Referring Provider (PT): Londell Moh, DO   Encounter Date: 08/27/2019  PT End of Session - 08/27/19 0931    Visit Number  5    Number of Visits  12    Date for PT Re-Evaluation  09/24/19    PT Start Time  0930    PT Stop Time  1016    PT Time Calculation (min)  46 min    Activity Tolerance  Patient tolerated treatment well;No increased pain    Behavior During Therapy  WFL for tasks assessed/performed       Past Medical History:  Diagnosis Date  . Cataract 2017   Cataract surgery in 01-2016 and 02-2016  . GERD (gastroesophageal reflux disease)   . History of iron deficiency 06/13/2014  . Hyperlipidemia   . Hypertension   . Nerve pain   . Seasonal allergies     Past Surgical History:  Procedure Laterality Date  . ABDOMINAL HYSTERECTOMY    . BREAST BIOPSY    . BREAST CYST EXCISION    . CATARACT EXTRACTION W/ INTRAOCULAR LENS  IMPLANT, BILATERAL Bilateral 01-2016, 02-2016  . CATARACT EXTRACTION W/ INTRAOCULAR LENS & ANTERIOR VITRECTOMY Bilateral 01-29-2016, 02-19-2016   Right eye in 01-2016 and Left eye 02-2016  . CESAREAN SECTION    . CESAREAN SECTION    . CHOLECYSTECTOMY    . LUMBAR DISC SURGERY    . TONSILLECTOMY      There were no vitals filed for this visit.  Subjective Assessment - 08/27/19 0932    Subjective  The patient reports her hands have not been hurting.  She feels like it depends on what she does at home.  If she cleans the bathrooms, she gets soreness.  She notes she thought about not coming back because she is not sure PT is helping.  She also gets pain with weather changes.    Pertinent History  low back surgery 2008 (uses hydrocodone), pre DM, HLD, GERD, HTN.    Patient Stated Goals   reduce the neck pain    Currently in Pain?  No/denies                       [facility] Adult PT Treatment/Exercise - 08/27/19 0939      Exercises   Exercises  Neck      Neck Exercises: Standing   Other Standing Exercises  bilateral anterior chest stretch doorway W and at 120 degrees shoulder flexion.        Neck Exercises: Supine   Neck Retraction  5 reps    Other Supine Exercise  shoulder horizontal abduction bilaterally working on 10 second holds x 10 repetitions      Neck Exercises: Sidelying   Lateral Flexion  Right;Left   3 reps   Lateral Flexion Limitations  pain lifting to the R side    Other Sidelying Exercise  PROM scapular D1 and D2 pattern and shoulder circles      Shoulder Exercises: Supine   Other Supine Exercises  Supine horizontal shoulder abduction shoulders to 90 for anterior chest stretching.      Modalities   Modalities  Traction      Traction   Type of Traction  Cervical    Min (lbs)  10  Max (lbs)  15    Hold Time  60    Rest Time  20    Time  15 minutes      Manual Therapy   Manual Therapy  Joint mobilization;Soft tissue mobilization;Scapular mobilization    Manual therapy comments  sitting and prone    Joint Mobilization  Seated thoracic mobilization (patient positioned with arms and head resting on elevated mat table) performing PA thoracic mobs grade II-III to tolerance.  Prone PA mobilizations and lateral glide mid thoracic spine grade II.  Supine cervical distraction x 30 second holds- patient tolerated ( moved to manual traction)    Soft tissue mobilization  STM to bilateral scalenes with R scalenes tighter than L side.    Scapular Mobilization  sidelying parascapular mobilization and STM to teres, rhomboids, scalenes, and levator                  PT Long Term Goals - 08/13/19 1256      PT LONG TERM GOAL #1   Title  The patient will return demo HEP for posture, neck AROM, flexibility and strength.    Time  6     Period  Weeks    Target Date  09/24/19      PT LONG TERM GOAL #2   Title  The patient will reduce functional limitation per FOTO from 37% to < or equal to 32%.    Time  6    Period  Weeks    Target Date  09/24/19      PT LONG TERM GOAL #3   Title  The patient will improve L cervical rotation from 52 degrees to > or equal to 65 degrees.    Time  6    Period  Weeks    Target Date  09/24/19      PT LONG TERM GOAL #4   Title  The patient will report no pain at rest.    Baseline  3/10    Time  6    Period  Weeks    Target Date  09/24/19      PT LONG TERM GOAL #5   Title  The patient will subjectively report decreased incidence of radiating pain into hands by 50% since eval.    Time  6    Period  Weeks    Target Date  09/24/19            Plan - 08/27/19 0955    Clinical Impression Statement  The patient arrived today reporting no significant change with therapy.  She is not participating with home exercise program and notes she already feels strong.  PT discussed that the type of strengthening activities we are focusing on are to improve postural alignment with focus on opening anterior chest musculature and strengthening around scapular musculature.  We also added traction today for cervical lengthening and to reduce radiating symptoms to the hands (which she notes is intermittent in nature).    Personal Factors and Comorbidities  --    Comorbidities  HTN    Examination-Activity Limitations  --    Examination-Participation Restrictions  --    Stability/Clinical Decision Making  --    Rehab Potential  Good    PT Frequency  2x / week    PT Duration  6 weeks    PT Treatment/Interventions  ADLs/Self Care Home Management;Therapeutic activities;Therapeutic exercise;Manual techniques;Taping;Dry needling;Patient/family education;Cryotherapy;Electrical Stimulation;Iontophoresis 4mg /ml Dexamethasone;Moist Heat;Traction    PT Next Visit Plan  Inquire about response  to traction, anterior  chest stretching (doorway) for home, cervical deep stabilizer activation, parascapular strengthening, UBE, and progressive strengthening    PT Home Exercise Plan  Access Code: [email or web address]    Consulted and Agree with Plan of Care  Patient       Patient will benefit from skilled therapeutic intervention in order to improve the following deficits and impairments:     Visit Diagnosis: Cervicalgia - Plan: PT plan of care cert/re-cert  Abnormal posture - Plan: PT plan of care cert/re-cert  Muscle weakness (generalized) - Plan: PT plan of care cert/re-cert  Other symptoms and signs involving the musculoskeletal system - Plan: PT plan of care cert/re-cert     Problem List Patient Active Problem List   Diagnosis Date Noted  . Diverticulosis 04/17/2018  . Seborrheic dermatitis 09/20/2017  . Dysphagia 06/02/2016  . Cyst of left breast 06/13/2014  . Spinal stenosis of lumbar region 06/13/2014  . Bilateral renal cysts 06/13/2014  . History of iron deficiency 06/13/2014  . Essential hypertension 05/13/2014  . Hyperlipidemia 05/13/2014  . Osteopenia 05/13/2014  . GERD (gastroesophageal reflux disease) 05/13/2014  . Prediabetes 05/13/2014    [place] , PT 08/27/2019, 12:58 PM  [facility] [place], [place], [address] Phone: [phone]   Fax:  [phone]  Name: Cynthia Adams MRN: [id number] Date of Birth: 27-Sep-1945

## 2019-08-30 ENCOUNTER — Other Ambulatory Visit: Payer: Self-pay

## 2019-08-30 ENCOUNTER — Ambulatory Visit: Payer: Medicare Other | Admitting: Rehabilitative and Restorative Service Providers"

## 2019-08-30 DIAGNOSIS — R293 Abnormal posture: Secondary | ICD-10-CM

## 2019-08-30 DIAGNOSIS — M542 Cervicalgia: Secondary | ICD-10-CM | POA: Diagnosis not present

## 2019-08-30 DIAGNOSIS — R29898 Other symptoms and signs involving the musculoskeletal system: Secondary | ICD-10-CM

## 2019-08-30 DIAGNOSIS — M6281 Muscle weakness (generalized): Secondary | ICD-10-CM

## 2019-08-30 NOTE — Patient Instructions (Signed)
Access Code: [email or web address] URL: [email or web address] Date: 08/30/2019 Prepared by: Rudell Cobb  Exercises Towel Roll Stretch - 2 x daily - 7 x weekly - 10 reps - 1 sets Supine Thoracic Mobilization Towel Roll Vertical with Arm Stretch - 2 x daily - 7 x weekly - 1 sets - 1 reps - 60 seconds hold Sidelying Thoracic Rotation with Open Book - 2 x daily - 7 x weekly - 10 reps - 1 sets Single Arm Doorway Pec Stretch at 60 Elevation - 2 x daily - 7 x weekly - 1 sets - 10 reps Doorway Pec Stretch at 120 Degrees Abduction - 2 x daily - 7 x weekly - 10 reps - 1 sets

## 2019-08-30 NOTE — Therapy (Addendum)
[place], [place], [address] Phone: [phone]   Fax:  [phone]  Physical Therapy Treatment and Discharge Summary  Patient Details  Name: Cynthia Adams MRN: [id number] Date of Birth: 1945-09-04 Referring Provider (PT): Londell Moh, DO   Encounter Date: 08/30/2019  PT End of Session - 08/30/19 1028    Visit Number  6    Number of Visits  12    Date for PT Re-Evaluation  09/24/19    PT Start Time  1020    PT Stop Time  1100    PT Time Calculation (min)  40 min    Activity Tolerance  Patient tolerated treatment well;No increased pain    Behavior During Therapy  WFL for tasks assessed/performed       Past Medical History:  Diagnosis Date  . Cataract 2017   Cataract surgery in 01-2016 and 02-2016  . GERD (gastroesophageal reflux disease)   . History of iron deficiency 06/13/2014  . Hyperlipidemia   . Hypertension   . Nerve pain   . Seasonal allergies     Past Surgical History:  Procedure Laterality Date  . ABDOMINAL HYSTERECTOMY    . BREAST BIOPSY    . BREAST CYST EXCISION    . CATARACT EXTRACTION W/ INTRAOCULAR LENS  IMPLANT, BILATERAL Bilateral 01-2016, 02-2016  . CATARACT EXTRACTION W/ INTRAOCULAR LENS & ANTERIOR VITRECTOMY Bilateral 01-29-2016, 02-19-2016   Right eye in 01-2016 and Left eye 02-2016  . CESAREAN SECTION    . CESAREAN SECTION    . CHOLECYSTECTOMY    . LUMBAR DISC SURGERY    . TONSILLECTOMY      There were no vitals filed for this visit.  Subjective Assessment - 08/30/19 1017    Subjective  The patient reports that she took alleve an hour ago.  She did not feel the traction helped, but instead made her feel sore.  She also felt increased pain with joint mobilization last visit.    Pertinent History  low back surgery 2008 (uses hydrocodone), pre DM, HLD, GERD, HTN.    Diagnostic tests  Mild anterolisthesis of C4 onC5 and C5 on C6 is seen    Patient Stated  Goals  reduce the neck pain    Currently in Pain?  Yes    Pain Score  2     Pain Location  Neck    Pain Orientation  Right;Left;Mid;Lower    Pain Descriptors / Indicators  Aching    Pain Type  Chronic pain    Pain Onset  More than a month ago    Pain Frequency  Constant    Aggravating Factors   daily activities    Pain Relieving Factors  alleve                       [facility] Adult PT Treatment/Exercise - 08/30/19 1040      Self-Care   Self-Care  Other Self-Care Comments    Other Self-Care Comments   discussed home walking program beginning with 10 minute intervals to tolerance and slow pace; discussed progression of home program,; discussed modifications to irritating activities (cleaning tub) and how to reduce reaching and UE/neck pain.      Exercises   Exercises  Neck      Neck Exercises: Standing   Other Standing Exercises  bilateral anterior chest stretch in door frame x 20 second holds 60 degrees and 100 degrees.      Neck  Exercises: Supine   Neck Retraction  5 reps    Neck Retraction Limitations  patient c/o pain and that this exercise feels like it blocks off her swallowing; PT modified to be a posterior towel roll stretch instead of a chin tuck    Other Supine Exercise  shoulder horizontal abduction bilaterally working on 10 second holds x 10 repetitions      Neck Exercises: Sidelying   Other Sidelying Exercise  thoracic open book x 10 reps             PT Education - 08/30/19 1224    Education Details  reviewed HEP and modified in order to be focused on gentle stretching and anterior chest opening for posture    Person(s) Educated  Patient    Methods  Explanation;Demonstration;Handout    Comprehension  Verbalized understanding;Returned demonstration          PT Long Term Goals - 08/30/19 1036      PT LONG TERM GOAL #1   Title  The patient will return demo HEP for posture, neck AROM, flexibility and strength.    Time  6    Period  Weeks     Status  Achieved      PT LONG TERM GOAL #2   Title  The patient will reduce functional limitation per FOTO from 37% to < or equal to 32%.    Baseline  21% limitation at d/c    Time  6    Period  Weeks    Status  Achieved      PT LONG TERM GOAL #3   Title  The patient will improve L cervical rotation from 52 degrees to > or equal to 65 degrees.    Baseline  Did not retest today-- patinet requesting d/c focused on HEP and self care.    Time  6    Period  Weeks    Status  Deferred      PT LONG TERM GOAL #4   Title  The patient will report no pain at rest.    Baseline  2/10, varies depending on activity level.    Time  6    Period  Weeks    Status  Partially Met      PT LONG TERM GOAL #5   Title  The patient will subjectively report decreased incidence of radiating pain into hands by 50% since eval.    Baseline  *patient reports this does not occurs a lot, unless she has done a lot of work in the house; she feels no change since first visit.    Time  6    Period  Weeks    Status  Not Met            Plan - 08/30/19 1228    Clinical Impression Statement  The patient met 2 LTGs and partially met 1 LTG.  She requests d/c from PT due to not feeling improvement with intervention.  We discussed her compliance with HEP and she reports she has not participated with the ther ex in the home due to not having the energy and feeling sore from PT.  PT focused on working on self care of saving time to perform gentle walking in 10 minute intervals to improve tolerance to activity.  We also modified HEP today to be gentle, postural stretches that she feels she can tolerate.  PT encouraged her to participate with home activity as we do not expect change without participation.  Patient  d/c at this time with HEP and walking program per her request.  Although subjectively, she reports no improvement, she did improve per functional outcomes testing.    Comorbidities  HTN    Rehab Potential  Good    PT  Frequency  2x / week    PT Duration  6 weeks    PT Treatment/Interventions  ADLs/Self Care Home Management;Therapeutic activities;Therapeutic exercise;Manual techniques;Taping;Dry needling;Patient/family education;Cryotherapy;Electrical Stimulation;Iontophoresis 87m/ml Dexamethasone;Moist Heat;Traction    PT Next Visit Plan  Inquire about response to traction, anterior chest stretching (doorway) for home, cervical deep stabilizer activation, parascapular strengthening, UBE, and progressive strengthening    PT Home Exercise Plan  Access Code: [email or web address]   Consulted and Agree with Plan of Care  Patient       Patient will benefit from skilled therapeutic intervention in order to improve the following deficits and impairments:  Pain, Postural dysfunction, Decreased strength, Hypomobility, Decreased range of motion, Increased fascial restricitons  Visit Diagnosis: Cervicalgia  Abnormal posture  Muscle weakness (generalized)  Other symptoms and signs involving the musculoskeletal system    PHYSICAL THERAPY DISCHARGE SUMMARY  Visits from Start of Care: 6  Current functional level related to goals / functional outcomes: See above   Remaining deficits: Patient notes continued pain   Education / Equipment: HEP, postural education, home walking program, strategies to deal with chronic pain.  Plan: Patient agrees to discharge.  Patient goals were partially met. Patient is being discharged due to the patient's request.  ?????          Thank you for the referral of this patient. CRudell Cobb MPT  Problem List Patient Active Problem List   Diagnosis Date Noted  . Diverticulosis 04/17/2018  . Seborrheic dermatitis 09/20/2017  . Dysphagia 06/02/2016  . Cyst of left breast 06/13/2014  . Spinal stenosis of lumbar region 06/13/2014  . Bilateral renal cysts 06/13/2014  . History of iron deficiency 06/13/2014  . Essential hypertension 05/13/2014  . Hyperlipidemia 05/13/2014  .  Osteopenia 05/13/2014  . GERD (gastroesophageal reflux disease) 05/13/2014  . Prediabetes 05/13/2014    [place] PT 08/30/2019, 12:33 PM  [facility] [place] [address]Phone: [phone]  Fax:  [phone] Name: JTENISHA FLEECEMRN: [id number] of Birth: [birth year]

## 2019-09-06 ENCOUNTER — Telehealth: Payer: Self-pay

## 2019-09-06 NOTE — Telephone Encounter (Signed)
Pt left a vm msg earlier today stating she had an reaction to the Lovaza rx and has stopped taking the rx. She was having severe diarrhea. Pt also mentioned that she's been having bilateral swelling on legs / feet x 2 weeks. Contacted pt, informed pt there were no appts available for today and was advised to go to [facility] for an evaluation. Pt declined at time of call. Aware provider will be out of the office next week. Pt was agreeable to see another provider. Appt has been scheduled with Joy on 09/10/19. Pt aware to seek urgent care if symptoms worsen or changes over the weekend.

## 2019-09-10 ENCOUNTER — Encounter: Payer: Self-pay | Admitting: Medical-Surgical

## 2019-09-10 ENCOUNTER — Other Ambulatory Visit: Payer: Self-pay

## 2019-09-10 ENCOUNTER — Ambulatory Visit (INDEPENDENT_AMBULATORY_CARE_PROVIDER_SITE_OTHER): Payer: Medicare Other | Admitting: Medical-Surgical

## 2019-09-10 VITALS — BP 162/76 | HR 50 | Temp 98.1°F | Ht 60.0 in | Wt 151.2 lb

## 2019-09-10 DIAGNOSIS — R600 Localized edema: Secondary | ICD-10-CM

## 2019-09-10 NOTE — Progress Notes (Signed)
Subjective:    CC: Bilateral lower extremity edema  HPI: Very pleasant 74 year old female presenting today for 2.5 weeks of bilateral lower extremity edema.  Reports that she started on Lovaza as instructed by her PCP but had a reaction to this where she developed diarrhea and lower extremity edema.  The diarrhea has since resolved after she stopped the medication.  The swelling has continued from knee down.  Elevating her feet usually helps but over the last few days it has not resolved the swelling in the her feet.  Swelling worsens throughout the day as she is more active.  Reports she is voiding well with usual amount of urine, no changes in color or concentration.  Drinking plenty of fluids but does endorse that she does not drink enough water.  Denies chest pain, shortness of breath, palpitations, dizziness, orthopnea.  Blood pressure elevated again in office today 162/76.  Several previous elevated readings during office visits but no medications initiated.  Patient reports her blood pressures at at home are better and that her blood pressure at the office will come down.  She does endorse that she forgets to take her blood pressure at home frequently.  Limit intake of sodium rich foods.  I reviewed the past medical history, family history, social history, surgical history, and allergies today and no changes were needed.  Please see the problem list section below in epic for further details.  Past Medical History: Past Medical History:  Diagnosis Date  . Cataract 2017   Cataract surgery in 01-2016 and 02-2016  . GERD (gastroesophageal reflux disease)   . History of iron deficiency 06/13/2014  . Hyperlipidemia   . Hypertension   . Nerve pain   . Seasonal allergies    Past Surgical History: Past Surgical History:  Procedure Laterality Date  . ABDOMINAL HYSTERECTOMY    . BREAST BIOPSY    . BREAST CYST EXCISION    . CATARACT EXTRACTION W/ INTRAOCULAR LENS  IMPLANT, BILATERAL Bilateral  01-2016, 02-2016  . CATARACT EXTRACTION W/ INTRAOCULAR LENS & ANTERIOR VITRECTOMY Bilateral 01-29-2016, 02-19-2016   Right eye in 01-2016 and Left eye 02-2016  . CESAREAN SECTION    . CESAREAN SECTION    . CHOLECYSTECTOMY    . LUMBAR DISC SURGERY    . TONSILLECTOMY     Social History: Social History   Socioeconomic History  . Marital status: Married    Spouse name: Jenny Reichmann  . Number of children: 2  . Years of education: 20  . Highest education level: 12th grade  Occupational History  . Occupation: Glass blower/designer    Comment: retired  Tobacco Use  . Smoking status: Never Smoker  . Smokeless tobacco: Never Used  Substance and Sexual Activity  . Alcohol use: Yes    Comment: may have a glass of wine but very, very seldom.  . Drug use: No  . Sexual activity: Not Currently  Other Topics Concern  . Not on file  Social History Narrative   Goes out for birthdays, meets with class of 66 every month. Wants to get back into exercising. Works on one day of the week   Social Determinants of Radio broadcast assistant Strain:   . Difficulty of Paying Living Expenses:   Food Insecurity:   . Worried About Charity fundraiser in the Last Year:   . Arboriculturist in the Last Year:   Transportation Needs:   . Film/video editor (Medical):   Marland Kitchen Lack of  Transportation (Non-Medical):   Physical Activity:   . Days of Exercise per Week:   . Minutes of Exercise per Session:   Stress:   . Feeling of Stress :   Social Connections:   . Frequency of Communication with Friends and Family:   . Frequency of Social Gatherings with Friends and Family:   . Attends Religious Services:   . Active Member of Clubs or Organizations:   . Attends Archivist Meetings:   Marland Kitchen Marital Status:    Family History: Family History  Problem Relation Age of Onset  . Cancer Mother        breast  . Dementia Mother   . Hypertension Mother   . Hyperlipidemia Mother   . Breast cancer Mother   .  Dementia Sister   . Hypertension Sister   . Hyperlipidemia Sister   . Hyperlipidemia Brother   . Hypertension Brother   . Hypertension Son   . Dementia Maternal Aunt   . Breast cancer Maternal Aunt   . Cancer Paternal Uncle   . Alcohol abuse Paternal Uncle   . Stroke Maternal Grandmother   . Heart disease Paternal Grandmother   . Hyperlipidemia Sister   . Hypertension Sister   . Hyperlipidemia Sister   . Hypertension Sister   . Dementia Maternal Aunt   . Breast cancer Maternal Aunt   . Dementia Maternal Aunt   . Breast cancer Maternal Aunt   . Alcohol abuse Paternal Uncle    Allergies: Allergies  Allergen Reactions  . Lovaza [Omega-3-Acid Ethyl Esters] Diarrhea  . Minocycline Diarrhea, Nausea And Vomiting and Other (See Comments)    Stomach pain    . Pantoprazole Swelling  . Sulfa Antibiotics Swelling  . Amoxicillin-Pot Clavulanate Diarrhea  . Lipitor [Atorvastatin] Other (See Comments)    Diarrhea and insomnia  . Pravastatin Diarrhea   Medications: See med rec.  Review of Systems: No fevers, chills, night sweats, weight loss, chest pain, or shortness of breath.   Objective:    General: Well Developed, well nourished, and in no acute distress.  Neuro: Alert and oriented x3.  HEENT: Normocephalic, atraumatic.  Skin: Warm and dry. Cardiac: Regular rate and rhythm, no murmurs rubs or gallops, no lower extremity edema.  Respiratory: Clear to auscultation bilaterally. Not using accessory muscles, speaking in full sentences.   Impression and Recommendations:    1. Bilateral lower extremity edema With the absence of respiratory symptoms, suspect venous insufficiency combined with uncontrolled hypertension.  Discussed this with patient and the possibility of starting a mild diuretic to help with both concerns.  She is worried about starting a diuretic at this time and would like to try conservative measures to help with swelling.  Advised to obtain compression stockings  and keep lower extremities elevated as much as possible.  Increase water intake and limit sodium to no more than 2 g/day.  Recommend checking blood pressure regularly at home for at least a couple of weeks with a goal of blood pressures being 130/80 or less.  Reviewed emergency measures to seek urgent care for including chest pain, shortness of breath, orthopnea.  Follow-up: Return if symptoms fail to improve or worsen.  If edema not resolved in the next 1 to 2 weeks with conservative measures, advised to follow-up with PCP for further recommendations. ___________________________________________ Clearnce Sorrel, DNP, APRN, FNP-BC [facility]

## 2019-10-02 ENCOUNTER — Telehealth: Payer: Self-pay

## 2019-10-02 NOTE — Telephone Encounter (Signed)
Pt called stating since she quit taking the lovaza rx due to side effects. Does provider want to send in another medication or have pt make an appt to discuss other options? Pls advise, thanks.   Pls review tel encounter from 09/06/19.

## 2019-10-03 ENCOUNTER — Other Ambulatory Visit: Payer: Self-pay

## 2019-10-03 MED ORDER — GEMFIBROZIL 600 MG PO TABS: ORAL_TABLET | ORAL | 3 refills | Status: DC

## 2019-10-03 NOTE — Telephone Encounter (Signed)
Would suggest patient call her insurance company and see if Dalene Seltzer is covered?

## 2019-10-04 NOTE — Telephone Encounter (Signed)
Called patient and advised her to call insurance to see if medication is covered

## 2019-10-05 NOTE — Telephone Encounter (Signed)
Cynthia Adams states she would like to go back to the medication she was on and try a diet plan that is recommended by Dr Sheppard Coil.

## 2019-10-12 NOTE — Telephone Encounter (Signed)
To clarify:  Patient wants to stop taking the Lovaza?  Okay  Patient wants to get back on what she was on before, but she has been on several cholesterol medications, can she confirm which of the following she is talking about:  Gemfibrozil (should still be taking?  Over-the-counter omega-3  Pravastatin  Atorvastatin  Feel free to refill any of these per patient preference.

## 2019-10-12 NOTE — Telephone Encounter (Signed)
Re sending to provider

## 2019-10-15 NOTE — Telephone Encounter (Signed)
Patient contacted, patient was transferred to front desk to schedule appointment with provider to discuss further diet plan.

## 2019-10-22 ENCOUNTER — Ambulatory Visit (INDEPENDENT_AMBULATORY_CARE_PROVIDER_SITE_OTHER): Payer: Medicare Other | Admitting: Osteopathic Medicine

## 2019-10-22 ENCOUNTER — Other Ambulatory Visit: Payer: Self-pay

## 2019-10-22 ENCOUNTER — Encounter: Payer: Self-pay | Admitting: Osteopathic Medicine

## 2019-10-22 VITALS — BP 142/79 | HR 55 | Temp 98.0°F | Wt 147.0 lb

## 2019-10-22 DIAGNOSIS — E781 Pure hyperglyceridemia: Secondary | ICD-10-CM

## 2019-10-22 DIAGNOSIS — M5412 Radiculopathy, cervical region: Secondary | ICD-10-CM

## 2019-10-22 NOTE — Progress Notes (Signed)
Cynthia Adams is a 74 y.o. female who presents to  [place] at [facility]  today, 10/22/19, seeking care for the following: . Neck pain o Continues to bother her, would like to see neurosurgery again  . Cholesterol Rx  o Lovaza started 08/01/19 and caused diarrhea which improved w/ d/c this Rx, LE edema also developed about that time but did not go away. Saw Joy 09/10/19 for edema, given this and BP Joy suggested diuretic but pt opted for nonpharmacologic treatment w/ compression hose, elevation LE, limit sodium intake.      ASSESSMENT & PLAN with other pertinent history/findings:  The primary encounter diagnosis was Cervical radiculopathy. A diagnosis of Hypertriglyceridemia was also pertinent to this visit.   OK to leave off the [place] MR ordered for c-spine    Orders Placed This Encounter  Procedures  . MR Cervical Spine Wo Contrast  . Lipid panel  . Ambulatory referral to Neurosurgery    No orders of the defined types were placed in this encounter.        Follow-up instructions: Return for RECHECK PENDING MRI RESULTS / IF WORSE OR CHANGE.                                         BP (!) 142/79 (BP Location: Left Arm, Patient Position: Sitting)   Pulse (!) 55   Temp 98 F (36.7 C)   Wt 147 lb (66.7 kg)   BMI 28.71 kg/m   Current Meds  Medication Sig  . aspirin 81 MG tablet Take 81 mg by mouth daily.  . Cyanocobalamin (B-12 PO) Take by mouth.  . estradiol (ESTRACE) 0.5 MG tablet TAKE ONE-HALF TABLET BY  MOUTH DAILY  . fluticasone (FLONASE) 50 MCG/ACT nasal spray USE 2 SPRAYS IN EACH  NOSTRIL DAILY  . gabapentin (NEURONTIN) 600 MG tablet 1 po am, 1 po lunch and 2 po qhs  . gemfibrozil (LOPID) 600 MG tablet Take one tablet by mouth two times daily before a meal.  . HYDROcodone-acetaminophen (NORCO) 10-325 MG tablet Take 1 tablet by mouth 3 (three) times daily.  Marland Kitchen loratadine  (CLARITIN) 10 MG tablet Take 10 mg by mouth daily.  . metoprolol tartrate (LOPRESSOR) 50 MG tablet TAKE 1 TABLET BY MOUTH TWO  TIMES DAILY  . metroNIDAZOLE (METROGEL) 0.75 % gel Apply 1 application topically 2 (two) times daily.  . Multiple Vitamins-Minerals (MULTIVITAMIN WITH MINERALS) tablet Take 1 tablet by mouth daily.  Marland Kitchen omeprazole (PRILOSEC) 40 MG capsule Take 40 mg by mouth daily.  . Wheat Dextrin (BENEFIBER DRINK MIX PO) Take by mouth.    Results for orders placed or performed in visit on 10/22/19 (from the past 72 hour(s))  Lipid panel     Status: Abnormal   Collection Time: 10/22/19  5:05 PM  Result Value Ref Range   Cholesterol 207 (H) <200 mg/dL   HDL 41 (L) > OR = 50 mg/dL   Triglycerides 139 <150 mg/dL   LDL Cholesterol (Calc) 140 (H) mg/dL (calc)    Comment: Reference range: <100 . Desirable range <100 mg/dL for primary prevention;   <70 mg/dL for patients with CHD or diabetic patients  with > or = 2 CHD risk factors. Marland Kitchen LDL-C is now calculated using the Martin-Hopkins  calculation, which is a validated novel method providing  better accuracy than the Friedewald equation in  the  estimation of LDL-C.  Cresenciano Genre et al. Annamaria Helling. 6701;410(30): 2061-2068  (http://education.[employer].com/faq/FAQ164)    Total CHOL/HDL Ratio 5.0 (H) <5.0 (calc)   Non-HDL Cholesterol (Calc) 166 (H) <130 mg/dL (calc)    Comment: For patients with diabetes plus 1 major ASCVD risk  factor, treating to a non-HDL-C goal of <100 mg/dL  (LDL-C of <70 mg/dL) is considered a therapeutic  option.     No results found.  Depression screen [facility] 2/9 07/31/2019 06/13/2018 09/27/2017  Decreased Interest 1 0 0  Down, Depressed, Hopeless 1 0 0  PHQ - 2 Score 2 0 0  Altered sleeping 1 - -  Tired, decreased energy 1 - -  Change in appetite 2 - -  Feeling bad or failure about yourself  1 - -  Trouble concentrating 0 - -  Moving slowly or fidgety/restless 0 - -  Suicidal thoughts 0 - -  PHQ-9 Score 7  - -  Difficult doing work/chores Not difficult at all - -    GAD 7 : Generalized Anxiety Score 07/31/2019  Nervous, Anxious, on Edge 0  Control/stop worrying 1  Worry too much - different things 1  Trouble relaxing 0  Restless 0  Easily annoyed or irritable 1  Afraid - awful might happen 0  Total GAD 7 Score 3  Anxiety Difficulty Not difficult at all      All questions at time of visit were answered - patient instructed to contact office with any additional concerns or updates.  ER/RTC precautions were reviewed with the patient.  Please note: voice recognition software was used to produce this document, and typos may escape review. Please contact Dr. Sheppard Coil for any needed clarifications.   Total encounter time: 30 minutes.

## 2019-10-22 NOTE — Patient Instructions (Signed)
High Triglycerides Eating Plan Triglycerides are a type of fat in the blood. High levels of triglycerides can increase your risk of heart disease and stroke. If your triglyceride levels are high, choosing the right foods can help lower your triglycerides and keep your heart healthy. Work with your health care provider or a diet and nutrition specialist (dietitian) to develop an eating plan that is right for you. What are tips for following this plan? General guidelines   Lose weight, if you are overweight. For most people, losing 5-10 lbs (2-5 kg) helps lower triglyceride levels. A weight-loss plan may include. ? 30 minutes of exercise at least 5 days a week. ? Reducing the amount of calories, sugar, and fat you eat.  Eat a wide variety of fresh fruits, vegetables, and whole grains. These foods are high in fiber.  Eat foods that contain healthy fats, such as fatty fish, nuts, seeds, and olive oil.  Avoid foods that are high in added sugar, added salt (sodium), saturated fat, and trans fat.  Avoid low-fiber, refined carbohydrates such as white bread, crackers, noodles, and white rice.  Avoid foods with partially hydrogenated oils (trans fats), such as fried foods or stick margarine.  Limit alcohol intake to no more than 1 drink a day for nonpregnant women and 2 drinks a day for men. One drink equals 12 oz of beer, 5 oz of wine, or 1 oz of hard liquor. Your health care provider may recommend that you drink less depending on your overall health. Reading food labels  Check food labels for the amount of saturated fat. Choose foods with no or very little saturated fat.  Check food labels for the amount of trans fat. Choose foods with no trans fat.  Check food labels for the amount of cholesterol. Choose foods low in cholesterol. Ask your dietitian how much cholesterol you should have each day.  Check food labels for the amount of sodium. Choose foods with less than 140 milligrams (mg) per  serving. Shopping  Buy dairy products labeled as nonfat (skim) or low-fat (1%).  Avoid buying processed or prepackaged foods. These are often high in added sugar, sodium, and fat. Cooking  Choose healthy fats when cooking, such as olive oil or canola oil.  Cook foods using lower fat methods, such as baking, broiling, boiling, or grilling.  Make your own sauces, dressings, and marinades when possible, instead of buying them. Store-bought sauces, dressings, and marinades are often high in sodium and sugar. Meal planning  Eat more home-cooked food and less restaurant, buffet, and fast food.  Eat fatty fish at least 2 times each week. Examples of fatty fish include salmon, trout, mackerel, tuna, and herring.  If you eat whole eggs, do not eat more than 3 egg yolks per week. What foods are recommended? The items listed may not be a complete list. Talk with your dietitian about what dietary choices are best for you. Grains Whole wheat or whole grain breads, crackers, cereals, and pasta. Unsweetened oatmeal. Bulgur. Barley. Quinoa. Brown rice. Whole wheat flour tortillas. Vegetables Fresh or frozen vegetables. Low-sodium canned vegetables. Fruits All fresh, canned (in natural juice), or frozen fruits. Meats and other protein foods Skinless chicken or turkey. Ground chicken or turkey. Lean cuts of pork, trimmed of fat. Fish and seafood, especially salmon, trout, and herring. Egg whites. Dried beans, peas, or lentils. Unsalted nuts or seeds. Unsalted canned beans. Natural peanut or almond butter. Dairy Low-fat dairy products. Skim or low-fat (1%) milk. Reduced fat (  2%) and low-sodium cheese. Low-fat ricotta cheese. Low-fat cottage cheese. Plain, low-fat yogurt. Fats and oils Tub margarine without trans fats. Light or reduced-fat mayonnaise. Light or reduced-fat salad dressings. Avocado. Safflower, olive, sunflower, soybean, and canola oils. What foods are not recommended? The items listed  may not be a complete list. Talk with your dietitian about what dietary choices are best for you. Grains White bread. White (regular) pasta. White rice. Cornbread. Bagels. Pastries. Crackers that contain trans fat. Vegetables Creamed or fried vegetables. Vegetables in a cheese sauce. Fruits Sweetened dried fruit. Canned fruit in syrup. Fruit juice. Meats and other protein foods Fatty cuts of meat. Ribs. Chicken wings. Bacon. Sausage. Bologna. Salami. Chitterlings. Fatback. Hot dogs. Bratwurst. Packaged lunch meats. Dairy Whole or reduced-fat (2%) milk. Half-and-half. Cream cheese. Full-fat or sweetened yogurt. Full-fat cheese. Nondairy creamers. Whipped toppings. Processed cheese or cheese spreads. Cheese curds. Beverages Alcohol. Sweetened drinks, such as soda, lemonade, fruit drinks, or punches. Fats and oils Butter. Stick margarine. Lard. Shortening. Ghee. Bacon fat. Tropical oils, such as coconut, palm kernel, or palm oils. Sweets and desserts Corn syrup. Sugars. Honey. Molasses. Candy. Jam and jelly. Syrup. Sweetened cereals. Cookies. Pies. Cakes. Donuts. Muffins. Ice cream. Condiments Store-bought sauces, dressings, and marinades that are high in sugar, such as ketchup and barbecue sauce. Summary  High levels of triglycerides can increase the risk of heart disease and stroke. Choosing the right foods can help lower your triglycerides.  Eat plenty of fresh fruits, vegetables, and whole grains. Choose low-fat dairy and lean meats. Eat fatty fish at least twice a week.  Avoid processed and prepackaged foods with added sugar, sodium, saturated fat, and trans fat.  If you need suggestions or have questions about what types of food are good for you, talk with your health care provider or a dietitian. This information is not intended to replace advice given to you by your health care provider. Make sure you discuss any questions you have with your health care provider. Document Revised:  04/08/2017 Document Reviewed: 06/29/2016 Elsevier Patient Education  2020 Elsevier Inc.  

## 2019-10-23 LAB — LIPID PANEL
Cholesterol: 207 mg/dL — ABNORMAL HIGH (ref <200)
HDL: 41 mg/dL — ABNORMAL LOW (ref >=50)
LDL Cholesterol (Calc): 140 mg/dL (calc) — ABNORMAL HIGH
Non-HDL Cholesterol (Calc): 166 mg/dL (calc) — ABNORMAL HIGH (ref <130)
Total CHOL/HDL Ratio: 5 (calc) — ABNORMAL HIGH (ref <5.0)
Triglycerides: 139 mg/dL (ref <150)

## 2019-10-29 ENCOUNTER — Ambulatory Visit (INDEPENDENT_AMBULATORY_CARE_PROVIDER_SITE_OTHER): Payer: Medicare Other

## 2019-10-29 ENCOUNTER — Other Ambulatory Visit: Payer: Self-pay

## 2019-10-29 DIAGNOSIS — M5412 Radiculopathy, cervical region: Secondary | ICD-10-CM

## 2020-01-15 ENCOUNTER — Other Ambulatory Visit: Payer: Self-pay | Admitting: Family Medicine

## 2020-01-15 DIAGNOSIS — R232 Flushing: Secondary | ICD-10-CM

## 2020-01-31 ENCOUNTER — Ambulatory Visit: Payer: Medicare Other | Admitting: Osteopathic Medicine

## 2020-02-05 ENCOUNTER — Other Ambulatory Visit: Payer: Self-pay | Admitting: Family Medicine

## 2020-02-08 ENCOUNTER — Ambulatory Visit: Payer: Medicare Other | Admitting: Osteopathic Medicine

## 2020-02-11 HISTORY — PX: CERVICAL FUSION: SHX112

## 2020-02-13 ENCOUNTER — Encounter (HOSPITAL_COMMUNITY): Payer: Self-pay

## 2020-02-13 ENCOUNTER — Other Ambulatory Visit: Payer: Self-pay

## 2020-02-13 ENCOUNTER — Inpatient Hospital Stay (HOSPITAL_COMMUNITY)
Admission: EM | Admit: 2020-02-13 | Discharge: 2020-02-15 | DRG: 695 | Disposition: A | Discharge status: Home / Self Care | Payer: Medicare Other | Attending: Family Medicine | Admitting: Family Medicine

## 2020-02-13 DIAGNOSIS — Z961 Presence of intraocular lens: Secondary | ICD-10-CM | POA: Diagnosis present

## 2020-02-13 DIAGNOSIS — D72829 Elevated white blood cell count, unspecified: Secondary | ICD-10-CM | POA: Diagnosis present

## 2020-02-13 DIAGNOSIS — Z9049 Acquired absence of other specified parts of digestive tract: Secondary | ICD-10-CM

## 2020-02-13 DIAGNOSIS — Z8249 Family history of ischemic heart disease and other diseases of the circulatory system: Secondary | ICD-10-CM

## 2020-02-13 DIAGNOSIS — E785 Hyperlipidemia, unspecified: Secondary | ICD-10-CM | POA: Diagnosis present

## 2020-02-13 DIAGNOSIS — N179 Acute kidney failure, unspecified: Secondary | ICD-10-CM | POA: Diagnosis present

## 2020-02-13 DIAGNOSIS — R339 Retention of urine, unspecified: Secondary | ICD-10-CM | POA: Diagnosis not present

## 2020-02-13 DIAGNOSIS — Z882 Allergy status to sulfonamides status: Secondary | ICD-10-CM

## 2020-02-13 DIAGNOSIS — Z9841 Cataract extraction status, right eye: Secondary | ICD-10-CM

## 2020-02-13 DIAGNOSIS — K219 Gastro-esophageal reflux disease without esophagitis: Secondary | ICD-10-CM | POA: Diagnosis present

## 2020-02-13 DIAGNOSIS — Z9842 Cataract extraction status, left eye: Secondary | ICD-10-CM

## 2020-02-13 DIAGNOSIS — M797 Fibromyalgia: Secondary | ICD-10-CM | POA: Diagnosis present

## 2020-02-13 DIAGNOSIS — J302 Other seasonal allergic rhinitis: Secondary | ICD-10-CM | POA: Diagnosis present

## 2020-02-13 DIAGNOSIS — Z79899 Other long term (current) drug therapy: Secondary | ICD-10-CM

## 2020-02-13 DIAGNOSIS — Z881 Allergy status to other antibiotic agents status: Secondary | ICD-10-CM

## 2020-02-13 DIAGNOSIS — Z888 Allergy status to other drugs, medicaments and biological substances status: Secondary | ICD-10-CM

## 2020-02-13 DIAGNOSIS — M48061 Spinal stenosis, lumbar region without neurogenic claudication: Secondary | ICD-10-CM | POA: Diagnosis present

## 2020-02-13 DIAGNOSIS — G8918 Other acute postprocedural pain: Secondary | ICD-10-CM

## 2020-02-13 DIAGNOSIS — I10 Essential (primary) hypertension: Secondary | ICD-10-CM | POA: Diagnosis present

## 2020-02-13 DIAGNOSIS — I214 Non-ST elevation (NSTEMI) myocardial infarction: Secondary | ICD-10-CM | POA: Diagnosis present

## 2020-02-13 DIAGNOSIS — R7989 Other specified abnormal findings of blood chemistry: Secondary | ICD-10-CM | POA: Diagnosis present

## 2020-02-13 DIAGNOSIS — R778 Other specified abnormalities of plasma proteins: Secondary | ICD-10-CM

## 2020-02-13 DIAGNOSIS — Z20822 Contact with and (suspected) exposure to covid-19: Secondary | ICD-10-CM | POA: Diagnosis present

## 2020-02-13 DIAGNOSIS — Z7982 Long term (current) use of aspirin: Secondary | ICD-10-CM

## 2020-02-13 DIAGNOSIS — R9431 Abnormal electrocardiogram [ECG] [EKG]: Secondary | ICD-10-CM

## 2020-02-13 DIAGNOSIS — M1990 Unspecified osteoarthritis, unspecified site: Secondary | ICD-10-CM | POA: Diagnosis present

## 2020-02-13 DIAGNOSIS — R531 Weakness: Secondary | ICD-10-CM | POA: Diagnosis present

## 2020-02-13 DIAGNOSIS — R29898 Other symptoms and signs involving the musculoskeletal system: Secondary | ICD-10-CM | POA: Diagnosis present

## 2020-02-13 LAB — BASIC METABOLIC PANEL
Anion gap: 15 (ref 5–15)
BUN: 17 mg/dL (ref 8–23)
CO2: 24 mmol/L (ref 22–32)
Calcium: 9.6 mg/dL (ref 8.9–10.3)
Chloride: 101 mmol/L (ref 98–111)
Creatinine, Ser: 1.36 mg/dL — ABNORMAL HIGH (ref 0.44–1.00)
GFR calc non Af Amer: 39 mL/min — ABNORMAL LOW (ref >60)
Glucose, Bld: 135 mg/dL — ABNORMAL HIGH (ref 70–99)
Potassium: 3.4 mmol/L — ABNORMAL LOW (ref 3.5–5.1)
Sodium: 140 mmol/L (ref 135–145)

## 2020-02-13 LAB — CBC
HCT: 35.9 % — ABNORMAL LOW (ref 36.0–46.0)
Hemoglobin: 11.7 g/dL — ABNORMAL LOW (ref 12.0–15.0)
MCH: 30.5 pg (ref 26.0–34.0)
MCHC: 32.6 g/dL (ref 30.0–36.0)
MCV: 93.7 fL (ref 80.0–100.0)
Platelets: 231 10*3/uL (ref 150–400)
RBC: 3.83 MIL/uL — ABNORMAL LOW (ref 3.87–5.11)
RDW: 12.7 % (ref 11.5–15.5)
WBC: 16.2 10*3/uL — ABNORMAL HIGH (ref 4.0–10.5)
nRBC: 0 % (ref 0.0–0.2)

## 2020-02-13 LAB — CBG MONITORING, ED: Glucose-Capillary: 127 mg/dL — ABNORMAL HIGH (ref 70–99)

## 2020-02-13 MED ORDER — LACTATED RINGERS IV BOLUS
1000.0000 mL | Freq: Once | INTRAVENOUS | Status: AC
  Administered 2020-02-14: 1000 mL via INTRAVENOUS

## 2020-02-13 NOTE — ED Triage Notes (Signed)
Pt reports that she had a neck surgery on Monday, had a foley put in due to urinary retention and has it removed this morning. Has not voided since, removed at 9am, pt reports she has fell multiple times today and having neck pain.

## 2020-02-13 NOTE — ED Provider Notes (Signed)
[facility] EMERGENCY DEPARTMENT Provider Note   CSN: [id number] Arrival date & time: 02/13/20  2051     History Chief Complaint  Patient presents with  . Urinary Retention    Cynthia Adams is a 74 y.o. female.  Patient with recent cervical fusion performed by Dr. Ellene Route, presents to the ED with a chief complaint of urinary retention.  She states that she was seen in follow-up this morning and had her foley catheter removed.  She states that she has not been able to urinate all day.  She also states that she has been losing her balance since having had the surgery.  She complains of neck pain, but states that this is no better or worse since the surgery.  She denies any fevers or chills.  She denies any numbness in her lower extremities.  She is taking Hydrocodone 10-325 for pain.  The history is provided by the patient. No language interpreter was used.       Past Medical History:  Diagnosis Date  . Cataract 2017   Cataract surgery in 01-2016 and 02-2016  . GERD (gastroesophageal reflux disease)   . History of iron deficiency 06/13/2014  . Hyperlipidemia   . Hypertension   . Nerve pain   . Seasonal allergies     Patient Active Problem List   Diagnosis Date Noted  . Diverticulosis 04/17/2018  . Seborrheic dermatitis 09/20/2017  . Dysphagia 06/02/2016  . Cyst of left breast 06/13/2014  . Spinal stenosis of lumbar region 06/13/2014  . Bilateral renal cysts 06/13/2014  . History of iron deficiency 06/13/2014  . Essential hypertension 05/13/2014  . Hyperlipidemia 05/13/2014  . Osteopenia 05/13/2014  . GERD (gastroesophageal reflux disease) 05/13/2014  . Prediabetes 05/13/2014    Past Surgical History:  Procedure Laterality Date  . ABDOMINAL HYSTERECTOMY    . BREAST BIOPSY    . BREAST CYST EXCISION    . CATARACT EXTRACTION W/ INTRAOCULAR LENS  IMPLANT, BILATERAL Bilateral 01-2016, 02-2016  . CATARACT EXTRACTION W/ INTRAOCULAR LENS & ANTERIOR  VITRECTOMY Bilateral 01-29-2016, 02-19-2016   Right eye in 01-2016 and Left eye 02-2016  . CESAREAN SECTION    . CESAREAN SECTION    . CHOLECYSTECTOMY    . LUMBAR DISC SURGERY    . TONSILLECTOMY       OB History   No obstetric history on file.     Family History  Problem Relation Age of Onset  . Cancer Mother        breast  . Dementia Mother   . Hypertension Mother   . Hyperlipidemia Mother   . Breast cancer Mother   . Dementia Sister   . Hypertension Sister   . Hyperlipidemia Sister   . Hyperlipidemia Brother   . Hypertension Brother   . Hypertension Son   . Dementia Maternal Aunt   . Breast cancer Maternal Aunt   . Cancer Paternal Uncle   . Alcohol abuse Paternal Uncle   . Stroke Maternal Grandmother   . Heart disease Paternal Grandmother   . Hyperlipidemia Sister   . Hypertension Sister   . Hyperlipidemia Sister   . Hypertension Sister   . Dementia Maternal Aunt   . Breast cancer Maternal Aunt   . Dementia Maternal Aunt   . Breast cancer Maternal Aunt   . Alcohol abuse Paternal Uncle     Social History   Tobacco Use  . Smoking status: Never Smoker  . Smokeless tobacco: Never Used  Vaping Use  .  Vaping Use: Never used  Substance Use Topics  . Alcohol use: Yes    Comment: may have a glass of wine but very, very seldom.  . Drug use: No    Home Medications Prior to Admission medications   Medication Sig Start Date End Date Taking? Authorizing Provider  aspirin 81 MG tablet Take 81 mg by mouth daily.    [provider]  Cyanocobalamin (B-12 PO) Take by mouth.    [provider]  estradiol (ESTRACE) 0.5 MG tablet TAKE ONE-HALF TABLET BY  MOUTH DAILY 01/22/20   Emeterio Reeve, DO  fluticasone [facility]) 50 MCG/ACT nasal spray USE 2 SPRAYS IN [facility]  NOSTRIL DAILY 01/03/19   Gregor Hams, MD  gabapentin (NEURONTIN) 600 MG tablet 1 po am, 1 po lunch and 2 po qhs 07/10/19   Silverio Decamp, MD  gemfibrozil (LOPID) 600 MG tablet Take  one tablet by mouth two times daily before a meal. 10/03/19   Emeterio Reeve, DO  HYDROcodone-acetaminophen (NORCO) 10-325 MG tablet Take 1 tablet by mouth 3 (three) times daily. 12/25/15   [provider]  loratadine (CLARITIN) 10 MG tablet Take 10 mg by mouth daily.    [provider]  metoprolol tartrate (LOPRESSOR) 50 MG tablet TAKE 1 TABLET BY MOUTH  TWICE DAILY 02/05/20   Emeterio Reeve, DO  metroNIDAZOLE (METROGEL) 0.75 % gel Apply 1 application topically 2 (two) times daily. 08/02/19   Samuel Bouche, NP  Multiple Vitamins-Minerals (MULTIVITAMIN WITH MINERALS) tablet Take 1 tablet by mouth daily.    [provider]  omeprazole (PRILOSEC) 40 MG capsule Take 40 mg by mouth daily.    [provider]  Wheat Dextrin (BENEFIBER DRINK MIX PO) Take by mouth.    [provider]    Allergies    Lovaza [omega-3-acid ethyl esters], Minocycline, Pantoprazole, Sulfa antibiotics, Amoxicillin-pot clavulanate, Lipitor [atorvastatin], and Pravastatin  Review of Systems   Review of Systems  All other systems reviewed and are negative.   Physical Exam Updated Vital Signs BP 124/75   Pulse (!) 104   Temp 98.4 F (36.9 C) (Oral)   Resp (!) 23   SpO2 97%   Physical Exam Vitals and nursing note reviewed.  Constitutional:      General: She is not in acute distress.    Appearance: She is well-developed.  HENT:     Head: Normocephalic and atraumatic.  Eyes:     Conjunctiva/sclera: Conjunctivae normal.  Cardiovascular:     Rate and Rhythm: Normal rate and regular rhythm.     Heart sounds: No murmur heard.   Pulmonary:     Effort: Pulmonary effort is normal. No respiratory distress.     Breath sounds: Normal breath sounds.  Abdominal:     Palpations: Abdomen is soft.     Tenderness: There is no abdominal tenderness.  Musculoskeletal:     Cervical back: Neck supple.     Comments: 5/5 ROM and strength in lower extremities   Skin:    General:  Skin is warm and dry.  Neurological:     Mental Status: She is alert and oriented to person, place, and time.     Comments: Light touch sensation in BLE intact 2+ patella reflexes bilaterally  Psychiatric:        Mood and Affect: Mood normal.        Behavior: Behavior normal.     ED Results / Procedures / Treatments   Labs (all labs ordered are listed, but only  abnormal results are displayed) Labs Reviewed  BASIC METABOLIC PANEL - Abnormal; Notable for the following components:      Result Value   Potassium 3.4 (*)    Glucose, Bld 135 (*)    Creatinine, Ser 1.36 (*)    GFR calc non Af Amer 39 (*)    All other components within normal limits  CBC - Abnormal; Notable for the following components:   WBC 16.2 (*)    RBC 3.83 (*)    Hemoglobin 11.7 (*)    HCT 35.9 (*)    All other components within normal limits  URINALYSIS, ROUTINE W REFLEX MICROSCOPIC  CBG MONITORING, ED    EKG EKG Interpretation  Date/Time:  Wednesday February 13 2020 21:19:39 EDT Ventricular Rate:  110 PR Interval:  168 QRS Duration: 68 QT Interval:  354 QTC Calculation: 479 R Axis:   18 Text Interpretation: Sinus tachycardia Nonspecific ST and T wave abnormality  new since 2008 Confirmed by Blanchie Dessert [phone]) on 02/13/2020 10:58:31 PM   Radiology DG Cervical Spine 1 View  Result Date: 02/14/2020 CLINICAL DATA:  Recent surgery, neck pain EXAM: DG CERVICAL SPINE - 1 VIEW COMPARISON:  February 11, 2020 FINDINGS: The patient is status ACDF at C4 through C7. No periprosthetic fracture is identified. Prevertebral soft tissue swelling and subcutaneous emphysema seen from C4 through C7. IMPRESSION: Status post ACDF from C4 through C7 without acute complication. Prevertebral soft tissue swelling and subcutaneous emphysema, likely postsurgical. Electronically Signed   By: Prudencio Pair M.D.   On: 02/14/2020 00:42   DG Chest Port 1 View  Result Date: 02/14/2020 CLINICAL DATA:  Weakness EXAM: PORTABLE CHEST  1 VIEW COMPARISON:  July 15, 2006 FINDINGS: The heart size and mediastinal contours are within normal limits. Both lungs are clear. The visualized skeletal structures are unremarkable. Aortic calcifications are noted. IMPRESSION: No active disease. Electronically Signed   By: Constance Holster M.D.   On: 02/14/2020 01:54    Procedures .Critical Care Performed by: Montine Circle, PA-C Authorized by: Montine Circle, PA-C   Critical care provider statement:    Critical care time (minutes):  10   Critical care was necessary to treat or prevent imminent or life-threatening deterioration of the following conditions:  Circulatory failure   Critical care was time spent personally by me on the following activities:  Discussions with consultants, evaluation of patient's response to treatment, examination of patient, ordering and performing treatments and interventions, ordering and review of laboratory studies, ordering and review of radiographic studies, pulse oximetry, re-evaluation of patient's condition, obtaining history from patient or surrogate and review of old charts   (including critical care time)  Medications Ordered in ED Medications - No data to display  ED Course  I have reviewed the triage vital signs and the nursing notes.  Pertinent labs & imaging results that were available during my care of the patient were reviewed by me and considered in my medical decision making (see chart for details).    MDM Rules/Calculators/A&P                          This patient complains of urinary retention.  She just had cervical spine fusion by Dr. Ellene Route on Monday of this week.  She also states that she has had some lightheadedness and that her legs have given out from underneath her a few times.  She has fallen.  She denies being in any pain.  She denies chest  pain or shortness of breath.  Pertinent Labs I ordered, reviewed, and interpreted labs, which included CBC notable for  leukocytosis to 16.2, creatinine is 1.36, but GFR is 39, troponin ordered due to ST/T wave changes when compared to prior EKG, and is noted to be 4665.  Patient is not having chest pain or shortness of breath.  She would be high risk for PE given her recent surgery, but also D-dimer will be less helpful given the surgery.  She cannot get CT angio due to GFR, and will need VQ scan.  Imaging Interpretation I ordered imaging studies which included chest x-ray and cervical spine lateral x-ray.  I independently visualized and interpreted the no active disease on the chest x-ray, no apparent dysfunction of cervical spine hardware.   Medications I ordered medication fentanyl for pain.  Sources Additional history obtained from husband. Previous records obtained and reviewed no records available regarding cervical spine fusion in epic.   Critical Interventions  Continuous cardiac monitoring for elevated troponin.  Reassessments After the interventions stated above, I reevaluated the patient and found still having no chest pain or shortness of breath.  She does have neck pain from her surgery.  She has not urinated.  She wanted to try to urinate on her own after some fluids, but has been unsuccessful.  We will place Foley catheter prior to admission.  Consultants Neurosurgery - [place], [place] -who recommends checking lateral films of the neck to evaluate the hardware, but states that her recent surgery is unlikely to cause for lower extremity weakness.  Doubts infection given the short amount of time since the surgery.  Cardiology- Dr. Paticia Stack -who recommends admitting the patient for observation and trending of troponins.  Recommends PE study, but patient cannot get CT, will need VQ scan.  Recommends hospitalist admission and reconsulting cardiology if troponins remain elevated or increase.  Hospitalist - Dr. Tobie Poet - who is greatly appreciated for admitting this patient.  Plan Admit to  hospital.    Final Clinical Impression(s) / ED Diagnoses Final diagnoses:  Post-op pain  Elevated troponin  Abnormal EKG  Urinary retention    Rx / DC Orders ED Discharge Orders    None       Montine Circle, PA-C 02/14/20 1610    Merryl Hacker, MD 02/14/20 [phone]

## 2020-02-13 NOTE — ED Notes (Signed)
78ml of urine via bladder scan.

## 2020-02-14 ENCOUNTER — Encounter (HOSPITAL_COMMUNITY): Payer: Self-pay | Admitting: Internal Medicine

## 2020-02-14 ENCOUNTER — Other Ambulatory Visit: Payer: Self-pay

## 2020-02-14 ENCOUNTER — Inpatient Hospital Stay (HOSPITAL_COMMUNITY): Payer: Medicare Other

## 2020-02-14 ENCOUNTER — Emergency Department (HOSPITAL_COMMUNITY): Payer: Medicare Other

## 2020-02-14 DIAGNOSIS — N179 Acute kidney failure, unspecified: Secondary | ICD-10-CM | POA: Diagnosis present

## 2020-02-14 DIAGNOSIS — Z20822 Contact with and (suspected) exposure to covid-19: Secondary | ICD-10-CM | POA: Diagnosis present

## 2020-02-14 DIAGNOSIS — R339 Retention of urine, unspecified: Secondary | ICD-10-CM | POA: Diagnosis present

## 2020-02-14 DIAGNOSIS — I214 Non-ST elevation (NSTEMI) myocardial infarction: Secondary | ICD-10-CM | POA: Diagnosis present

## 2020-02-14 DIAGNOSIS — D72829 Elevated white blood cell count, unspecified: Secondary | ICD-10-CM | POA: Diagnosis present

## 2020-02-14 DIAGNOSIS — E785 Hyperlipidemia, unspecified: Secondary | ICD-10-CM | POA: Diagnosis present

## 2020-02-14 DIAGNOSIS — M797 Fibromyalgia: Secondary | ICD-10-CM | POA: Diagnosis present

## 2020-02-14 DIAGNOSIS — Z9049 Acquired absence of other specified parts of digestive tract: Secondary | ICD-10-CM | POA: Diagnosis not present

## 2020-02-14 DIAGNOSIS — K219 Gastro-esophageal reflux disease without esophagitis: Secondary | ICD-10-CM | POA: Diagnosis present

## 2020-02-14 DIAGNOSIS — M1990 Unspecified osteoarthritis, unspecified site: Secondary | ICD-10-CM | POA: Diagnosis present

## 2020-02-14 DIAGNOSIS — D72828 Other elevated white blood cell count: Secondary | ICD-10-CM

## 2020-02-14 DIAGNOSIS — Z7982 Long term (current) use of aspirin: Secondary | ICD-10-CM | POA: Diagnosis not present

## 2020-02-14 DIAGNOSIS — R778 Other specified abnormalities of plasma proteins: Secondary | ICD-10-CM | POA: Diagnosis not present

## 2020-02-14 DIAGNOSIS — Z882 Allergy status to sulfonamides status: Secondary | ICD-10-CM | POA: Diagnosis not present

## 2020-02-14 DIAGNOSIS — J302 Other seasonal allergic rhinitis: Secondary | ICD-10-CM | POA: Diagnosis present

## 2020-02-14 DIAGNOSIS — Z79899 Other long term (current) drug therapy: Secondary | ICD-10-CM | POA: Diagnosis not present

## 2020-02-14 DIAGNOSIS — Z881 Allergy status to other antibiotic agents status: Secondary | ICD-10-CM | POA: Diagnosis not present

## 2020-02-14 DIAGNOSIS — Z9842 Cataract extraction status, left eye: Secondary | ICD-10-CM | POA: Diagnosis not present

## 2020-02-14 DIAGNOSIS — Z9841 Cataract extraction status, right eye: Secondary | ICD-10-CM | POA: Diagnosis not present

## 2020-02-14 DIAGNOSIS — Z888 Allergy status to other drugs, medicaments and biological substances status: Secondary | ICD-10-CM | POA: Diagnosis not present

## 2020-02-14 DIAGNOSIS — I10 Essential (primary) hypertension: Secondary | ICD-10-CM | POA: Diagnosis present

## 2020-02-14 DIAGNOSIS — R29898 Other symptoms and signs involving the musculoskeletal system: Secondary | ICD-10-CM | POA: Diagnosis present

## 2020-02-14 DIAGNOSIS — R7989 Other specified abnormal findings of blood chemistry: Secondary | ICD-10-CM | POA: Diagnosis present

## 2020-02-14 DIAGNOSIS — M48061 Spinal stenosis, lumbar region without neurogenic claudication: Secondary | ICD-10-CM | POA: Diagnosis present

## 2020-02-14 DIAGNOSIS — Z8249 Family history of ischemic heart disease and other diseases of the circulatory system: Secondary | ICD-10-CM | POA: Diagnosis not present

## 2020-02-14 DIAGNOSIS — R531 Weakness: Secondary | ICD-10-CM | POA: Diagnosis present

## 2020-02-14 DIAGNOSIS — Z961 Presence of intraocular lens: Secondary | ICD-10-CM | POA: Diagnosis present

## 2020-02-14 LAB — CBC
HCT: 33.1 % — ABNORMAL LOW (ref 36.0–46.0)
Hemoglobin: 11.1 g/dL — ABNORMAL LOW (ref 12.0–15.0)
MCH: 31.5 pg (ref 26.0–34.0)
MCHC: 33.5 g/dL (ref 30.0–36.0)
MCV: 94 fL (ref 80.0–100.0)
Platelets: 217 10*3/uL (ref 150–400)
RBC: 3.52 MIL/uL — ABNORMAL LOW (ref 3.87–5.11)
RDW: 12.7 % (ref 11.5–15.5)
WBC: 14.9 10*3/uL — ABNORMAL HIGH (ref 4.0–10.5)
nRBC: 0 % (ref 0.0–0.2)

## 2020-02-14 LAB — URINALYSIS, ROUTINE W REFLEX MICROSCOPIC
Bilirubin Urine: NEGATIVE
Glucose, UA: NEGATIVE mg/dL
Ketones, ur: 5 mg/dL — AB
Nitrite: NEGATIVE
Protein, ur: 100 mg/dL — AB
Specific Gravity, Urine: 1.023 (ref 1.005–1.030)
pH: 5 (ref 5.0–8.0)

## 2020-02-14 LAB — BASIC METABOLIC PANEL
Anion gap: 17 — ABNORMAL HIGH (ref 5–15)
BUN: 17 mg/dL (ref 8–23)
CO2: 20 mmol/L — ABNORMAL LOW (ref 22–32)
Calcium: 9 mg/dL (ref 8.9–10.3)
Chloride: 104 mmol/L (ref 98–111)
Creatinine, Ser: 0.95 mg/dL (ref 0.44–1.00)
GFR calc non Af Amer: 59 mL/min — ABNORMAL LOW (ref >60)
Glucose, Bld: 123 mg/dL — ABNORMAL HIGH (ref 70–99)
Potassium: 3.2 mmol/L — ABNORMAL LOW (ref 3.5–5.1)
Sodium: 141 mmol/L (ref 135–145)

## 2020-02-14 LAB — URINE CULTURE

## 2020-02-14 LAB — ECHOCARDIOGRAM COMPLETE
Area-P 1/2: 2.14 cm2
Height: 60 in
S' Lateral: 2.2 cm
Weight: 2203.2 oz

## 2020-02-14 LAB — RESPIRATORY PANEL BY RT PCR (FLU A&B, COVID)
Influenza A by PCR: NEGATIVE
Influenza B by PCR: NEGATIVE
SARS Coronavirus 2 by RT PCR: NEGATIVE

## 2020-02-14 LAB — TROPONIN I (HIGH SENSITIVITY)
Troponin I (High Sensitivity): 4665 ng/L (ref <18)
Troponin I (High Sensitivity): 3418 ng/L (ref <18)

## 2020-02-14 MED ORDER — GABAPENTIN 300 MG PO CAPS
600.0000 mg | ORAL_CAPSULE | Freq: Two times a day (BID) | ORAL | Status: DC
  Administered 2020-02-14 – 2020-02-15 (×3): 600 mg via ORAL
  Filled 2020-02-14 (×2): qty 2

## 2020-02-14 MED ORDER — CYCLOBENZAPRINE HCL 10 MG PO TABS
10.0000 mg | ORAL_TABLET | Freq: Three times a day (TID) | ORAL | Status: DC
  Administered 2020-02-14 – 2020-02-15 (×3): 10 mg via ORAL
  Filled 2020-02-14 (×4): qty 1

## 2020-02-14 MED ORDER — CHLORHEXIDINE GLUCONATE CLOTH 2 % EX PADS
6.0000 | MEDICATED_PAD | Freq: Every day | CUTANEOUS | Status: DC
  Administered 2020-02-15: 6 via TOPICAL

## 2020-02-14 MED ORDER — OXYCODONE-ACETAMINOPHEN 5-325 MG PO TABS
1.0000 | ORAL_TABLET | ORAL | Status: DC | PRN
  Administered 2020-02-14: 1 via ORAL
  Filled 2020-02-14: qty 1

## 2020-02-14 MED ORDER — ENSURE ENLIVE PO LIQD
237.0000 mL | Freq: Two times a day (BID) | ORAL | Status: DC
  Administered 2020-02-14 – 2020-02-15 (×2): 237 mL via ORAL

## 2020-02-14 MED ORDER — ENOXAPARIN SODIUM 40 MG/0.4ML ~~LOC~~ SOLN
40.0000 mg | SUBCUTANEOUS | Status: DC
  Administered 2020-02-14 – 2020-02-15 (×3): 40 mg via SUBCUTANEOUS
  Filled 2020-02-14 (×3): qty 0.4

## 2020-02-14 MED ORDER — FLUTICASONE PROPIONATE 50 MCG/ACT NA SUSP
2.0000 | Freq: Every day | NASAL | Status: DC
  Administered 2020-02-15: 2 via NASAL
  Filled 2020-02-14: qty 16

## 2020-02-14 MED ORDER — METOPROLOL TARTRATE 50 MG PO TABS
50.0000 mg | ORAL_TABLET | Freq: Two times a day (BID) | ORAL | Status: DC
  Administered 2020-02-14 – 2020-02-15 (×3): 50 mg via ORAL
  Filled 2020-02-14: qty 2
  Filled 2020-02-14 (×2): qty 1

## 2020-02-14 MED ORDER — FENTANYL CITRATE (PF) 100 MCG/2ML IJ SOLN
50.0000 ug | Freq: Once | INTRAMUSCULAR | Status: AC
  Administered 2020-02-14: 50 ug via INTRAVENOUS
  Filled 2020-02-14: qty 2

## 2020-02-14 MED ORDER — GABAPENTIN 400 MG PO CAPS
1200.0000 mg | ORAL_CAPSULE | Freq: Every day | ORAL | Status: DC
  Administered 2020-02-14: 1200 mg via ORAL
  Filled 2020-02-14: qty 3
  Filled 2020-02-14: qty 4

## 2020-02-14 MED ORDER — GABAPENTIN 600 MG PO TABS: 600.0000 mg | ORAL_TABLET | ORAL | Status: DC

## 2020-02-14 MED ORDER — OXYCODONE HCL 5 MG PO TABS
5.0000 mg | ORAL_TABLET | ORAL | Status: DC | PRN
  Administered 2020-02-14 – 2020-02-15 (×2): 5 mg via ORAL
  Filled 2020-02-14 (×3): qty 1

## 2020-02-14 MED ORDER — ASPIRIN 81 MG PO CHEW
81.0000 mg | CHEWABLE_TABLET | Freq: Every day | ORAL | Status: DC
  Administered 2020-02-14 – 2020-02-15 (×2): 81 mg via ORAL
  Filled 2020-02-14 (×2): qty 1

## 2020-02-14 MED ORDER — IOHEXOL 350 MG/ML SOLN
100.0000 mL | Freq: Once | INTRAVENOUS | Status: AC | PRN
  Administered 2020-02-14: 65 mL via INTRAVENOUS

## 2020-02-14 NOTE — H&P (Signed)
History and Physical   Cynthia Adams YQM:[id number] DOB: 03-02-1946 DOA: 02/13/2020  PCP: Emeterio Reeve, DO  Patient coming from: home  I have personally briefly reviewed patient's old medical records in [place] EMR  Chief Concern: urinary retention  HPI: Cynthia Adams is a 74 y.o. female with medical history of neck pain s/p cervical fusion of C4 to C7 on 02/11/20, hypertension, hyperlipidemia, GERD presented to ED with chief concern of urinary retention since discharge from cervical surgery on 02/11/20.   HPI obtained from patient and spouse who is at bedside. She reports that prior to surgery, she was able to urinate on her own without difficulty. Per patient, she was discharge home with foley catheter. She also notes bilateral lower extremity weakness and difficulty walking since discharge from hospital. She denies trying anything to elevate the urinary retention. She reports tolerating PO intake without difficulty or GI discomfort.   ROS was negative for headache, vision changes, speech changes, nausea, vomiting, diarrhea, fever, chest pain, shortness of breath, upper extremity complaints, abdominal pain, dysuria, hematuria, slurring of speech, bleeding.   ED Course: Discussed with ED provider, patient given fentanyl in the ED for neck pain and LR bolus for urinary retention. Cervical xray ordered showing prevertebral soft tissue swelling and subcutaneous emphysema from C4 to C7. EKG was obtained in the ED that showed ST-T depression that improved with repeat EKG. Troponin HS was ordered and found to be elevated at >4000. ED provider called cardiology. ED vitals were afebrile, HR 85-106, RR 16, BP 133/60, and spO2 at 96% on room air.   She will be admitted to inpatient.   Review of Systems: As per HPI otherwise 10 point review of systems negative.   Past Medical History:  Diagnosis Date  . Cataract 2017   Cataract surgery in 01-2016 and 02-2016  . GERD (gastroesophageal  reflux disease)   . History of iron deficiency 06/13/2014  . Hyperlipidemia   . Hypertension   . Nerve pain   . Seasonal allergies    Past Surgical History:  Procedure Laterality Date  . ABDOMINAL HYSTERECTOMY    . BREAST BIOPSY    . BREAST CYST EXCISION    . CATARACT EXTRACTION W/ INTRAOCULAR LENS  IMPLANT, BILATERAL Bilateral 01-2016, 02-2016  . CATARACT EXTRACTION W/ INTRAOCULAR LENS & ANTERIOR VITRECTOMY Bilateral 01-29-2016, 02-19-2016   Right eye in 01-2016 and Left eye 02-2016  . CESAREAN SECTION    . CESAREAN SECTION    . CHOLECYSTECTOMY    . LUMBAR DISC SURGERY    . TONSILLECTOMY     Social History:  reports that she has never smoked. She has never used smokeless tobacco. She reports previous alcohol use. She reports that she does not use drugs.  Allergies  Allergen Reactions  . Lovaza [Omega-3-Acid Ethyl Esters] Diarrhea  . Minocycline Diarrhea, Nausea And Vomiting and Other (See Comments)    Stomach pain    . Pantoprazole Swelling  . Sulfa Antibiotics Swelling  . Amoxicillin-Pot Clavulanate Diarrhea  . Lipitor [Atorvastatin] Other (See Comments)    Diarrhea and insomnia  . Pravastatin Diarrhea   Family History  Problem Relation Age of Onset  . Cancer Mother        breast  . Dementia Mother   . Hypertension Mother   . Hyperlipidemia Mother   . Breast cancer Mother   . Dementia Sister   . Hypertension Sister   . Hyperlipidemia Sister   . Hyperlipidemia Brother   . Hypertension Brother   .  Hypertension Son   . Dementia Maternal Aunt   . Breast cancer Maternal Aunt   . Cancer Paternal Uncle   . Alcohol abuse Paternal Uncle   . Stroke Maternal Grandmother   . Heart disease Paternal Grandmother   . Hyperlipidemia Sister   . Hypertension Sister   . Hyperlipidemia Sister   . Hypertension Sister   . Dementia Maternal Aunt   . Breast cancer Maternal Aunt   . Dementia Maternal Aunt   . Breast cancer Maternal Aunt   . Alcohol abuse Paternal Uncle     Family history: Family history reviewed   Prior to Admission medications   Medication Sig Start Date End Date Taking? Authorizing Provider  aspirin 81 MG tablet Take 81 mg by mouth daily.    [provider]  Cyanocobalamin (B-12 PO) Take by mouth.    [provider]  estradiol (ESTRACE) 0.5 MG tablet TAKE ONE-HALF TABLET BY  MOUTH DAILY 01/22/20   Emeterio Reeve, DO  fluticasone [facility]) 50 MCG/ACT nasal spray USE 2 SPRAYS IN [facility]  NOSTRIL DAILY 01/03/19   Gregor Hams, MD  gabapentin (NEURONTIN) 600 MG tablet 1 po am, 1 po lunch and 2 po qhs 07/10/19   Silverio Decamp, MD  gemfibrozil (LOPID) 600 MG tablet Take one tablet by mouth two times daily before a meal. 10/03/19   Emeterio Reeve, DO  HYDROcodone-acetaminophen (NORCO) 10-325 MG tablet Take 1 tablet by mouth 3 (three) times daily. 12/25/15   [provider]  loratadine (CLARITIN) 10 MG tablet Take 10 mg by mouth daily.    [provider]  metoprolol tartrate (LOPRESSOR) 50 MG tablet TAKE 1 TABLET BY MOUTH  TWICE DAILY 02/05/20   Emeterio Reeve, DO  metroNIDAZOLE (METROGEL) 0.75 % gel Apply 1 application topically 2 (two) times daily. 08/02/19   Samuel Bouche, NP  Multiple Vitamins-Minerals (MULTIVITAMIN WITH MINERALS) tablet Take 1 tablet by mouth daily.    [provider]  omeprazole (PRILOSEC) 40 MG capsule Take 40 mg by mouth daily.    [provider]  Wheat Dextrin (BENEFIBER DRINK MIX PO) Take by mouth.    [provider]   Physical Exam: Vitals:   02/14/20 0200 02/14/20 0215 02/14/20 0230 02/14/20 0245  BP: 138/75 132/73 134/75 133/60  Pulse: 88 85 88 91  Resp: 17 16 (!) 21 16  Temp:      TempSrc:      SpO2: 100% 97% 98% 96%  Weight:      Height:       Constitutional: NAD, calm, comfortable Eyes: PERRL, lids and conjunctivae normal ENMT:  Mucous membranes are moist. Posterior pharynx clear of any exudate or lesions.Normal dentition.  Neck:  Cervical collar in place. Limited ROM Respiratory: clear to auscultation bilaterally, no wheezing, no crackles on anterior chest wall examination. Normal respiratory effort. No accessory muscle use.  Cardiovascular: Regular rate and rhythm, no murmurs / rubs / gallops. No extremity edema. 2+ pedal pulses. No carotid bruits.  Abdomen: abdominal obesity, no tenderness, no masses palpated. No hepatosplenomegaly.   Musculoskeletal: no clubbing / cyanosis. No joint deformity upper and lower extremities. Good ROM, no contractures. Normal muscle tone.  Skin: no rashes, lesions, ulcers. No induration Neurologic: CN 2-12 grossly intact. Sensation intact, DTR normal. Strength 5/5 in all 4.  Psychiatric: Normal judgment and insight. Alert and oriented x 3.   Labs on Admission: I have personally reviewed following labs and imaging studies  CBC: Recent Labs  Lab 02/13/20 2130  WBC 16.2*  HGB 11.7*  HCT 35.9*  MCV 93.7  PLT 453   Basic Metabolic Panel: Recent Labs  Lab 02/13/20 2130  NA 140  K 3.4*  CL 101  CO2 24  GLUCOSE 135*  BUN 17  CREATININE 1.36*  CALCIUM 9.6   GFR: Estimated Creatinine Clearance: 31.2 mL/min (A) (by C-G formula based on SCr of 1.36 mg/dL (H)). Liver Function Tests: No results for input(s): AST, ALT, ALKPHOS, BILITOT, PROT, ALBUMIN in the last 168 hours. No results for input(s): LIPASE, AMYLASE in the last 168 hours. No results for input(s): AMMONIA in the last 168 hours. Coagulation Profile: No results for input(s): INR, PROTIME in the last 168 hours. Cardiac Enzymes: No results for input(s): CKTOTAL, CKMB, CKMBINDEX, TROPONINI in the last 168 hours. BNP (last 3 results) No results for input(s): PROBNP in the last 8760 hours. HbA1C: No results for input(s): HGBA1C in the last 72 hours. CBG: Recent Labs  Lab 02/13/20 2343  GLUCAP 127*   Lipid Profile: No results for input(s): CHOL, HDL, LDLCALC, TRIG, CHOLHDL, LDLDIRECT in the last 72 hours. Thyroid  Function Tests: No results for input(s): TSH, T4TOTAL, FREET4, T3FREE, THYROIDAB in the last 72 hours. Anemia Panel: No results for input(s): VITAMINB12, FOLATE, FERRITIN, TIBC, IRON, RETICCTPCT in the last 72 hours. Urine analysis:    Component Value Date/Time   BILIRUBINUR Small 12/26/2018 0936   PROTEINUR Positive (A) 12/26/2018 0936   UROBILINOGEN 0.2 12/26/2018 0936   NITRITE Negative 12/26/2018 0936   LEUKOCYTESUR Small (1+) (A) 12/26/2018 0936   Radiological Exams on Admission: Personally reviewed and I agree with radiologist reading as below.  DG Cervical Spine 1 View  Result Date: 02/14/2020 CLINICAL DATA:  Recent surgery, neck pain EXAM: DG CERVICAL SPINE - 1 VIEW COMPARISON:  February 11, 2020 FINDINGS: The patient is status ACDF at C4 through C7. No periprosthetic fracture is identified. Prevertebral soft tissue swelling and subcutaneous emphysema seen from C4 through C7. IMPRESSION: Status post ACDF from C4 through C7 without acute complication. Prevertebral soft tissue swelling and subcutaneous emphysema, likely postsurgical. Electronically Signed   By: Prudencio Pair M.D.   On: 02/14/2020 00:42   DG Chest Port 1 View  Result Date: 02/14/2020 CLINICAL DATA:  Weakness EXAM: PORTABLE CHEST 1 VIEW COMPARISON:  July 15, 2006 FINDINGS: The heart size and mediastinal contours are within normal limits. Both lungs are clear. The visualized skeletal structures are unremarkable. Aortic calcifications are noted. IMPRESSION: No active disease. Electronically Signed   By: Constance Holster M.D.   On: 02/14/2020 01:54   EKG: Independently reviewed, showing ST-T wave depression and sinus rhythm  Assessment/Plan  Ms. Maybelle Depaoli is a 74 year old female, with cervical radiculopathy s/p ACDF on 02/11/20, presenting with post op urinary retention found to have AKI.   Principal Problem:   Urinary retention Active Problems:   Essential hypertension   GERD (gastroesophageal reflux disease)    Troponin level elevated   AKI (acute kidney injury) (HCC)   Weakness of both legs   Leukocytosis  # Urinary retention - patient received trial of 1 L LR bolus  - Patient has purewic in place and per ED provider, declined in/out catheter - Insert foley order has been placed  # Elevated troponin with EKG changes - low clinical suspicion for ACS, considering PE given recent surgery. - second ekg ordered improved - second HS troponin pending - Discussed with ED provider and PE is considered however eGFR is low, repeat BMP post  1L and foley insertion and if improved, would recommend primary provider to order CTA of chest and CT ab/pel for pyelo - Routine cardiology consulted - Telemetry   # AKI suspect post-renal secondary to urinary retention - Insert foley catheter ordered  # Leukocytosis - etiology unknown at this time, likely secondary to UTI vs reactive post-op however given recent cervical surgery and on fever suppression (percocet), other infectious etiology can not be excluded - UA ordered from catheter - Blood cultures x 2 - CBC in the AM  DVT prophylaxis: enoxparin and SCDs Code Status: full Family Communication: discussed with spouse at bedside Disposition Plan: pending clinical course Consults called: routine cardiology consult requested via messaging system  Admission status: inpatient  Toluwani Ruder N Donovan Persley D.O. [facility]  If 7AM-7PM, please contact day-coverage [email or web address]  02/14/2020, 3:58 AM

## 2020-02-14 NOTE — Progress Notes (Signed)
  Echocardiogram 2D Echocardiogram has been performed.  Jannett Celestine 02/14/2020, 3:34 PM

## 2020-02-14 NOTE — Progress Notes (Signed)
   Dr. Rebecka Apley consult note reviewed - troponin is trending down. No chest pain complaints, therefore not on heparin. EKG today personally reviewed, shows NSR with <68mm ST depression in the anterolateral leads. Await echo findings today, will follow-up.  Pixie Casino, MD, [facility], [place] Director of the Advanced Lipid Disorders &  Cardiovascular Risk Reduction Clinic Diplomate of the American Board of Clinical Lipidology Attending Cardiologist  Direct Dial: [phone]  Fax: [phone]  Website:  [email or web address]

## 2020-02-14 NOTE — ED Notes (Signed)
Patient in CT, at this time.

## 2020-02-14 NOTE — Consult Note (Signed)
[facility] Consult Note   Primary Physician: No PCP listed Primary Cardiologist:   None  Reason for Consultation: Elevated troponin  HPI:    Cynthia Adams is a 74 year old female with past medical history significant for chronic leg swelling, GERD, hyperlipidemia, hypertension, and nerve pain, who is admitted with urinary retention after a recent cervical fusion procedure. Patient admits to having some lightheadedness, fatigue and weakness. Since her surgical procedure she has also had some imbalance. More recently she had difficulty urinating after her Foley catheter was removed.  The patient denies any fevers, chills, chest pain or resting shortness of breath.  The vital signs in the ED were: Blood pressure 105/59 mmHg, heart rate 106 bpm, Temp 98.4 and O2 saturation 97%. The labs were: Potassium 3.4, glucose 135, BUN 17, creatinine 1.36, WBC 16.2, hemoglobin 11.7 and platelets 231. The chest x-ray was unremarkable.  The ECG at 21:19 (02/13/2020), that I reviewed personally, documented sinus tachycardia, rate 110 bpm and nonspecific ST-T wave abnormalities.    Home Medications Prior to Admission medications   Medication Sig Start Date End Date Taking? Authorizing Provider  aspirin 81 MG tablet Take 81 mg by mouth daily.    [provider]  Cyanocobalamin (B-12 PO) Take by mouth.    [provider]  estradiol (ESTRACE) 0.5 MG tablet TAKE ONE-HALF TABLET BY  MOUTH DAILY 01/22/20   Emeterio Reeve, DO  fluticasone [facility]) 50 MCG/ACT nasal spray USE 2 SPRAYS IN [facility]  NOSTRIL DAILY 01/03/19   Gregor Hams, MD  gabapentin (NEURONTIN) 600 MG tablet 1 po am, 1 po lunch and 2 po qhs 07/10/19   Silverio Decamp, MD  gemfibrozil (LOPID) 600 MG tablet Take one tablet by mouth two times daily before a meal. 10/03/19   Emeterio Reeve, DO  HYDROcodone-acetaminophen (NORCO) 10-325 MG tablet Take 1 tablet by mouth 3 (three) times daily. 12/25/15   [provider]  loratadine (CLARITIN) 10 MG tablet Take 10 mg by mouth daily.    [provider]  metoprolol tartrate (LOPRESSOR) 50 MG tablet TAKE 1 TABLET BY MOUTH  TWICE DAILY 02/05/20   Emeterio Reeve, DO  metroNIDAZOLE (METROGEL) 0.75 % gel Apply 1 application topically 2 (two) times daily. 08/02/19   Samuel Bouche, NP  Multiple Vitamins-Minerals (MULTIVITAMIN WITH MINERALS) tablet Take 1 tablet by mouth daily.    [provider]  omeprazole (PRILOSEC) 40 MG capsule Take 40 mg by mouth daily.    [provider]  Wheat Dextrin (BENEFIBER DRINK MIX PO) Take by mouth.    [provider]    Past Medical History: Past Medical History:  Diagnosis Date  . Cataract 2017   Cataract surgery in 01-2016 and 02-2016  . GERD (gastroesophageal reflux disease)   . History of iron deficiency 06/13/2014  . Hyperlipidemia   . Hypertension   . Nerve pain   . Seasonal allergies     Past Surgical History: Past Surgical History:  Procedure Laterality Date  . ABDOMINAL HYSTERECTOMY    . BREAST BIOPSY    . BREAST CYST EXCISION    . CATARACT EXTRACTION W/ INTRAOCULAR LENS  IMPLANT, BILATERAL Bilateral 01-2016, 02-2016  . CATARACT EXTRACTION W/ INTRAOCULAR LENS & ANTERIOR VITRECTOMY Bilateral 01-29-2016, 02-19-2016   Right eye in 01-2016 and Left eye 02-2016  . CESAREAN SECTION    . CESAREAN SECTION    . CHOLECYSTECTOMY    . LUMBAR DISC SURGERY    . TONSILLECTOMY  Family History: Family History  Problem Relation Age of Onset  . Cancer Mother        breast  . Dementia Mother   . Hypertension Mother   . Hyperlipidemia Mother   . Breast cancer Mother   . Dementia Sister   . Hypertension Sister   . Hyperlipidemia Sister   . Hyperlipidemia Brother   . Hypertension Brother   . Hypertension Son   . Dementia Maternal Aunt   . Breast cancer Maternal Aunt   . Cancer Paternal Uncle   . Alcohol abuse Paternal Uncle   . Stroke Maternal Grandmother   .  Heart disease Paternal Grandmother   . Hyperlipidemia Sister   . Hypertension Sister   . Hyperlipidemia Sister   . Hypertension Sister   . Dementia Maternal Aunt   . Breast cancer Maternal Aunt   . Dementia Maternal Aunt   . Breast cancer Maternal Aunt   . Alcohol abuse Paternal Uncle     Social History: Social History   Socioeconomic History  . Marital status: Married    Spouse name: Jenny Reichmann  . Number of children: 2  . Years of education: 69  . Highest education level: 12th grade  Occupational History  . Occupation: Glass blower/designer    Comment: retired  Tobacco Use  . Smoking status: Never Smoker  . Smokeless tobacco: Never Used  Vaping Use  . Vaping Use: Never used  Substance and Sexual Activity  . Alcohol use: Yes    Comment: may have a glass of wine but very, very seldom.  . Drug use: No  . Sexual activity: Not Currently  Other Topics Concern  . Not on file  Social History Narrative   Goes out for birthdays, meets with class of 66 every month. Wants to get back into exercising. Works on one day of the week   Social Determinants of Radio broadcast assistant Strain:   . Difficulty of Paying Living Expenses: Not on file  Food Insecurity:   . Worried About Charity fundraiser in the Last Year: Not on file  . Ran Out of Food in the Last Year: Not on file  Transportation Needs:   . Lack of Transportation (Medical): Not on file  . Lack of Transportation (Non-Medical): Not on file  Physical Activity:   . Days of Exercise per Week: Not on file  . Minutes of Exercise per Session: Not on file  Stress:   . Feeling of Stress : Not on file  Social Connections:   . Frequency of Communication with Friends and Family: Not on file  . Frequency of Social Gatherings with Friends and Family: Not on file  . Attends Religious Services: Not on file  . Active Member of Clubs or Organizations: Not on file  . Attends Archivist Meetings: Not on file  . Marital Status: Not  on file    Allergies:  Allergies  Allergen Reactions  . Lovaza [Omega-3-Acid Ethyl Esters] Diarrhea  . Minocycline Diarrhea, Nausea And Vomiting and Other (See Comments)    Stomach pain    . Pantoprazole Swelling  . Sulfa Antibiotics Swelling  . Amoxicillin-Pot Clavulanate Diarrhea  . Lipitor [Atorvastatin] Other (See Comments)    Diarrhea and insomnia  . Pravastatin Diarrhea     Review of Systems: [y] = yes, [ ]  = no   . General: Weight gain [ ] ; Weight loss [ ] ; Anorexia [ ] ; Fatigue [ ] ; Fever [ ] ; Chills [ ] ; Weakness [  Y]  . Cardiac: Chest pain/pressure [ ] ; Resting SOB [ ] ; Exertional SOB [ ] ; Orthopnea [ ] ; Pedal Edema [ ] ; Palpitations [ ] ; Syncope [ ] ; Presyncope [ ] ; Paroxysmal nocturnal dyspnea[ ]   . Pulmonary: Cough [ ] ; Wheezing[ ] ; Hemoptysis[ ] ; Sputum [ ] ; Snoring [ ]   . GI: Vomiting[ ] ; Dysphagia[ ] ; Melena[ ] ; Hematochezia [ ] ; Heartburn[ ] ; Abdominal pain [ ] ; Constipation [ ] ; Diarrhea [ ] ; BRBPR [ ]   . GU: Hematuria[ ] ; Dysuria [ ] ; Nocturia[ ]   . Vascular: Pain in legs with walking [ ] ; Pain in feet with lying flat [ ] ; Non-healing sores [ ] ; Stroke [ ] ; TIA [ ] ; Slurred speech [ ] ;  . Neuro: Headaches[ ] ; Vertigo[ ] ; Seizures[ ] ; Paresthesias[ ] ;Blurred vision [ ] ; Diplopia [ ] ; Vision changes [ ]   . Ortho/Skin: Arthritis [ ] ; Joint pain [ ] ; Muscle pain [ ] ; Joint swelling [ ] ; Back Pain [ ] ; Rash [ ]   . Psych: Depression[ ] ; Anxiety[ ]   . Heme: Bleeding problems [ ] ; Clotting disorders [ ] ; Anemia [ ]   . Endocrine: Diabetes [ ] ; Thyroid dysfunction[ ]      Objective:    Vital Signs:   Temp:  [98.4 F (36.9 C)] 98.4 F (36.9 C) (10/06 2101) Pulse Rate:  [85-106] 91 (10/07 0245) Resp:  [13-23] 16 (10/07 0245) BP: (105-139)/(59-76) 133/60 (10/07 0245) SpO2:  [91 %-100 %] 96 % (10/07 0245) Weight:  [65.8 kg] 65.8 kg (10/07 0041)    Weight change: Filed Weights   02/14/20 0041  Weight: 65.8 kg    Intake/Output:   Intake/Output Summary (Last 24  hours) at 02/14/2020 0312 Last data filed at 02/14/2020 0229 Gross per 24 hour  Intake 1000 ml  Output --  Net 1000 ml      Physical Exam    General:  Well appearing. No resp difficulty HEENT: normal Neck: supple. JVP . Carotids 2+ bilat; no bruits. No lymphadenopathy or thyromegaly appreciated. Cor: PMI nondisplaced. Regular rate & rhythm. No rubs, gallops or murmurs. Lungs: clear Abdomen: soft, nontender, nondistended. No hepatosplenomegaly. No bruits or masses. Good bowel sounds. Extremities: no cyanosis, clubbing, rash, edema Neuro: alert & orientedx3, cranial nerves grossly intact. moves all 4 extremities w/o difficulty. Affect pleasant     Labs   Basic Metabolic Panel: Recent Labs  Lab 02/13/20 2130  NA 140  K 3.4*  CL 101  CO2 24  GLUCOSE 135*  BUN 17  CREATININE 1.36*  CALCIUM 9.6    Liver Function Tests: No results for input(s): AST, ALT, ALKPHOS, BILITOT, PROT, ALBUMIN in the last 168 hours. No results for input(s): LIPASE, AMYLASE in the last 168 hours. No results for input(s): AMMONIA in the last 168 hours.  CBC: Recent Labs  Lab 02/13/20 2130  WBC 16.2*  HGB 11.7*  HCT 35.9*  MCV 93.7  PLT 231    Cardiac Enzymes: No results for input(s): CKTOTAL, CKMB, CKMBINDEX, TROPONINI in the last 168 hours.  BNP: BNP (last 3 results) No results for input(s): BNP in the last 8760 hours.  ProBNP (last 3 results) No results for input(s): PROBNP in the last 8760 hours.   CBG: Recent Labs  Lab 02/13/20 2343  GLUCAP 127*    Coagulation Studies: No results for input(s): LABPROT, INR in the last 72 hours.   Imaging   DG Cervical Spine 1 View  Result Date: 02/14/2020 CLINICAL DATA:  Recent surgery, neck pain EXAM: DG CERVICAL SPINE - 1 VIEW COMPARISON:  February 11, 2020 FINDINGS: The patient is status ACDF at C4 through C7. No periprosthetic fracture is identified. Prevertebral soft tissue swelling and subcutaneous emphysema seen from C4 through  C7. IMPRESSION: Status post ACDF from C4 through C7 without acute complication. Prevertebral soft tissue swelling and subcutaneous emphysema, likely postsurgical. Electronically Signed   By: Prudencio Pair M.D.   On: 02/14/2020 00:42   DG Chest Port 1 View  Result Date: 02/14/2020 CLINICAL DATA:  Weakness EXAM: PORTABLE CHEST 1 VIEW COMPARISON:  July 15, 2006 FINDINGS: The heart size and mediastinal contours are within normal limits. Both lungs are clear. The visualized skeletal structures are unremarkable. Aortic calcifications are noted. IMPRESSION: No active disease. Electronically Signed   By: Constance Holster M.D.   On: 02/14/2020 01:54      Medications:     Current Medications:    Infusions:      Assessment/Plan   1. Elevated troponin The patient denies any typical chest pain. Her electrocardiogram only has nonspecific T wave changes. However the high-sensitivity troponin is significantly abnormal at 4665.  Would definitely investigate other reasons for troponin elevation such as an acute PE. Other conditions on the differential diagnosis would include things such as myopericarditis.  -Observe on telemetry -Serial ECGs -Continue to trend cardiac enzymes -Continue aspirin 81 mg daily -Continue metoprolol -Check a lipid panel -Transthoracic echocardiogram to evaluate LV function -Keep NPO for now   Meade Maw, MD  02/14/2020, 3:12 AM  Cardiology Overnight Team Please contact [facility] for night-coverage after hours (4p -7a ) and weekends on [email or web address]

## 2020-02-14 NOTE — Progress Notes (Signed)
  PROGRESS NOTE    Cynthia Adams  ZWC:[id number] DOB: [birth year] DOA: 02/13/2020 PCP: Emeterio Reeve, DO  Brief Narrative:  74 year old white female Cholecystectomy 2006 fibromyalgia reflux esophagitis HTN HLD arthritis Prior history esophageal stricture 02/07/2019 Rx with Dr. Eusebio [facility]ly [facility]  She just had a cervical surgery under Dr. Ellene Route several days ago at the surgical center for cervicalgia  Sent to [facility] emergency room 02/13/2020 secondary to urinary retention since cervical surgery Work-up showed expected soft tissue swelling C4-C7 EKG however showed ST-T wave depressions that improved a repeat EKG troponin was over 4000 Cardiologist was consulted recommended trending enzymes starting metoprolol aspirin getting an echo and keeping n.p.o. Consideration was given for pulmonary embolism however because of elevated creatinine CT was not obtained initially  I saw her at the bedside this morning she is comfortable she is not complaining of chest pain she has no fever or chills Her urine retention is better and she is passing good urine with Foley catheter that was placed We had a long discussion about her probably having a heart issue she has not had similar symptoms in the past or been told about that and she is not on anticoagulation  I have spoken with nursing to obtain CT scan stat and call down to CT for the same to rule out pulmonary embolism and then we can work-up other causes including possible cardiac   We will update the patient as possible later on today  Verneita Griffes, MD [facility] 8:44 AM

## 2020-02-14 NOTE — ED Notes (Signed)
Patient transported to X-ray 

## 2020-02-15 ENCOUNTER — Other Ambulatory Visit: Payer: Self-pay | Admitting: Medical

## 2020-02-15 DIAGNOSIS — I10 Essential (primary) hypertension: Secondary | ICD-10-CM

## 2020-02-15 DIAGNOSIS — R072 Precordial pain: Secondary | ICD-10-CM

## 2020-02-15 DIAGNOSIS — E785 Hyperlipidemia, unspecified: Secondary | ICD-10-CM

## 2020-02-15 DIAGNOSIS — R339 Retention of urine, unspecified: Principal | ICD-10-CM

## 2020-02-15 DIAGNOSIS — I214 Non-ST elevation (NSTEMI) myocardial infarction: Secondary | ICD-10-CM

## 2020-02-15 DIAGNOSIS — N179 Acute kidney failure, unspecified: Secondary | ICD-10-CM

## 2020-02-15 LAB — CBC WITH DIFFERENTIAL/PLATELET
Abs Immature Granulocytes: 0.05 10*3/uL (ref 0.00–0.07)
Basophils Absolute: 0.1 10*3/uL (ref 0.0–0.1)
Basophils Relative: 1 %
Eosinophils Absolute: 0.3 10*3/uL (ref 0.0–0.5)
Eosinophils Relative: 2 %
HCT: 41.4 % (ref 36.0–46.0)
Hemoglobin: 13.5 g/dL (ref 12.0–15.0)
Immature Granulocytes: 0 %
Lymphocytes Relative: 23 %
Lymphs Abs: 3.2 10*3/uL (ref 0.7–4.0)
MCH: 30.8 pg (ref 26.0–34.0)
MCHC: 32.6 g/dL (ref 30.0–36.0)
MCV: 94.5 fL (ref 80.0–100.0)
Monocytes Absolute: 0.8 10*3/uL (ref 0.1–1.0)
Monocytes Relative: 6 %
Neutro Abs: 9.3 10*3/uL — ABNORMAL HIGH (ref 1.7–7.7)
Neutrophils Relative %: 68 %
Platelets: 307 10*3/uL (ref 150–400)
RBC: 4.38 MIL/uL (ref 3.87–5.11)
RDW: 12.3 % (ref 11.5–15.5)
WBC: 13.7 10*3/uL — ABNORMAL HIGH (ref 4.0–10.5)
nRBC: 0 % (ref 0.0–0.2)

## 2020-02-15 LAB — COMPREHENSIVE METABOLIC PANEL
ALT: 33 U/L (ref 0–44)
AST: 50 U/L — ABNORMAL HIGH (ref 15–41)
Albumin: 4.3 g/dL (ref 3.5–5.0)
Alkaline Phosphatase: 90 U/L (ref 38–126)
Anion gap: 15 (ref 5–15)
BUN: 19 mg/dL (ref 8–23)
CO2: 23 mmol/L (ref 22–32)
Calcium: 10 mg/dL (ref 8.9–10.3)
Chloride: 100 mmol/L (ref 98–111)
Creatinine, Ser: 0.81 mg/dL (ref 0.44–1.00)
GFR calc non Af Amer: >60 mL/min (ref >60)
Glucose, Bld: 118 mg/dL — ABNORMAL HIGH (ref 70–99)
Potassium: 3.3 mmol/L — ABNORMAL LOW (ref 3.5–5.1)
Sodium: 138 mmol/L (ref 135–145)
Total Bilirubin: 0.9 mg/dL (ref 0.3–1.2)
Total Protein: 8.1 g/dL (ref 6.5–8.1)

## 2020-02-15 MED ORDER — ROSUVASTATIN CALCIUM 20 MG PO TABS
20.0000 mg | ORAL_TABLET | Freq: Every day | ORAL | Status: DC
  Administered 2020-02-15: 20 mg via ORAL
  Filled 2020-02-15: qty 1

## 2020-02-15 NOTE — Discharge Summary (Signed)
Physician Discharge Summary  Cynthia Adams YTK:[id number] DOB: 1945/07/23 DOA: 02/13/2020  PCP: Emeterio Reeve, DO  Admit date: 02/13/2020 Discharge date: 02/15/2020  Time spent: 35 minutes  Recommendations for Outpatient Follow-up:  1. Needs outpatient Lexiscan performed by cardiology 2. Needs to Chem-12 and CBC 1 week and further medication adjustment in the outpatient setting   Discharge Diagnoses:  Principal Problem:   Urinary retention Active Problems:   Essential hypertension   GERD (gastroesophageal reflux disease)   Troponin level elevated   AKI (acute kidney injury) (HCC)   Weakness of both legs   Leukocytosis   Discharge Condition: Improved  Diet recommendation: Heart healthy  Filed Weights   02/14/20 0041 02/14/20 1430 02/15/20 0427  Weight: 65.8 kg 62.5 kg 61.9 kg    History of present illness:  74 year old white female Cholecystectomy 2006 fibromyalgia reflux esophagitis HTN HLD arthritis Prior history esophageal stricture 02/07/2019 Rx with Dr. Eusebio Friendly [facility]  She just had a cervical surgery under Dr. Ellene Route several days ago at the surgical center for cervicalgia  Sent to [facility] emergency room 02/13/2020 secondary to urinary retention since cervical surgery Work-up showed expected soft tissue swelling C4-C7 EKG however showed ST-T wave depressions that improved a repeat EKG troponin was over 4000 Cardiologist was consulted recommended trending enzymes starting metoprolol aspirin getting an echo and keeping n.p.o. Consideration was given for pulmonary embolism however because of elevated creatinine CT was not obtained initially  Subsequently CT of chest was performed which did not show PE cardiology was consulted and patient had an echocardiogram which showed mid inferior wall akinesis suggestive of infarction with ST depressions in V1 V2  Dr. Debara Pickett saw the patient and recommended conservative management secondary to recent neurosurgery  and recommended aspirin beta-blocker and statin and also Myoview outpatient stress-if that shows ischemia would consider catheterization Patient was discharged on above meds  May require Plavix for DAPT and medical therapy for MI in the outpatient setting once Myoview was done  She has been counseled about follow-up and is aware of the same  On admission she had urinary retention and Foley catheter was placed but she had a sensation to pass urine after Foley clamped and patient was subsequently discharged home to the care of her husband who understood the plan of care  Discharge Exam: Vitals:   02/15/20 0718 02/15/20 1158  BP: 138/63 128/61  Pulse: 84 70  Resp: 16 17  Temp: 98.8 F (37.1 C) 98.1 F (36.7 C)  SpO2: 93% 98%    General: Coherent no distress EOMI NCAT I saw the patient walking in the hall she had a steady stable gait Cardiovascular: S1-S2 no murmur rub sinus rhythm on monitors Respiratory: Clinically clear no added sound no rales or rhonchi Abdomen slightly distended no rebound Foley in place when I saw her No lower extremity edema  Discharge Instructions   Discharge Instructions    Diet - low sodium heart healthy   Complete by: As directed    Discharge instructions   Complete by: As directed    You had a mild heart attack aorund the time of your procedure You will need close follow-up with Dr. Debara Pickett and cardiology in the OP setting for a lexiscan test of your heart Please follow wioth your primary in 1 week and get follow up labs   Increase activity slowly   Complete by: As directed      Allergies as of 02/15/2020      Reactions   Lovaza [  omega-3-acid Ethyl Esters] Diarrhea   Minocycline Diarrhea, Nausea And Vomiting, Other (See Comments)   Stomach pain   Pantoprazole Swelling   Sulfa Antibiotics Swelling   Amoxicillin-pot Clavulanate Diarrhea   Lipitor [atorvastatin] Other (See Comments)   Diarrhea and insomnia   Pravastatin Diarrhea       Medication List    STOP taking these medications   loratadine 10 MG tablet Commonly known as: CLARITIN   metroNIDAZOLE 0.75 % gel Commonly known as: METROGEL     TAKE these medications   aspirin 81 MG tablet Take 81 mg by mouth daily.   B-12 PO Take 1 mL by mouth daily.   BENEFIBER DRINK MIX PO Take 1 Scoop by mouth daily.   cyclobenzaprine 10 MG tablet Commonly known as: FLEXERIL Take 10 mg by mouth 3 (three) times daily.   estradiol 0.5 MG tablet Commonly known as: ESTRACE TAKE ONE-HALF TABLET BY  MOUTH DAILY   fluticasone 50 MCG/ACT nasal spray Commonly known as: FLONASE USE 2 SPRAYS IN EACH  NOSTRIL DAILY What changed:   when to take this  reasons to take this   gabapentin 600 MG tablet Commonly known as: NEURONTIN 1 po am, 1 po lunch and 2 po qhs What changed:   how much to take  how to take this  when to take this   gemfibrozil 600 MG tablet Commonly known as: LOPID Take one tablet by mouth two times daily before a meal. What changed:   how much to take  how to take this  when to take this   HYDROcodone-acetaminophen 10-325 MG tablet Commonly known as: NORCO Take 1 tablet by mouth 3 (three) times daily.   metoprolol tartrate 50 MG tablet Commonly known as: LOPRESSOR TAKE 1 TABLET BY MOUTH  TWICE DAILY   multivitamin with minerals tablet Take 1 tablet by mouth daily.   omeprazole 40 MG capsule Commonly known as: PRILOSEC Take 40 mg by mouth daily.   oxyCODONE-acetaminophen 5-325 MG tablet Commonly known as: PERCOCET/ROXICET Take 1 tablet by mouth every 4 (four) hours as needed for pain.      Allergies  Allergen Reactions  . Lovaza [Omega-3-Acid Ethyl Esters] Diarrhea  . Minocycline Diarrhea, Nausea And Vomiting and Other (See Comments)    Stomach pain    . Pantoprazole Swelling  . Sulfa Antibiotics Swelling  . Amoxicillin-Pot Clavulanate Diarrhea  . Lipitor [Atorvastatin] Other (See Comments)    Diarrhea and insomnia   . Pravastatin Diarrhea    Follow-up Information    [facility] Follow up on 03/10/2020.   Specialty: Cardiology Why: Please arrive 15 mintues early for your 10am stress test appointment. Do not eat after midnight the night before your test. Please see your discharge instructions for more information. Contact information: [address], [place]       Pixie Casino, MD Follow up on 03/21/2020.   Specialty: Cardiology Why: Please arrive 15 minutes early for your 1:30pm post-hospital follow-up visit. You will discuss your stress test results at this visit.  Contact information: [place] [address] [phone]                The results of significant diagnostics from this hospitalization (including imaging, microbiology, ancillary and laboratory) are listed below for reference.    Significant Diagnostic Studies: DG Cervical Spine 1 View  Result Date: 02/14/2020 CLINICAL DATA:  Recent surgery, neck pain EXAM: DG CERVICAL SPINE -  1 VIEW COMPARISON:  February 11, 2020 FINDINGS: The patient is status ACDF at C4 through C7. No periprosthetic fracture is identified. Prevertebral soft tissue swelling and subcutaneous emphysema seen from C4 through C7. IMPRESSION: Status post ACDF from C4 through C7 without acute complication. Prevertebral soft tissue swelling and subcutaneous emphysema, likely postsurgical. Electronically Signed   By: Prudencio Pair M.D.   On: 02/14/2020 00:42   CT ANGIO CHEST PE W OR WO CONTRAST  Result Date: 02/14/2020 CLINICAL DATA:  Weakness.  Cervical fusion on 02/11/2020 EXAM: CT ANGIOGRAPHY CHEST WITH CONTRAST TECHNIQUE: Multidetector CT imaging of the chest was performed using the standard protocol during bolus administration of intravenous contrast. Multiplanar CT image reconstructions and MIPs were obtained to evaluate the vascular anatomy. CONTRAST:  23mL  OMNIPAQUE IOHEXOL 350 MG/ML SOLN COMPARISON:  X-ray 02/14/2020.  CT abdomen pelvis 03/30/2011 FINDINGS: Cardiovascular: Satisfactory opacification of the pulmonary arteries to the segmental level. No evidence of pulmonary embolism. Normal heart size. No pericardial effusion. Thoracic aorta is nonaneurysmal. There are atherosclerotic calcifications of the aorta and coronary arteries. Mediastinum/Nodes: No enlarged mediastinal, hilar, or axillary lymph nodes. Thyroid gland, trachea, and esophagus demonstrate no significant findings. Lungs/Pleura: Mild dependent bibasilar atelectasis. Lungs are otherwise clear. No airspace consolidation, pleural effusion, or pneumothorax. Upper Abdomen: Partially visualized cyst within the left hepatic lobe. No acute findings within the visualized upper abdomen. Musculoskeletal: Partially visualized lower cervical ACDF hardware with postsurgical changes in the anterior soft tissues. Fluid and air containing collection within the left anterior soft tissues of the neck measuring 3.0 x 2.0 x 2.6 cm (series 5, image 10; series 10, image 105). Review of the MIP images confirms the above findings. IMPRESSION: 1. No evidence of pulmonary embolism. 2. Mild dependent bibasilar atelectasis. Lungs are otherwise clear. 3. Postoperative fluid and air containing collection within the left anterior soft tissues of the neck measuring 3.0 x 2.0 x 2.6 cm. Findings are favored to reflect postoperative hematoma/seroma given the recent surgery. Although less likely, an infected fluid collection is not excluded. 4. Aortic and coronary artery atherosclerosis. (ICD10-I70.0). Electronically Signed   By: Davina Poke D.O.   On: 02/14/2020 09:48   DG Chest Port 1 View  Result Date: 02/14/2020 CLINICAL DATA:  Weakness EXAM: PORTABLE CHEST 1 VIEW COMPARISON:  July 15, 2006 FINDINGS: The heart size and mediastinal contours are within normal limits. Both lungs are clear. The visualized skeletal structures  are unremarkable. Aortic calcifications are noted. IMPRESSION: No active disease. Electronically Signed   By: Constance Holster M.D.   On: 02/14/2020 01:54   ECHOCARDIOGRAM COMPLETE  Result Date: 02/14/2020    ECHOCARDIOGRAM REPORT   Patient Name:   SHIREE ALTEMUS Date of Exam: 02/14/2020 Medical Rec #:  [id number]      Height:       60.0 in Accession #:    [id number]     Weight:       145.0 lb Date of Birth:  July 22, 1945     BSA:          1.628 m Patient Age:    38 years       BP:           110/62 mmHg Patient Gender: F              HR:           80 bpm. Exam Location:  Inpatient Procedure: 2D Echo Indications:    elevated troponin  History:  Patient has no prior history of Echocardiogram examinations.                 Risk Factors:Hypertension and Dyslipidemia.  Sonographer:    Jannett Celestine RDCS (AE) Referring Phys: [id number] AMY N [facility]  Sonographer Comments: restricted mobility. neck brace. IMPRESSIONS  1. Left ventricular ejection fraction, by estimation, is 55 to 60%. The left ventricle has normal function. The left ventricle demonstrates regional wall motion abnormalities, the basal inferior and basal inferolateral wall segments are akinetic. Left ventricular diastolic parameters are consistent with Grade I diastolic dysfunction (impaired relaxation).  2. Right ventricular systolic function is normal. The right ventricular size is normal. Tricuspid regurgitation signal is inadequate for assessing PA pressure.  3. The mitral valve is normal in structure. No evidence of mitral valve regurgitation. No evidence of mitral stenosis.  4. The aortic valve is tricuspid. Aortic valve regurgitation is not visualized. No aortic stenosis is present. FINDINGS  Left Ventricle: Left ventricular ejection fraction, by estimation, is 55 to 60%. The left ventricle has normal function. The left ventricle demonstrates regional wall motion abnormalities. The left ventricular internal cavity size was normal in size. There is no  left ventricular hypertrophy. Left ventricular diastolic parameters are consistent with Grade I diastolic dysfunction (impaired relaxation). Right Ventricle: The right ventricular size is normal. No increase in right ventricular wall thickness. Right ventricular systolic function is normal. Tricuspid regurgitation signal is inadequate for assessing PA pressure. Left Atrium: Left atrial size was normal in size. Right Atrium: Right atrial size was normal in size. Pericardium: There is no evidence of pericardial effusion. Mitral Valve: The mitral valve is normal in structure. Mild mitral annular calcification. No evidence of mitral valve regurgitation. No evidence of mitral valve stenosis. Tricuspid Valve: The tricuspid valve is normal in structure. Tricuspid valve regurgitation is not demonstrated. Aortic Valve: The aortic valve is tricuspid. Aortic valve regurgitation is not visualized. No aortic stenosis is present. Pulmonic Valve: The pulmonic valve was normal in structure. Pulmonic valve regurgitation is not visualized. Aorta: The aortic root is normal in size and structure. Venous: The inferior vena cava was not well visualized. IAS/Shunts: No atrial level shunt detected by color flow Doppler.  LEFT VENTRICLE PLAX 2D LVIDd:         3.70 cm  Diastology LVIDs:         2.20 cm  LV e' medial:    2.94 cm/s LV PW:         0.90 cm  LV E/e' medial:  20.4 LV IVS:        0.80 cm  LV e' lateral:   5.55 cm/s LVOT diam:     2.20 cm  LV E/e' lateral: 10.8 LV SV:         67 LV SV Index:   41 LVOT Area:     3.80 cm  LEFT ATRIUM           Index LA diam:      2.70 cm 1.66 cm/m LA Vol (A2C): 34.2 ml 21.01 ml/m  AORTIC VALVE LVOT Vmax:   89.40 cm/s LVOT Vmean:  62.800 cm/s LVOT VTI:    0.177 m  AORTA Ao Root diam: 3.00 cm MITRAL VALVE MV Area (PHT): 2.14 cm     SHUNTS MV Decel Time: 354 msec     Systemic VTI:  0.18 m MV E velocity: 60.10 cm/s   Systemic Diam: 2.20 cm MV A velocity: 115.00 cm/s MV E/A ratio:  0.52 [facility] [employer]  MD Electronically signed by Loralie Champagne MD Signature Date/Time: 02/14/2020/5:38:34 PM    Final     Microbiology: Recent Results (from the past 240 hour(s))  Respiratory Panel by RT PCR (Flu A&B, Covid) - Nasopharyngeal Swab     Status: None   Collection Time: 02/14/20  2:55 AM   Specimen: Nasopharyngeal Swab  Result Value Ref Range Status   SARS Coronavirus 2 by RT PCR NEGATIVE NEGATIVE Final    Comment: (NOTE) SARS-CoV-2 target nucleic acids are NOT DETECTED.  The SARS-CoV-2 RNA is generally detectable in upper respiratoy specimens during the acute phase of infection. The lowest concentration of SARS-CoV-2 viral copies this assay can detect is 131 copies/mL. A negative result does not preclude SARS-Cov-2 infection and should not be used as the sole basis for treatment or other patient management decisions. A negative result may occur with  improper specimen collection/handling, submission of specimen other than nasopharyngeal swab, presence of viral mutation(s) within the areas targeted by this assay, and inadequate number of viral copies (<131 copies/mL). A negative result must be combined with clinical observations, patient history, and epidemiological information. The expected result is Negative.  Fact Sheet for Patients:  PinkCheek.be  Fact Sheet for Healthcare Providers:  GravelBags.it  This test is no t yet approved or cleared by the [place] FDA and  has been authorized for detection and/or diagnosis of SARS-CoV-2 by FDA under an Emergency Use Authorization (EUA). This EUA will remain  in effect (meaning this test can be used) for the duration of the COVID-19 declaration under Section 564(b)(1) of the Act, 21 U.S.C. section 360bbb-3(b)(1), unless the authorization is terminated or revoked sooner.     Influenza A by PCR NEGATIVE NEGATIVE Final   Influenza B by PCR NEGATIVE NEGATIVE Final    Comment:  (NOTE) The Xpert Xpress SARS-CoV-2/FLU/RSV assay is intended as an aid in  the diagnosis of influenza from Nasopharyngeal swab specimens and  should not be used as a sole basis for treatment. Nasal washings and  aspirates are unacceptable for Xpert Xpress SARS-CoV-2/FLU/RSV  testing.  Fact Sheet for Patients: PinkCheek.be  Fact Sheet for Healthcare Providers: GravelBags.it  This test is not yet approved or cleared by the [place] FDA and  has been authorized for detection and/or diagnosis of SARS-CoV-2 by  FDA under an Emergency Use Authorization (EUA). This EUA will remain  in effect (meaning this test can be used) for the duration of the  Covid-19 declaration under Section 564(b)(1) of the Act, 21  U.S.C. section 360bbb-3(b)(1), unless the authorization is  terminated or revoked. Performed at [facility], [place] [address] [place], [place] [address]   Urine culture     Status: Abnormal   Collection Time: 02/14/20  3:44 AM   Specimen: Urine, Catheterized  Result Value Ref Range Status   Specimen Description URINE, CATHETERIZED  Final   Special Requests   Final    NONE Performed at [facility], [place] [address], [place], [place] [address]    Culture MULTIPLE SPECIES PRESENT, SUGGEST RECOLLECTION (A)  Final   Report Status 02/14/2020 FINAL  Final  Culture, blood (routine x 2)     Status: None (Preliminary result)   Collection Time: 02/14/20  5:10 AM   Specimen: BLOOD  Result Value Ref Range Status   Specimen Description BLOOD SITE NOT SPECIFIED  Final   Special Requests   Final    BOTTLES DRAWN AEROBIC AND ANAEROBIC Blood Culture adequate volume   Culture  Final    NO GROWTH 1 DAY Performed at [facility], [place] [address] [place], [place] [address]    Report Status PENDING  Incomplete  Culture, blood (routine x 2)     Status: None (Preliminary result)   Collection Time: 02/14/20  3:32 PM    Specimen: BLOOD RIGHT ARM  Result Value Ref Range Status   Specimen Description BLOOD RIGHT ARM  Final   Special Requests   Final    BOTTLES DRAWN AEROBIC AND ANAEROBIC Blood Culture adequate volume   Culture   Final    NO GROWTH < 24 HOURS Performed at [facility], [place] [address] [place], [place] [address]    Report Status PENDING  Incomplete     Labs: Basic Metabolic Panel: Recent Labs  Lab 02/13/20 2130 02/14/20 0501 02/15/20 0929  NA 140 141 138  K 3.4* 3.2* 3.3*  CL 101 104 100  CO2 24 20* 23  GLUCOSE 135* 123* 118*  BUN 17 17 19   CREATININE 1.36* 0.95 0.81  CALCIUM 9.6 9.0 10.0   Liver Function Tests: Recent Labs  Lab 02/15/20 0929  AST 50*  ALT 33  ALKPHOS 90  BILITOT 0.9  PROT 8.1  ALBUMIN 4.3   No results for input(s): LIPASE, AMYLASE in the last 168 hours. No results for input(s): AMMONIA in the last 168 hours. CBC: Recent Labs  Lab 02/13/20 2130 02/14/20 0501 02/15/20 0929  WBC 16.2* 14.9* 13.7*  NEUTROABS  --   --  9.3*  HGB 11.7* 11.1* 13.5  HCT 35.9* 33.1* 41.4  MCV 93.7 94.0 94.5  PLT 231 217 307   Cardiac Enzymes: No results for input(s): CKTOTAL, CKMB, CKMBINDEX, TROPONINI in the last 168 hours. BNP: BNP (last 3 results) No results for input(s): BNP in the last 8760 hours.  ProBNP (last 3 results) No results for input(s): PROBNP in the last 8760 hours.  CBG: Recent Labs  Lab 02/13/20 2343  GLUCAP 127*       Signed:  Nita Sells MD   [facility] 02/15/2020, 5:15 PM

## 2020-02-15 NOTE — Discharge Instructions (Signed)
° °  You have a Stress Test scheduled at [facility]. Your doctor has ordered this test to check the blood flow in your heart arteries. ° °Please arrive 15 minutes early for paperwork. The whole test will take several hours. You may want to bring reading material to remain occupied while undergoing different parts of the test. ° °Instructions: °· No food/drink after midnight the night before. °· It is OK to take your morning meds with a sip of water EXCEPT for those types of medicines listed below or otherwise instructed. °· No caffeine/decaf products 24 hours before, including medicines such as Excedrin or Goody Powders. Call if there are any questions.  °· Wear comfortable clothes and shoes.  ° ° °What To Expect: °When you arrive in the lab, the technician will inject a small amount of radioactive tracer into your arm through an IV while you are resting quietly. This helps us to form pictures of your heart. You will likely only feel a sting from the IV. After a waiting period, resting pictures will be obtained under a big camera. These are the "before" pictures. ° °Next, you will be prepped for the stress portion of the test. This may include either walking on a treadmill or receiving a medicine that helps to dilate blood vessels in your heart to simulate the effect of exercise on your heart. If you are walking on a treadmill, you will walk at different paces to try to get your heart rate to a goal number that is based on your age. If your doctor has chosen the pharmacologic test, then you will receive a medicine through your IV that may cause temporary nausea, flushing, shortness of breath and sometimes chest discomfort or vomiting. This is typically short-lived and usually resolves quickly. If you experience symptoms, that does not automatically mean the test is abnormal. Some patients do not experience any symptoms at all. Your blood pressure and heart rate will be monitored, and we will  be watching your EKG on a computer screen for any changes. During this portion of the test, the radiologist will inject another small amount of radioactive tracer into your IV. After a waiting period, you will undergo a second set of pictures. These are the "after" pictures. ° °The doctor reading the test will compare the before-and-after images to look for evidence of heart blockages or heart weakness. The test usually takes 1 day to complete, but in certain instances (for example, if a patient is over a certain weight limit), the test may be done over the span of 2 days. ° ° °

## 2020-02-15 NOTE — Progress Notes (Signed)
DAILY PROGRESS NOTE   Patient Name: Cynthia Adams Date of Encounter: 02/15/2020 Cardiologist: No primary care provider on file.  Chief Complaint   No chest pain  Patient Profile   74 yo female with recent outpatient cervical spine surgery, presented with imbalance, fatigue and weakness after surgery and atypical chest pain. She was found to have NSTEMI with troponin of 4665. Cardiology was asked to evaluate.  Subjective   Troponin is trending down - no chest pain, therefore not on heparin. Echo yesterday confirms basal to mid inferior wall akinesis, suggesting recent myocardial infarction. EKG yesterday showed some minimal ST depression in v1/v2, no significant inferior changes ? Recent posterior infarct.   Objective   Vitals:   02/14/20 2351 02/15/20 0329 02/15/20 0427 02/15/20 0718  BP: 123/61 118/62  138/63  Pulse: 70 73  84  Resp: 18 16  16   Temp: 98.1 F (36.7 C) 97.9 F (36.6 C)  98.8 F (37.1 C)  TempSrc: Oral Oral  Oral  SpO2: 95% 93%  93%  Weight:   61.9 kg   Height:        Intake/Output Summary (Last 24 hours) at 02/15/2020 0824 Last data filed at 02/15/2020 0759 Gross per 24 hour  Intake 480 ml  Output 800 ml  Net -320 ml   Filed Weights   02/14/20 0041 02/14/20 1430 02/15/20 0427  Weight: 65.8 kg 62.5 kg 61.9 kg    Physical Exam   General appearance: alert and no distress Neck: no carotid bruit, no JVD, thyroid not enlarged, symmetric, no tenderness/mass/nodules and post-surgical, in soft neck collar Lungs: clear to auscultation bilaterally Heart: regular rate and rhythm, S1, S2 normal, no murmur, click, rub or gallop Abdomen: soft, non-tender; bowel sounds normal; no masses,  no organomegaly Extremities: extremities normal, atraumatic, no cyanosis or edema Pulses: 2+ and symmetric Skin: Skin color, texture, turgor normal. No rashes or lesions Neurologic: Grossly normal Psych: Pleasant  Inpatient Medications    Scheduled Meds:  aspirin  81  mg Oral Daily   Chlorhexidine Gluconate Cloth  6 each Topical Daily   cyclobenzaprine  10 mg Oral TID   enoxaparin (LOVENOX) injection  40 mg Subcutaneous Q24H   feeding supplement (ENSURE ENLIVE)  237 mL Oral BID BM   fluticasone  2 spray Each Nare Daily   gabapentin  600 mg Oral BID   And   gabapentin  1,200 mg Oral QHS   metoprolol tartrate  50 mg Oral BID    Continuous Infusions:   PRN Meds: oxyCODONE, oxyCODONE-acetaminophen   Labs   Results for orders placed or performed during the hospital encounter of 02/13/20 (from the past 48 hour(s))  Basic metabolic panel     Status: Abnormal   Collection Time: 02/13/20  9:30 PM  Result Value Ref Range   Sodium 140 135 - 145 mmol/L   Potassium 3.4 (L) 3.5 - 5.1 mmol/L   Chloride 101 98 - 111 mmol/L   CO2 24 22 - 32 mmol/L   Glucose, Bld 135 (H) 70 - 99 mg/dL    Comment: Glucose reference range applies only to samples taken after fasting for at least 8 hours.   BUN 17 8 - 23 mg/dL   Creatinine, Ser 1.36 (H) 0.44 - 1.00 mg/dL   Calcium 9.6 8.9 - 10.3 mg/dL   GFR calc non Af Amer 39 (L) >60 mL/min   Anion gap 15 5 - 15    Comment: Performed at [facility]  [address] [place], [place] [address]  CBC     Status: Abnormal   Collection Time: 02/13/20  9:30 PM  Result Value Ref Range   WBC 16.2 (H) 4.0 - 10.5 K/uL   RBC 3.83 (L) 3.87 - 5.11 MIL/uL   Hemoglobin 11.7 (L) 12.0 - 15.0 g/dL   HCT 35.9 (L) 36 - 46 %   MCV 93.7 80.0 - 100.0 fL   MCH 30.5 26.0 - 34.0 pg   MCHC 32.6 30.0 - 36.0 g/dL   RDW 12.7 11.5 - 15.5 %   Platelets 231 150 - 400 K/uL   nRBC 0.0 0.0 - 0.2 %    Comment: Performed at [facility], [place] [address] [place], [place] [address]  Troponin I (High Sensitivity)     Status: Abnormal   Collection Time: 02/13/20 10:59 PM  Result Value Ref Range   Troponin I (High Sensitivity) 4,665 (HH) <18 ng/L    Comment: CRITICAL RESULT CALLED TO, READ BACK BY AND VERIFIED WITH: OLDLAND B,RN  02/14/20 0101 WAYK Performed at [facility], [place] [address], [place], [place] [address]   CBG monitoring, ED     Status: Abnormal   Collection Time: 02/13/20 11:43 PM  Result Value Ref Range   Glucose-Capillary 127 (H) 70 - 99 mg/dL    Comment: Glucose reference range applies only to samples taken after fasting for at least 8 hours.  Respiratory Panel by RT PCR (Flu A&B, Covid) - Nasopharyngeal Swab     Status: None   Collection Time: 02/14/20  2:55 AM   Specimen: Nasopharyngeal Swab  Result Value Ref Range   SARS Coronavirus 2 by RT PCR NEGATIVE NEGATIVE    Comment: (NOTE) SARS-CoV-2 target nucleic acids are NOT DETECTED.  The SARS-CoV-2 RNA is generally detectable in upper respiratoy specimens during the acute phase of infection. The lowest concentration of SARS-CoV-2 viral copies this assay can detect is 131 copies/mL. A negative result does not preclude SARS-Cov-2 infection and should not be used as the sole basis for treatment or other patient management decisions. A negative result may occur with  improper specimen collection/handling, submission of specimen other than nasopharyngeal swab, presence of viral mutation(s) within the areas targeted by this assay, and inadequate number of viral copies (<131 copies/mL). A negative result must be combined with clinical observations, patient history, and epidemiological information. The expected result is Negative.  Fact Sheet for Patients:  PinkCheek.be  Fact Sheet for Healthcare Providers:  GravelBags.it  This test is no t yet approved or cleared by the [place] FDA and  has been authorized for detection and/or diagnosis of SARS-CoV-2 by FDA under an Emergency Use Authorization (EUA). This EUA will remain  in effect (meaning this test can be used) for the duration of the COVID-19 declaration under Section 564(b)(1) of the Act, 21 U.S.C. section  360bbb-3(b)(1), unless the authorization is terminated or revoked sooner.     Influenza A by PCR NEGATIVE NEGATIVE   Influenza B by PCR NEGATIVE NEGATIVE    Comment: (NOTE) The Xpert Xpress SARS-CoV-2/FLU/RSV assay is intended as an aid in  the diagnosis of influenza from Nasopharyngeal swab specimens and  should not be used as a sole basis for treatment. Nasal washings and  aspirates are unacceptable for Xpert Xpress SARS-CoV-2/FLU/RSV  testing.  Fact Sheet for Patients: PinkCheek.be  Fact Sheet for Healthcare Providers: GravelBags.it  This test is not yet approved or cleared by the [place] and  has been authorized  for detection and/or diagnosis of SARS-CoV-2 by  FDA under an Emergency Use Authorization (EUA). This EUA will remain  in effect (meaning this test can be used) for the duration of the  Covid-19 declaration under Section 564(b)(1) of the Act, 21  U.S.C. section 360bbb-3(b)(1), unless the authorization is  terminated or revoked. Performed at [facility], [place] [address] [place], [place] [address]   Urinalysis, Routine w reflex microscopic     Status: Abnormal   Collection Time: 02/14/20  3:44 AM  Result Value Ref Range   Color, Urine AMBER (A) YELLOW    Comment: BIOCHEMICALS MAY BE AFFECTED BY COLOR   APPearance CLOUDY (A) CLEAR   Specific Gravity, Urine 1.023 1.005 - 1.030   pH 5.0 5.0 - 8.0   Glucose, UA NEGATIVE NEGATIVE mg/dL   Hgb urine dipstick MODERATE (A) NEGATIVE   Bilirubin Urine NEGATIVE NEGATIVE   Ketones, ur 5 (A) NEGATIVE mg/dL   Protein, ur 100 (A) NEGATIVE mg/dL   Nitrite NEGATIVE NEGATIVE   Leukocytes,Ua SMALL (A) NEGATIVE   RBC / HPF 21-50 0 - 5 RBC/hpf   WBC, UA 21-50 0 - 5 WBC/hpf   Bacteria, UA RARE (A) NONE SEEN   Squamous Epithelial / LPF 0-5 0 - 5   Mucus PRESENT    Hyaline Casts, UA PRESENT     Comment: Performed at [facility], [address],  [place], [place] [address]  Urine culture     Status: Abnormal   Collection Time: 02/14/20  3:44 AM   Specimen: Urine, Catheterized  Result Value Ref Range   Specimen Description URINE, CATHETERIZED    Special Requests      NONE Performed at [facility] [address], [place], [place] [address]    Culture MULTIPLE SPECIES PRESENT, SUGGEST RECOLLECTION (A)    Report Status 02/14/2020 FINAL   Troponin I (High Sensitivity)     Status: Abnormal   Collection Time: 02/14/20  5:01 AM  Result Value Ref Range   Troponin I (High Sensitivity) 3,418 (HH) <18 ng/L    Comment: CRITICAL VALUE NOTED.  VALUE IS CONSISTENT WITH PREVIOUSLY REPORTED AND CALLED VALUE. (NOTE) Elevated high sensitivity troponin I (hsTnI) values and significant  changes across serial measurements may suggest ACS but many other  chronic and acute conditions are known to elevate hsTnI results.  Refer to the Links section for chest pain algorithms and additional  guidance. Performed at [facility], [place] [address], [place], [place] [address]   Basic metabolic panel     Status: Abnormal   Collection Time: 02/14/20  5:01 AM  Result Value Ref Range   Sodium 141 135 - 145 mmol/L   Potassium 3.2 (L) 3.5 - 5.1 mmol/L   Chloride 104 98 - 111 mmol/L   CO2 20 (L) 22 - 32 mmol/L   Glucose, Bld 123 (H) 70 - 99 mg/dL    Comment: Glucose reference range applies only to samples taken after fasting for at least 8 hours.   BUN 17 8 - 23 mg/dL   Creatinine, Ser 0.95 0.44 - 1.00 mg/dL   Calcium 9.0 8.9 - 10.3 mg/dL   GFR calc non Af Amer 59 (L) >60 mL/min   Anion gap 17 (H) 5 - 15    Comment: Performed at [facility] [address] [place],  [address]  CBC     Status: Abnormal   Collection Time: 02/14/20  5:01 AM  Result  Value Ref Range   WBC 14.9 (H) 4.0 - 10.5 K/uL   RBC 3.52 (L) 3.87 - 5.11 MIL/uL   Hemoglobin 11.1 (L) 12.0 - 15.0 g/dL   HCT 33.1 (L) 36 - 46 %   MCV 94.0 80.0 - 100.0 fL   MCH 31.5  26.0 - 34.0 pg   MCHC 33.5 30.0 - 36.0 g/dL   RDW 12.7 11.5 - 15.5 %   Platelets 217 150 - 400 K/uL   nRBC 0.0 0.0 - 0.2 %    Comment: Performed at [facility], [place] [address] [place], [place] [address]  Culture, blood (routine x 2)     Status: None (Preliminary result)   Collection Time: 02/14/20  5:10 AM   Specimen: BLOOD  Result Value Ref Range   Specimen Description BLOOD SITE NOT SPECIFIED    Special Requests      BOTTLES DRAWN AEROBIC AND ANAEROBIC Blood Culture adequate volume   Culture      NO GROWTH 1 DAY Performed at [facility], [place] [address], [place], [place] [address]    Report Status PENDING   Culture, blood (routine x 2)     Status: None (Preliminary result)   Collection Time: 02/14/20  3:32 PM   Specimen: BLOOD RIGHT ARM  Result Value Ref Range   Specimen Description BLOOD RIGHT ARM    Special Requests      BOTTLES DRAWN AEROBIC AND ANAEROBIC Blood Culture adequate volume   Culture      NO GROWTH < 24 HOURS Performed at [facility], [place] [address], [place], [place] [address]    Report Status PENDING     ECG   N/A  Telemetry   Sinus rhythm - Personally Reviewed  Radiology    DG Cervical Spine 1 View  Result Date: 02/14/2020 CLINICAL DATA:  Recent surgery, neck pain EXAM: DG CERVICAL SPINE - 1 VIEW COMPARISON:  February 11, 2020 FINDINGS: The patient is status ACDF at C4 through C7. No periprosthetic fracture is identified. Prevertebral soft tissue swelling and subcutaneous emphysema seen from C4 through C7. IMPRESSION: Status post ACDF from C4 through C7 without acute complication. Prevertebral soft tissue swelling and subcutaneous emphysema, likely postsurgical. Electronically Signed   By: Prudencio Pair M.D.   On: 02/14/2020 00:42   CT ANGIO CHEST PE W OR WO CONTRAST  Result Date: 02/14/2020 CLINICAL DATA:  Weakness.  Cervical fusion on 02/11/2020 EXAM: CT ANGIOGRAPHY CHEST WITH CONTRAST TECHNIQUE: Multidetector CT imaging of the  chest was performed using the standard protocol during bolus administration of intravenous contrast. Multiplanar CT image reconstructions and MIPs were obtained to evaluate the vascular anatomy. CONTRAST:  20mL OMNIPAQUE IOHEXOL 350 MG/ML SOLN COMPARISON:  X-ray 02/14/2020.  CT abdomen pelvis 03/30/2011 FINDINGS: Cardiovascular: Satisfactory opacification of the pulmonary arteries to the segmental level. No evidence of pulmonary embolism. Normal heart size. No pericardial effusion. Thoracic aorta is nonaneurysmal. There are atherosclerotic calcifications of the aorta and coronary arteries. Mediastinum/Nodes: No enlarged mediastinal, hilar, or axillary lymph nodes. Thyroid gland, trachea, and esophagus demonstrate no significant findings. Lungs/Pleura: Mild dependent bibasilar atelectasis. Lungs are otherwise clear. No airspace consolidation, pleural effusion, or pneumothorax. Upper Abdomen: Partially visualized cyst within the left hepatic lobe. No acute findings within the visualized upper abdomen. Musculoskeletal: Partially visualized lower cervical ACDF hardware with postsurgical changes in the anterior soft tissues. Fluid and air containing collection within the left anterior soft tissues of the neck measuring 3.0 x 2.0 x 2.6  cm (series 5, image 10; series 10, image 105). Review of the MIP images confirms the above findings. IMPRESSION: 1. No evidence of pulmonary embolism. 2. Mild dependent bibasilar atelectasis. Lungs are otherwise clear. 3. Postoperative fluid and air containing collection within the left anterior soft tissues of the neck measuring 3.0 x 2.0 x 2.6 cm. Findings are favored to reflect postoperative hematoma/seroma given the recent surgery. Although less likely, an infected fluid collection is not excluded. 4. Aortic and coronary artery atherosclerosis. (ICD10-I70.0). Electronically Signed   By: Davina Poke D.O.   On: 02/14/2020 09:48   DG Chest Port 1 View  Result Date:  02/14/2020 CLINICAL DATA:  Weakness EXAM: PORTABLE CHEST 1 VIEW COMPARISON:  July 15, 2006 FINDINGS: The heart size and mediastinal contours are within normal limits. Both lungs are clear. The visualized skeletal structures are unremarkable. Aortic calcifications are noted. IMPRESSION: No active disease. Electronically Signed   By: Constance Holster M.D.   On: 02/14/2020 01:54   ECHOCARDIOGRAM COMPLETE  Result Date: 02/14/2020    ECHOCARDIOGRAM REPORT   Patient Name:   Cynthia Adams Date of Exam: 02/14/2020 Medical Rec #:  [id number]      Height:       60.0 in Accession #:    [id number]     Weight:       145.0 lb Date of Birth:  Jan 15, 1946     BSA:          1.628 m Patient Age:    12 years       BP:           110/62 mmHg Patient Gender: F              HR:           80 bpm. Exam Location:  Inpatient Procedure: 2D Echo Indications:    elevated troponin  History:        Patient has no prior history of Echocardiogram examinations.                 Risk Factors:Hypertension and Dyslipidemia.  Sonographer:    Jannett Celestine RDCS (AE) Referring Phys: [id number] AMY N [facility]  Sonographer Comments: restricted mobility. neck brace. IMPRESSIONS  1. Left ventricular ejection fraction, by estimation, is 55 to 60%. The left ventricle has normal function. The left ventricle demonstrates regional wall motion abnormalities, the basal inferior and basal inferolateral wall segments are akinetic. Left ventricular diastolic parameters are consistent with Grade I diastolic dysfunction (impaired relaxation).  2. Right ventricular systolic function is normal. The right ventricular size is normal. Tricuspid regurgitation signal is inadequate for assessing PA pressure.  3. The mitral valve is normal in structure. No evidence of mitral valve regurgitation. No evidence of mitral stenosis.  4. The aortic valve is tricuspid. Aortic valve regurgitation is not visualized. No aortic stenosis is present. FINDINGS  Left Ventricle: Left ventricular  ejection fraction, by estimation, is 55 to 60%. The left ventricle has normal function. The left ventricle demonstrates regional wall motion abnormalities. The left ventricular internal cavity size was normal in size. There is no left ventricular hypertrophy. Left ventricular diastolic parameters are consistent with Grade I diastolic dysfunction (impaired relaxation). Right Ventricle: The right ventricular size is normal. No increase in right ventricular wall thickness. Right ventricular systolic function is normal. Tricuspid regurgitation signal is inadequate for assessing PA pressure. Left Atrium: Left atrial size was normal in size. Right Atrium: Right atrial size was normal in size. Pericardium: There is  no evidence of pericardial effusion. Mitral Valve: The mitral valve is normal in structure. Mild mitral annular calcification. No evidence of mitral valve regurgitation. No evidence of mitral valve stenosis. Tricuspid Valve: The tricuspid valve is normal in structure. Tricuspid valve regurgitation is not demonstrated. Aortic Valve: The aortic valve is tricuspid. Aortic valve regurgitation is not visualized. No aortic stenosis is present. Pulmonic Valve: The pulmonic valve was normal in structure. Pulmonic valve regurgitation is not visualized. Aorta: The aortic root is normal in size and structure. Venous: The inferior vena cava was not well visualized. IAS/Shunts: No atrial level shunt detected by color flow Doppler.  LEFT VENTRICLE PLAX 2D LVIDd:         3.70 cm  Diastology LVIDs:         2.20 cm  LV e' medial:    2.94 cm/s LV PW:         0.90 cm  LV E/e' medial:  20.4 LV IVS:        0.80 cm  LV e' lateral:   5.55 cm/s LVOT diam:     2.20 cm  LV E/e' lateral: 10.8 LV SV:         67 LV SV Index:   41 LVOT Area:     3.80 cm  LEFT ATRIUM           Index LA diam:      2.70 cm 1.66 cm/m LA Vol (A2C): 34.2 ml 21.01 ml/m  AORTIC VALVE LVOT Vmax:   89.40 cm/s LVOT Vmean:  62.800 cm/s LVOT VTI:    0.177 m  AORTA Ao  Root diam: 3.00 cm MITRAL VALVE MV Area (PHT): 2.14 cm     SHUNTS MV Decel Time: 354 msec     Systemic VTI:  0.18 m MV E velocity: 60.10 cm/s   Systemic Diam: 2.20 cm MV A velocity: 115.00 cm/s MV E/A ratio:  0.52 Loralie Champagne MD Electronically signed by Loralie Champagne MD Signature Date/Time: 02/14/2020/5:38:34 PM    Final     Cardiac Studies   See echo  Assessment   1. Principal Problem: 2.   Urinary retention 3. Active Problems: 4.   Essential hypertension 5.   GERD (gastroesophageal reflux disease) 6.   Troponin level elevated 7.   AKI (acute kidney injury) ([place]) 8.   Weakness of both legs 9.   Leukocytosis 10.   Plan   1. NSTEMI -Ms. Tuccillo likely had a perioperative MI after her outpatient elective surgery on October 08/2019.  She presented without active chest pain but was noted to have a significantly elevated troponin which is been downtrending.  Echo shows basal to mid inferior akinesis suggestive of infarct.  Given her recent neurosurgery, she is not a candidate for intervention or dual antiplatelet therapy at this time.  I would recommend conservative therapy with aspirin, beta-blocker and statin.  Would recommend outpatient Myoview stress testing in about a month and follow-up afterwards with me.  If there is significant ischemia, we could consider catheterization at that time, otherwise I would recommend adding Plavix for DAPT x1 year, medical therapy for MI.  No interventions planned today, will reorder a diet.  From a cardiac standpoint I think she could be discharged. 2. Dyslipidemia - LDL 140 in 10/2019 - could not tolerate atorvastatin or pravastatin. Was on gemfibrozil. Would recommend holding gemfibrozil and start rosuvastatin 20 mg daily. If this is not tolerated, may need to evaluated for PCSK9i as outpatient. 3. HTN- mild elevation here. Continue  50 mg metoprolol BID for post-MI mortality reduction.  [facility] will sign off.   Medication Recommendations:  As  above Other recommendations (labs, testing, etc):  Outpatient lexiscan myoview Follow up as an outpatient:  Dr. Debara Pickett in 4-6 weeks.  Time Spent Directly with Patient:  I have spent a total of 25 minutes with the patient reviewing hospital notes, telemetry, EKGs, labs and examining the patient as well as establishing an assessment and plan that was discussed personally with the patient.  > 50% of time was spent in direct patient care.  Length of Stay:  LOS: 1 day   Pixie Casino, MD, [facility], [place] Director of the [facility] Lip[facility]  Card[facility]ovascular [facility] Reduction [facility] Diplomate of the American Board of Clinical Lipidology Attending Cardiologist  Direct Dial: [phone]   Fax: [phone]  Website:  [email or web address] 02/15/2020, 8:24 AM

## 2020-02-15 NOTE — Progress Notes (Signed)
D/C instructions given and reviewed. No questions asked but encouraged to call with any concerns.

## 2020-02-19 LAB — CULTURE, BLOOD (ROUTINE X 2)
Culture: NO GROWTH
Culture: NO GROWTH
Special Requests: ADEQUATE
Special Requests: ADEQUATE

## 2020-03-01 LAB — HEMOGLOBIN A1C: Hemoglobin A1C: 5.8

## 2020-03-05 ENCOUNTER — Encounter: Payer: Self-pay | Admitting: Osteopathic Medicine

## 2020-03-05 ENCOUNTER — Ambulatory Visit (INDEPENDENT_AMBULATORY_CARE_PROVIDER_SITE_OTHER): Payer: Medicare Other | Admitting: Osteopathic Medicine

## 2020-03-05 ENCOUNTER — Other Ambulatory Visit: Payer: Self-pay

## 2020-03-05 VITALS — BP 148/69 | HR 60 | Temp 98.3°F | Wt 134.0 lb

## 2020-03-05 DIAGNOSIS — R300 Dysuria: Secondary | ICD-10-CM

## 2020-03-05 DIAGNOSIS — Z23 Encounter for immunization: Secondary | ICD-10-CM

## 2020-03-05 DIAGNOSIS — I10 Essential (primary) hypertension: Secondary | ICD-10-CM

## 2020-03-05 DIAGNOSIS — R339 Retention of urine, unspecified: Secondary | ICD-10-CM

## 2020-03-05 DIAGNOSIS — N179 Acute kidney failure, unspecified: Secondary | ICD-10-CM

## 2020-03-05 NOTE — Patient Instructions (Signed)
Plan: Keep appointment with cardiology  Continue current medications  Will check blood work and urine today to make sure kidneys are ok May consider Rx for urinary issues if needed If unable to urinate, please let me know! We can place a catheter if needed and get you into urology urgently.

## 2020-03-05 NOTE — Progress Notes (Signed)
Cynthia Adams is a 74 y.o. female who presents to  [facility] [facility]  today, 03/05/20, seeking care for the following:  . Hospital follow-up - admitted for urinary retention and elevated troponin/abn EKG. Cardiology recommended medical management and she has f/u with them. Still having feelings of urinary urgency and discomfort when bladder gets really full then mild dysuria on initiation of urinary stream, no hematuria, no flank pain, no abd pain, no fever, no nausea.      ASSESSMENT & PLAN with other pertinent findings:  The primary encounter diagnosis was Dysuria. Diagnoses of Need for influenza vaccination, Essential hypertension, Urinary retention, and AKI (acute kidney injury) ([place]) were also pertinent to this visit.   No results found for this or any previous visit (from the past 24 hour(s)).  Dysuria seems less likely UTI, pt would like to defer urology f/u unless retention recurs. I think this is reasonable.   Repeat labs today. Pt unable to provide UA in office, given specimen cup and orders to take to lab downstairs, get blood draw same time to follow CBC/CMP  On exam, RRR, (+)murmur systolic, CTABL, healing surgical incision L anterior neck   Patient Instructions  Plan: Keep appointment with cardiology  Continue current medications  Will check blood work and urine today to make sure kidneys are ok May consider Rx for urinary issues if needed If unable to urinate, please let me know! We can place a catheter if needed and get you into urology urgently.      Orders Placed This Encounter  Procedures  . Urine Culture  . Flu Vaccine QUAD High Dose(Fluad)  . Urinalysis, Routine w reflex microscopic  . CBC  . COMPLETE METABOLIC PANEL WITH GFR  . POCT URINALYSIS DIP (CLINITEK)    No orders of the defined types were placed in this encounter.      Follow-up instructions: Return in about 6 months (around 09/03/2020)  for ROUTINE CHECK-UP IF DOING WELL, OTHERWISE SEE ME AS NEEDED / BASED ON RESULTS .                                         BP (!) 148/69 (BP Location: Left Arm, Patient Position: Sitting, Cuff Size: Normal)   Pulse 60   Temp 98.3 F (36.8 C) (Oral)   Wt 134 lb (60.8 kg)   BMI 26.17 kg/m   Current Meds  Medication Sig  . aspirin 81 MG tablet Take 81 mg by mouth daily.  . Cyanocobalamin (B-12 PO) Take 1 mL by mouth daily.   Marland Kitchen estradiol (ESTRACE) 0.5 MG tablet TAKE ONE-HALF TABLET BY  MOUTH DAILY (Patient taking differently: Take 0.25 mg by mouth daily. )  . fluticasone (FLONASE) 50 MCG/ACT nasal spray USE 2 SPRAYS IN EACH  NOSTRIL DAILY (Patient taking differently: Place 2 sprays into both nostrils daily as needed for allergies. )  . gabapentin (NEURONTIN) 600 MG tablet 1 po am, 1 po lunch and 2 po qhs (Patient taking differently: Take 600 mg by mouth See admin instructions. 1 po am, 1 po lunch and 2 po qhs)  . gemfibrozil (LOPID) 600 MG tablet Take one tablet by mouth two times daily before a meal. (Patient taking differently: Take 600 mg by mouth 2 (two) times daily before a meal. Take one tablet by mouth two times daily before a meal.)  .  HYDROcodone-acetaminophen (NORCO) 10-325 MG tablet Take 1 tablet by mouth 3 (three) times daily.  . metoprolol tartrate (LOPRESSOR) 50 MG tablet TAKE 1 TABLET BY MOUTH  TWICE DAILY (Patient taking differently: Take 50 mg by mouth 2 (two) times daily. )  . Multiple Vitamins-Minerals (MULTIVITAMIN WITH MINERALS) tablet Take 1 tablet by mouth daily.  Marland Kitchen omeprazole (PRILOSEC) 40 MG capsule Take 40 mg by mouth daily.  . Wheat Dextrin (BENEFIBER DRINK MIX PO) Take 1 Scoop by mouth daily.     No results found for this or any previous visit (from the past 72 hour(s)).  No results found.     All questions at time of visit were answered - patient instructed to contact office with any additional concerns or updates.   ER/RTC precautions were reviewed with the patient as applicable.   Please note: voice recognition software was used to produce this document, and typos may escape review. Please contact Dr. Sheppard Coil for any needed clarifications.

## 2020-03-06 LAB — CBC
HCT: 32.2 % — ABNORMAL LOW (ref 35.0–45.0)
Hemoglobin: 10.6 g/dL — ABNORMAL LOW (ref 11.7–15.5)
MCH: 30.6 pg (ref 27.0–33.0)
MCHC: 32.9 g/dL (ref 32.0–36.0)
MCV: 93.1 fL (ref 80.0–100.0)
MPV: 12 fL (ref 7.5–12.5)
Platelets: 407 10*3/uL — ABNORMAL HIGH (ref 140–400)
RBC: 3.46 10*6/uL — ABNORMAL LOW (ref 3.80–5.10)
RDW: 12.2 % (ref 11.0–15.0)
WBC: 11.8 10*3/uL — ABNORMAL HIGH (ref 3.8–10.8)

## 2020-03-06 LAB — COMPLETE METABOLIC PANEL WITH GFR
AG Ratio: 1.5 (calc) (ref 1.0–2.5)
ALT: 8 U/L (ref 6–29)
AST: 13 U/L (ref 10–35)
Albumin: 4.4 g/dL (ref 3.6–5.1)
Alkaline phosphatase (APISO): 127 U/L (ref 37–153)
BUN/Creatinine Ratio: 16 (calc) (ref 6–22)
BUN: 17 mg/dL (ref 7–25)
CO2: 26 mmol/L (ref 20–32)
Calcium: 10.1 mg/dL (ref 8.6–10.4)
Chloride: 100 mmol/L (ref 98–110)
Creat: 1.04 mg/dL — ABNORMAL HIGH (ref 0.60–0.93)
GFR, Est African American: 62 mL/min/{1.73_m2} (ref >=60)
GFR, Est Non African American: 53 mL/min/{1.73_m2} — ABNORMAL LOW (ref >=60)
Globulin: 3 g/dL (calc) (ref 1.9–3.7)
Glucose, Bld: 92 mg/dL (ref 65–99)
Potassium: 4.5 mmol/L (ref 3.5–5.3)
Sodium: 138 mmol/L (ref 135–146)
Total Bilirubin: 0.3 mg/dL (ref 0.2–1.2)
Total Protein: 7.4 g/dL (ref 6.1–8.1)

## 2020-03-06 MED ORDER — CIPROFLOXACIN HCL 500 MG PO TABS: 500.0000 mg | ORAL_TABLET | Freq: Two times a day (BID) | ORAL | 0 refills | Status: DC

## 2020-03-06 NOTE — Addendum Note (Signed)
Addended by: Maryla Morrow on: 03/06/2020 06:34 PM   Modules accepted: Orders

## 2020-03-07 ENCOUNTER — Other Ambulatory Visit: Payer: Self-pay

## 2020-03-07 ENCOUNTER — Ambulatory Visit (HOSPITAL_COMMUNITY): Payer: Medicare Other | Attending: Internal Medicine

## 2020-03-07 DIAGNOSIS — R072 Precordial pain: Secondary | ICD-10-CM

## 2020-03-07 LAB — MYOCARDIAL PERFUSION IMAGING
LV dias vol: 45 mL (ref 46–106)
LV sys vol: 7 mL
Peak HR: 84 {beats}/min
Rest HR: 56 {beats}/min
SDS: 3
SRS: 1
SSS: 4
TID: 0.74

## 2020-03-07 LAB — URINALYSIS, ROUTINE W REFLEX MICROSCOPIC
Bilirubin Urine: NEGATIVE
Glucose, UA: NEGATIVE
Hyaline Cast: NONE SEEN /LPF
Ketones, ur: NEGATIVE
Nitrite: POSITIVE — AB
Specific Gravity, Urine: 1.011 (ref 1.001–1.03)
pH: 5.5 (ref 5.0–8.0)

## 2020-03-07 LAB — URINE CULTURE
MICRO NUMBER:: 11129629
SPECIMEN QUALITY:: ADEQUATE

## 2020-03-07 MED ORDER — REGADENOSON 0.4 MG/5ML IV SOLN
0.4000 mg | Freq: Once | INTRAVENOUS | Status: AC
  Administered 2020-03-07: 0.4 mg via INTRAVENOUS

## 2020-03-07 MED ORDER — TECHNETIUM TC 99M TETROFOSMIN IV KIT
10.2000 | PACK | Freq: Once | INTRAVENOUS | Status: AC | PRN
  Administered 2020-03-07: 10.2 via INTRAVENOUS
  Filled 2020-03-07: qty 11

## 2020-03-07 MED ORDER — TECHNETIUM TC 99M TETROFOSMIN IV KIT
31.4000 | PACK | Freq: Once | INTRAVENOUS | Status: AC | PRN
  Administered 2020-03-07: 31.4 via INTRAVENOUS
  Filled 2020-03-07: qty 32

## 2020-03-10 ENCOUNTER — Encounter (HOSPITAL_COMMUNITY): Payer: Medicare Other

## 2020-03-12 ENCOUNTER — Other Ambulatory Visit: Payer: Self-pay | Admitting: Osteopathic Medicine

## 2020-03-12 DIAGNOSIS — Z1231 Encounter for screening mammogram for malignant neoplasm of breast: Secondary | ICD-10-CM

## 2020-03-21 ENCOUNTER — Encounter: Payer: Self-pay | Admitting: Internal Medicine

## 2020-03-21 ENCOUNTER — Ambulatory Visit: Payer: Medicare Other | Admitting: Internal Medicine

## 2020-03-21 ENCOUNTER — Other Ambulatory Visit: Payer: Self-pay

## 2020-03-21 VITALS — BP 140/58 | HR 57 | Ht 60.0 in | Wt 136.2 lb

## 2020-03-21 DIAGNOSIS — I214 Non-ST elevation (NSTEMI) myocardial infarction: Secondary | ICD-10-CM

## 2020-03-21 DIAGNOSIS — I10 Essential (primary) hypertension: Secondary | ICD-10-CM

## 2020-03-21 DIAGNOSIS — E782 Mixed hyperlipidemia: Secondary | ICD-10-CM

## 2020-03-21 NOTE — Progress Notes (Signed)
OFFICE NOTE  Chief Complaint:  Follow-up NSTEMI  Primary Care Physician: Emeterio Reeve, DO  HPI:  Cynthia Adams is a 74 y.o. female with a past medial history significant for hypertension, dyslipidemia, GERD and chronic back pain who was admitted with urinary retention after recent cervical fusion procedure. He denied any chest pain or shortness of breath. An EKG on admission showed nonspecific T wave changes however high-sensitivity troponin was significantly elevated at 4665. Peak cardiac enzymes demonstrated troponin elevation at 3418. Findings were consistent for NSTEMI, thought to be perioperative. She subsequently underwent an echo which showed an EF of 55 to 60% with basal inferior and inferolateral akinesis. This was more suggestive of perioperative infarct. As she was having no chest pain, and the fact that she had had recent surgery, she was not a candidate for cardiac catheterization. I recommended medical therapy and an outpatient Myoview stress test to look for significant reversible ischemia. She did have a stress test performed on March 07, 2020 which was a low risk study and demonstrated no ischemia. I personally reviewed the images and agree. Additionally, there was no evidence for scar. LVEF was greater than 70%. Today she returns for follow-up. Overall she says she is feeling well. She denies any chest pain, shortness of breath or other symptoms. She is recovering from her surgery. In the interim she has seen Dr. Ellene Route back and has a follow-up next month.  PMHx:  Past Medical History:  Diagnosis Date  . Cataract 2017   Cataract surgery in 01-2016 and 02-2016  . GERD (gastroesophageal reflux disease)   . History of iron deficiency 06/13/2014  . Hyperlipidemia   . Hypertension   . Nerve pain   . Seasonal allergies     Past Surgical History:  Procedure Laterality Date  . ABDOMINAL HYSTERECTOMY    . BREAST BIOPSY    . BREAST CYST EXCISION    . CATARACT  EXTRACTION W/ INTRAOCULAR LENS  IMPLANT, BILATERAL Bilateral 01-2016, 02-2016  . CATARACT EXTRACTION W/ INTRAOCULAR LENS & ANTERIOR VITRECTOMY Bilateral 01-29-2016, 02-19-2016   Right eye in 01-2016 and Left eye 02-2016  . CERVICAL FUSION  02/11/2020  . CESAREAN SECTION    . CESAREAN SECTION    . CHOLECYSTECTOMY    . LUMBAR DISC SURGERY    . TONSILLECTOMY      FAMHx:  Family History  Problem Relation Age of Onset  . Cancer Mother        breast  . Dementia Mother   . Hypertension Mother   . Hyperlipidemia Mother   . Breast cancer Mother   . Dementia Sister   . Hypertension Sister   . Hyperlipidemia Sister   . Hyperlipidemia Brother   . Hypertension Brother   . Hypertension Son   . Dementia Maternal Aunt   . Breast cancer Maternal Aunt   . Cancer Paternal Uncle   . Alcohol abuse Paternal Uncle   . Stroke Maternal Grandmother   . Heart disease Paternal Grandmother   . Hyperlipidemia Sister   . Hypertension Sister   . Hyperlipidemia Sister   . Hypertension Sister   . Dementia Maternal Aunt   . Breast cancer Maternal Aunt   . Dementia Maternal Aunt   . Breast cancer Maternal Aunt   . Alcohol abuse Paternal Uncle     SOCHx:   reports that she has never smoked. She has never used smokeless tobacco. She reports previous alcohol use. She reports that she does not use drugs.  ALLERGIES:  Allergies  Allergen Reactions  . Lovaza [Omega-3-Acid Ethyl Esters] Diarrhea  . Minocycline Diarrhea, Nausea And Vomiting and Other (See Comments)    Stomach pain    . Pantoprazole Swelling  . Sulfa Antibiotics Swelling  . Amoxicillin-Pot Clavulanate Diarrhea  . Lipitor [Atorvastatin] Other (See Comments)    Diarrhea and insomnia  . Pravastatin Diarrhea    ROS: Pertinent items noted in HPI and remainder of comprehensive ROS otherwise negative.  HOME MEDS: Current Outpatient Medications on File Prior to Visit  Medication Sig Dispense Refill  . aspirin 81 MG tablet Take 81 mg  by mouth daily.    . Cyanocobalamin (B-12 PO) Take 1 mL by mouth daily.     Marland Kitchen estradiol (ESTRACE) 0.5 MG tablet TAKE ONE-HALF TABLET BY  MOUTH DAILY (Patient taking differently: Take 0.25 mg by mouth daily. ) 45 tablet 3  . gabapentin (NEURONTIN) 600 MG tablet 1 po am, 1 po lunch and 2 po qhs (Patient taking differently: Take 600 mg by mouth See admin instructions. 1 po am, 1 po lunch and 2 po qhs) 450 tablet 3  . gemfibrozil (LOPID) 600 MG tablet Take one tablet by mouth two times daily before a meal. (Patient taking differently: Take 600 mg by mouth 2 (two) times daily before a meal. Take one tablet by mouth two times daily before a meal.) 180 tablet 3  . HYDROcodone-acetaminophen (NORCO) 10-325 MG tablet Take 1 tablet by mouth 3 (three) times daily.    . metoprolol tartrate (LOPRESSOR) 50 MG tablet TAKE 1 TABLET BY MOUTH  TWICE DAILY (Patient taking differently: Take 50 mg by mouth 2 (two) times daily. ) 180 tablet 1  . Multiple Vitamins-Minerals (MULTIVITAMIN WITH MINERALS) tablet Take 1 tablet by mouth daily.    Marland Kitchen omeprazole (PRILOSEC) 40 MG capsule Take 40 mg by mouth daily.    . Wheat Dextrin (BENEFIBER DRINK MIX PO) Take by mouth daily. Pt takes 2 teaspoons in the morning.    . ciprofloxacin (CIPRO) 500 MG tablet Take 1 tablet (500 mg total) by mouth 2 (two) times daily. 14 tablet 0  . fluticasone (FLONASE) 50 MCG/ACT nasal spray USE 2 SPRAYS IN EACH  NOSTRIL DAILY (Patient not taking: Reported on 03/21/2020) 48 g 3   No current facility-administered medications on file prior to visit.    LABS/IMAGING: No results found for this or any previous visit (from the past 48 hour(s)). No results found.  LIPID PANEL:    Component Value Date/Time   CHOL 207 (H) 10/22/2019 1705   TRIG 139 10/22/2019 1705   HDL 41 (L) 10/22/2019 1705   CHOLHDL 5.0 (H) 10/22/2019 1705   VLDL 35 (H) 05/24/2016 0843   LDLCALC 140 (H) 10/22/2019 1705     WEIGHTS: Wt Readings from Last 3 Encounters:  03/21/20  136 lb 3.2 oz (61.8 kg)  03/07/20 136 lb (61.7 kg)  03/05/20 134 lb (60.8 kg)    VITALS: BP (!) 140/58 (BP Location: Left Arm, Patient Position: Sitting)   Pulse (!) 57   Ht 5' (1.524 m)   Wt 136 lb 3.2 oz (61.8 kg)   SpO2 98%   BMI 26.60 kg/m   EXAM: General appearance: alert and no distress Neck: no carotid bruit, no JVD and thyroid not enlarged, symmetric, no tenderness/mass/nodules Lungs: clear to auscultation bilaterally Heart: regular rate and rhythm Abdomen: soft, non-tender; bowel sounds normal; no masses,  no organomegaly Extremities: extremities normal, atraumatic, no cyanosis or edema Pulses: 2+ and symmetric Skin: Skin color,  texture, turgor normal. No rashes or lesions Neurologic: Grossly normal Psych: Pleasant  EKG: Deferred  ASSESSMENT: 1. NSTEMI, suspected perioperative MI 2. Dyslipidemia with statin intolerance 3. Hypertension  PLAN: 1.   Ms. Dorantes had an NSTEMI by troponins however her Myoview stress test demonstrated no ischemia or evidence of scar. Echo showed basal inferior and inferolateral hypokinesis to akinesis. EF however was normal on stress testing. I wonder if this could have been stress-induced cardiomyopathy with EF recovery, however the troponin is much higher than I what is suspected. Either way she has had [place] no anginal symptoms. I do not think there is a role with a low risk Myoview and normal EF on echo to proceed with cardiac catheterization at this point. Would recommend continuing aspirin and beta-blocker. Her LDL cholesterol is high. She cannot tolerate statins in the past and has been on gemfibrozil. I recommended rosuvastatin but it does not appear that she was started on that. We will plan a repeat lipid profile and perhaps consider adding that and may switch her gemfibrozil to fenofibrate. She says she has some trouble swallowing the pills.  Follow-up with me in about 3 months. Thanks again for allowing me to participate in her  care.  Pixie Casino, MD, [facility], [place] Director of the Advanced Lipid Disorders &  Cardiovascular Risk Reduction Clinic Diplomate of the American Board of Clinical Lipidology Attending Cardiologist  Direct Dial: [phone]  Fax: [phone]  Website:  [email or web address] Catha Ontko 03/21/2020, 2:04 PM

## 2020-03-21 NOTE — Patient Instructions (Signed)
Medication Instructions:  Your physician recommends that you continue on your current medications as directed. Please refer to the Current Medication list given to you today.  *If you need a refill on your cardiac medications before your next appointment, please call your pharmacy*   Lab Work: FASTING lipid panel   If you have labs (blood work) drawn today and your tests are completely normal, you will receive your results only by: Marland Kitchen MyChart Message (if you have MyChart) OR . A paper copy in the mail If you have any lab test that is abnormal or we need to change your treatment, we will call you to review the results.   Testing/Procedures: NONE   Follow-Up: At [facility], you and your health needs are our priority.  As part of our continuing mission to provide you with exceptional heart care, we have created designated Provider Care Teams.  These Care Teams include your primary Cardiologist (physician) and Advanced Practice Providers (APPs -  Physician Assistants and Nurse Practitioners) who all work together to provide you with the care you need, when you need it.  We recommend signing up for the patient portal called "MyChart".  Sign up information is provided on this After Visit Summary.  MyChart is used to connect with patients for Virtual Visits (Telemedicine).  Patients are able to view lab/test results, encounter notes, upcoming appointments, etc.  Non-urgent messages can be sent to your provider as well.   To learn more about what you can do with MyChart, go to [email or web address].    Your next appointment:   3 month(s)  The format for your next appointment:   In Person  Provider:   You may see Pixie Casino, MD or one of the following Advanced Practice Providers on your designated Care Team:    Almyra Deforest, PA-C  Fabian Sharp, PA-C or   Roby Lofts, [place]    Other Instructions

## 2020-03-25 LAB — LIPID PANEL
Chol/HDL Ratio: 5.4 ratio — ABNORMAL HIGH (ref 0.0–4.4)
Cholesterol, Total: 252 mg/dL — ABNORMAL HIGH (ref 100–199)
HDL: 47 mg/dL (ref >39)
LDL Chol Calc (NIH): 181 mg/dL — ABNORMAL HIGH (ref 0–99)
Triglycerides: 131 mg/dL (ref 0–149)
VLDL Cholesterol Cal: 24 mg/dL (ref 5–40)

## 2020-03-28 ENCOUNTER — Other Ambulatory Visit: Payer: Self-pay | Admitting: *Deleted

## 2020-03-28 DIAGNOSIS — E782 Mixed hyperlipidemia: Secondary | ICD-10-CM

## 2020-03-28 MED ORDER — ROSUVASTATIN CALCIUM 40 MG PO TABS: 40.0000 mg | ORAL_TABLET | Freq: Every day | ORAL | 4 refills | Status: DC

## 2020-04-18 ENCOUNTER — Telehealth: Payer: Self-pay | Admitting: Internal Medicine

## 2020-04-18 NOTE — Telephone Encounter (Signed)
[facility] ED encounter viewable in Epic  Routed to MD for advice

## 2020-04-18 NOTE — Telephone Encounter (Signed)
Patient advised to stop statin. She will remain on gemfibrozil, which she has been taking for years. She has follow up with Angie PA in Feb 2022

## 2020-04-18 NOTE — Telephone Encounter (Signed)
Pt c/o medication issue:  1. Name of Medication: rosuvastatin (CRESTOR) 40 MG tablet  2. How are you currently taking this medication (dosage and times per day)? 1 tablet by mouth daily   3. Are you having a reaction (difficulty breathing--STAT)? Yes   4. What is your medication issue? Cynthia Adams is calling stating last night she had to go to the ED due to her HR racing and her mouth and throat feeling like it was starting to swell. All the test they ran came back fine so she believes it is a reaction to this medication, due to this she did not take it today. Please advise.

## 2020-04-18 NOTE — Telephone Encounter (Signed)
Ok advise her to stay off the medication and we can discuss other options at follow-up  Dr Lemmie Evens

## 2020-04-30 ENCOUNTER — Ambulatory Visit: Payer: Medicare Other

## 2020-04-30 ENCOUNTER — Other Ambulatory Visit: Payer: Self-pay

## 2020-06-04 ENCOUNTER — Other Ambulatory Visit: Payer: Self-pay | Admitting: *Deleted

## 2020-06-04 DIAGNOSIS — E782 Mixed hyperlipidemia: Secondary | ICD-10-CM

## 2020-06-12 NOTE — Progress Notes (Signed)
Cardiology Office Note:    Date:  06/24/2020   ID:  MINDAY GUINAN, DOB 11/08/1945, MRN [id number]  PCP:  Emeterio Reeve, DO  Cardiologist:  Pixie Casino, MD   Referring MD: Emeterio Reeve, DO   Chief Complaint  Patient presents with  . Follow-up    Allergic reaction to crestor    History of Present Illness:    Cynthia Adams is a 75 y.o. female with a hx of HTN, HLD, GERD, and chronic back pain. She had a perioperative infarct during recent OP cervical spine surgery. She was admitted for urinary retention and found to have elevated troponin. HST peaked at 4665 Echo with normal EF but basal inferior and inferolateral akinesis. Because she was not having chest pain and had recent surgery, angiography was deferred. She underwent OP NST on 03/07/20 that showed no evidence of reversible ischemia. She was seen in follow up on 03/21/20 and was recovering from surgery, no cardiac complaints.  Dr. Debara Pickett questions a stress-induced cardiomyopathy with EF recovery. He did not plan further ischemic evaluation. Crestor was recommended.   She called our office 04/18/20 following an ER visit for heart racing, difficulty breathing, and sensation of throat and mouth swelling. She believed this was a reaction to crestor and has stopped that medication. She continued gemfibrozil.   She presents for follow up. She had lipid panel drawn ahead of today's appt.   06/19/2020: Cholesterol, Total 221; HDL 49; LDL Chol Calc (NIH) 156; Triglycerides 89  LDL was improved from 181 to 156, she is now off of crestor.   Today, she recounts her ER visit and had racing heart beat and elevated BP. She sat in the waiting room for 3 hrs and her symptoms had normalized and she left prior to being seen. She did stop taking crestor, but she states today that she doesn't think it was the medication.   She feels well today without problems. She has no complaints other than her cholesterol. LDL is still 156, as above.  She has cut out sugar and has lost about 27 lbs. She has reduced fatty foods. She is very reluctant to start a new medication but is willing to try zetia.    Past Medical History:  Diagnosis Date  . Cataract 2017   Cataract surgery in 01-2016 and 02-2016  . GERD (gastroesophageal reflux disease)   . History of iron deficiency 06/13/2014  . Hyperlipidemia   . Hypertension   . Nerve pain   . Seasonal allergies     Past Surgical History:  Procedure Laterality Date  . ABDOMINAL HYSTERECTOMY    . BREAST BIOPSY    . BREAST CYST EXCISION Right   . CATARACT EXTRACTION W/ INTRAOCULAR LENS  IMPLANT, BILATERAL Bilateral 01-2016, 02-2016  . CATARACT EXTRACTION W/ INTRAOCULAR LENS & ANTERIOR VITRECTOMY Bilateral 01-29-2016, 02-19-2016   Right eye in 01-2016 and Left eye 02-2016  . CERVICAL FUSION  02/11/2020  . CESAREAN SECTION    . CESAREAN SECTION    . CHOLECYSTECTOMY    . LUMBAR DISC SURGERY    . TONSILLECTOMY      Current Medications: Current Meds  Medication Sig  . aspirin 81 MG tablet Take 81 mg by mouth daily.  . Cyanocobalamin (B-12 PO) Take 1 mL by mouth daily.   Marland Kitchen estradiol (ESTRACE) 0.5 MG tablet TAKE ONE-HALF TABLET BY  MOUTH DAILY (Patient taking differently: Take 0.25 mg by mouth daily.)  . fluticasone (FLONASE) 50 MCG/ACT nasal spray USE 2 SPRAYS  IN EACH  NOSTRIL DAILY  . gabapentin (NEURONTIN) 600 MG tablet 1 po am, 1 po lunch and 2 po qhs (Patient taking differently: Take 600 mg by mouth See admin instructions. 1 po am, 1 po lunch and 2 po qhs)  . gemfibrozil (LOPID) 600 MG tablet Take one tablet by mouth two times daily before a meal. (Patient taking differently: Take 600 mg by mouth 2 (two) times daily before a meal. Take one tablet by mouth two times daily before a meal.)  . HYDROcodone-acetaminophen (NORCO) 10-325 MG tablet Take 1 tablet by mouth 3 (three) times daily.  . metoprolol tartrate (LOPRESSOR) 50 MG tablet TAKE 1 TABLET BY MOUTH  TWICE DAILY (Patient taking  differently: Take 50 mg by mouth 2 (two) times daily.)  . Multiple Vitamins-Minerals (MULTIVITAMIN WITH MINERALS) tablet Take 1 tablet by mouth daily.  Marland Kitchen omeprazole (PRILOSEC) 40 MG capsule Take 40 mg by mouth daily.  . Wheat Dextrin (BENEFIBER DRINK MIX PO) Take by mouth daily. Pt takes 2 teaspoons in the morning.  . [DISCONTINUED] ciprofloxacin (CIPRO) 500 MG tablet Take 1 tablet (500 mg total) by mouth 2 (two) times daily.     Allergies:   Lovaza [omega-3-acid ethyl esters], Minocycline, Pantoprazole, Sulfa antibiotics, Rosuvastatin, Amoxicillin-pot clavulanate, Lipitor [atorvastatin], and Pravastatin   Social History   Socioeconomic History  . Marital status: Married    Spouse name: Jenny Reichmann  . Number of children: 2  . Years of education: 61  . Highest education level: 12th grade  Occupational History  . Occupation: Glass blower/designer    Comment: retired  Tobacco Use  . Smoking status: Never Smoker  . Smokeless tobacco: Never Used  Vaping Use  . Vaping Use: Never used  Substance and Sexual Activity  . Alcohol use: Not Currently    Comment: may have a glass of wine but very, very seldom.  . Drug use: Never  . Sexual activity: Not Currently  Other Topics Concern  . Not on file  Social History Narrative   Goes out for birthdays, meets with class of 66 every month. Wants to get back into exercising. Works on one day of the week   Social Determinants of Radio broadcast assistant Strain: Not on file  Food Insecurity: Not on file  Transportation Needs: Not on file  Physical Activity: Not on file  Stress: Not on file  Social Connections: Not on file     Family History: The patient's family history includes Alcohol abuse in her paternal uncle and paternal uncle; Breast cancer in her maternal aunt, maternal aunt, maternal aunt, and mother; Cancer in her mother and paternal uncle; Dementia in her maternal aunt, maternal aunt, maternal aunt, mother, and sister; Heart disease in her  paternal grandmother; Hyperlipidemia in her brother, mother, sister, sister, and sister; Hypertension in her brother, mother, sister, sister, sister, and son; Stroke in her maternal grandmother.  ROS:   Please see the history of present illness.    All other systems reviewed and are negative.  EKGs/Labs/Other Studies Reviewed:    The following studies were reviewed today:  NST 03/07/20:  Lexiscan is electrically negative for ischemia  Myovue with normal perfusion. No ischemia or scar  LVEF 84%  This is a low risk study.     EKG:  EKG is not ordered today.    Recent Labs: 07/31/2019: TSH 4.08 03/05/2020: ALT 8; BUN 17; Creat 1.04; Hemoglobin 10.6; Platelets 407; Potassium 4.5; Sodium 138  Recent Lipid Panel    Component  Value Date/Time   CHOL 221 (H) 06/19/2020 0905   TRIG 89 06/19/2020 0905   HDL 49 06/19/2020 0905   CHOLHDL 4.5 (H) 06/19/2020 0905   CHOLHDL 5.0 (H) 10/22/2019 1705   VLDL 35 (H) 05/24/2016 0843   LDLCALC 156 (H) 06/19/2020 0905   LDLCALC 140 (H) 10/22/2019 1705    Physical Exam:    VS:  BP 130/77   Pulse 74   Ht 5' (1.524 m)   Wt 130 lb 12.8 oz (59.3 kg)   SpO2 94%   BMI 25.55 kg/m     Wt Readings from Last 3 Encounters:  06/24/20 130 lb 12.8 oz (59.3 kg)  03/21/20 136 lb 3.2 oz (61.8 kg)  03/07/20 136 lb (61.7 kg)     GEN:  Well nourished, well developed in no acute distress HEENT: Normal NECK: No JVD; No carotid bruits LYMPHATICS: No lymphadenopathy CARDIAC: RRR, no murmurs, rubs, gallops RESPIRATORY:  Clear to auscultation without rales, wheezing or rhonchi  ABDOMEN: Soft, non-tender, non-distended MUSCULOSKELETAL:  No edema; No deformity  SKIN: Warm and dry NEUROLOGIC:  Alert and oriented x 3 PSYCHIATRIC:  Normal affect   ASSESSMENT:    1. Non-ST elevation (NSTEMI) myocardial infarction ([place])   2. Mixed hyperlipidemia   3. Essential hypertension   4. Racing heart beat    PLAN:    In order of problems listed  above:  NSTEMI - echo with preserved EF but WMA - low risk myoview - question of stress-induced cardiomyopathy although troponin elevation somewhat out of proportion  - no angina - will focus on risk factor modification - continue ASA, BB, lopid   Hyperlipidemia - she suspects she had an allergic reaction to crestor that included throat and mouth swelling - will avoid initiation with another statin at this time - 06/19/2020: Cholesterol, Total 221; HDL 49; LDL Chol Calc (NIH) 156; Triglycerides 89 - may need to consider PCSK9i - will start zetia with gemfibrozil  - will repeat lipds in 4-6 weeks day before follow up with Dr. Debara Pickett   Hypertension - continue metoprolol tartrate - pressure well-controlled   Heart racing - isolated incident in the setting of ?allergic reaction to crestor - if it happens again, she will call our office - may need a heart monitor   Follow up with Dr. Debara Pickett in 4-6 weeks with zetia and fasting labs.    Medication Adjustments/Labs and Tests Ordered: Current medicines are reviewed at length with the patient today.  Concerns regarding medicines are outlined above.  No orders of the defined types were placed in this encounter.  No orders of the defined types were placed in this encounter.   Signed, Ledora Bottcher, [place]  06/24/2020 12:04 PM    [facility]

## 2020-06-18 ENCOUNTER — Ambulatory Visit (INDEPENDENT_AMBULATORY_CARE_PROVIDER_SITE_OTHER): Payer: Medicare Other

## 2020-06-18 ENCOUNTER — Other Ambulatory Visit: Payer: Self-pay

## 2020-06-18 DIAGNOSIS — Z1231 Encounter for screening mammogram for malignant neoplasm of breast: Secondary | ICD-10-CM

## 2020-06-19 ENCOUNTER — Other Ambulatory Visit: Payer: Self-pay | Admitting: *Deleted

## 2020-06-19 DIAGNOSIS — E782 Mixed hyperlipidemia: Secondary | ICD-10-CM

## 2020-06-19 LAB — LIPID PANEL
Chol/HDL Ratio: 4.5 ratio — ABNORMAL HIGH (ref 0.0–4.4)
Cholesterol, Total: 221 mg/dL — ABNORMAL HIGH (ref 100–199)
HDL: 49 mg/dL (ref >39)
LDL Chol Calc (NIH): 156 mg/dL — ABNORMAL HIGH (ref 0–99)
Triglycerides: 89 mg/dL (ref 0–149)
VLDL Cholesterol Cal: 16 mg/dL (ref 5–40)

## 2020-06-24 ENCOUNTER — Ambulatory Visit: Payer: Medicare Other | Admitting: Physician Assistant

## 2020-06-24 ENCOUNTER — Other Ambulatory Visit: Payer: Self-pay

## 2020-06-24 ENCOUNTER — Encounter: Payer: Self-pay | Admitting: Physician Assistant

## 2020-06-24 VITALS — BP 130/77 | HR 74 | Ht 60.0 in | Wt 130.8 lb

## 2020-06-24 DIAGNOSIS — I214 Non-ST elevation (NSTEMI) myocardial infarction: Secondary | ICD-10-CM | POA: Diagnosis not present

## 2020-06-24 DIAGNOSIS — R000 Tachycardia, unspecified: Secondary | ICD-10-CM | POA: Diagnosis not present

## 2020-06-24 DIAGNOSIS — I10 Essential (primary) hypertension: Secondary | ICD-10-CM

## 2020-06-24 DIAGNOSIS — E782 Mixed hyperlipidemia: Secondary | ICD-10-CM | POA: Diagnosis not present

## 2020-06-24 MED ORDER — EZETIMIBE 10 MG PO TABS: 10.0000 mg | ORAL_TABLET | Freq: Every day | ORAL | 3 refills | Status: DC

## 2020-06-24 NOTE — Patient Instructions (Signed)
Medication Instructions:  Start Zetia 10 mg ( 1 Tablet Daily) *If you need a refill on your cardiac medications before your next appointment, please call your pharmacy*   Lab Work: Lipid Panel If you have labs (blood work) drawn today and your tests are completely normal, you will receive your results only by: Marland Kitchen MyChart Message (if you have MyChart) OR . A paper copy in the mail If you have any lab test that is abnormal or we need to change your treatment, we will call you to review the results.   Testing/Procedures: No Testing   Follow-Up: At [facility], you and your health needs are our priority.  As part of our continuing mission to provide you with exceptional heart care, we have created designated Provider Care Teams.  These Care Teams include your primary Cardiologist (physician) and Advanced Practice Providers (APPs -  Physician Assistants and Nurse Practitioners) who all work together to provide you with the care you need, when you need it.  We recommend signing up for the patient portal called "MyChart".  Sign up information is provided on this After Visit Summary.  MyChart is used to connect with patients for Virtual Visits (Telemedicine).  Patients are able to view lab/test results, encounter notes, upcoming appointments, etc.  Non-urgent messages can be sent to your provider as well.   To learn more about what you can do with MyChart, go to [email or web address].    Your next appointment:   4-6 weeks   The format for your next appointment:   In Person  Provider:   K. Mali Hilty, MD

## 2020-07-07 ENCOUNTER — Other Ambulatory Visit: Payer: Self-pay | Admitting: Osteopathic Medicine

## 2020-07-29 ENCOUNTER — Encounter: Payer: Self-pay | Admitting: Osteopathic Medicine

## 2020-07-31 LAB — LIPID PANEL
Chol/HDL Ratio: 3.1 ratio (ref 0.0–4.4)
Cholesterol, Total: 185 mg/dL (ref 100–199)
HDL: 60 mg/dL (ref >39)
LDL Chol Calc (NIH): 111 mg/dL — ABNORMAL HIGH (ref 0–99)
Triglycerides: 75 mg/dL (ref 0–149)
VLDL Cholesterol Cal: 14 mg/dL (ref 5–40)

## 2020-08-05 ENCOUNTER — Other Ambulatory Visit: Payer: Self-pay

## 2020-08-05 ENCOUNTER — Ambulatory Visit: Payer: Medicare Other | Admitting: Internal Medicine

## 2020-08-05 ENCOUNTER — Encounter: Payer: Self-pay | Admitting: Internal Medicine

## 2020-08-05 VITALS — BP 147/65 | HR 57 | Ht 60.0 in | Wt 130.0 lb

## 2020-08-05 DIAGNOSIS — I10 Essential (primary) hypertension: Secondary | ICD-10-CM

## 2020-08-05 DIAGNOSIS — E782 Mixed hyperlipidemia: Secondary | ICD-10-CM | POA: Diagnosis not present

## 2020-08-05 NOTE — Patient Instructions (Signed)

## 2020-08-05 NOTE — Progress Notes (Signed)
OFFICE NOTE  Chief Complaint:  Follow-up  Primary Care Physician: Emeterio Reeve, DO  HPI:  Cynthia Adams is a 75 y.o. female with a past medial history significant for hypertension, dyslipidemia, GERD and chronic back pain who was admitted with urinary retention after recent cervical fusion procedure. He denied any chest pain or shortness of breath. An EKG on admission showed nonspecific T wave changes however high-sensitivity troponin was significantly elevated at 4665. Peak cardiac enzymes demonstrated troponin elevation at 3418. Findings were consistent for NSTEMI, thought to be perioperative. She subsequently underwent an echo which showed an EF of 55 to 60% with basal inferior and inferolateral akinesis. This was more suggestive of perioperative infarct. As she was having no chest pain, and the fact that she had had recent surgery, she was not a candidate for cardiac catheterization. I recommended medical therapy and an outpatient Myoview stress test to look for significant reversible ischemia. She did have a stress test performed on March 07, 2020 which was a low risk study and demonstrated no ischemia. I personally reviewed the images and agree. Additionally, there was no evidence for scar. LVEF was greater than 70%. Today she returns for follow-up. Overall she says she is feeling well. She denies any chest pain, shortness of breath or other symptoms. She is recovering from her surgery. In the interim she has seen Dr. Ellene Route back and has a follow-up next month.  08/05/2020  Cynthia Adams is seen today in follow-up.  She saw Angie Duke last month for symptoms which she felt like her acute allergic reaction to Crestor.  She had some throat and tongue swelling.  She also had side effects with ezetimibe which she was placed on at that time as an alternative.  She said this caused some chest discomfort and GI discomfort.  At this point she says she does not want to take any other  medications.  Overall she says she feels "fine".  I did discuss the fact that her cholesterol has improved somewhat on medication with dietary changes but that it is likely to increase again off of therapy.  Her most recent lipid profile showed total 185, triglycerides 75, HDL 60 and LDL 111, this is down from 181 more recently.  PMHx:  Past Medical History:  Diagnosis Date  . Cataract 2017   Cataract surgery in 01-2016 and 02-2016  . GERD (gastroesophageal reflux disease)   . History of iron deficiency 06/13/2014  . Hyperlipidemia   . Hypertension   . Nerve pain   . Seasonal allergies     Past Surgical History:  Procedure Laterality Date  . ABDOMINAL HYSTERECTOMY    . BREAST BIOPSY    . BREAST CYST EXCISION Right   . CATARACT EXTRACTION W/ INTRAOCULAR LENS  IMPLANT, BILATERAL Bilateral 01-2016, 02-2016  . CATARACT EXTRACTION W/ INTRAOCULAR LENS & ANTERIOR VITRECTOMY Bilateral 01-29-2016, 02-19-2016   Right eye in 01-2016 and Left eye 02-2016  . CERVICAL FUSION  02/11/2020  . CESAREAN SECTION    . CESAREAN SECTION    . CHOLECYSTECTOMY    . LUMBAR DISC SURGERY    . TONSILLECTOMY      FAMHx:  Family History  Problem Relation Age of Onset  . Cancer Mother        breast  . Dementia Mother   . Hypertension Mother   . Hyperlipidemia Mother   . Breast cancer Mother   . Dementia Sister   . Hypertension Sister   . Hyperlipidemia Sister   .  Hyperlipidemia Brother   . Hypertension Brother   . Hypertension Son   . Dementia Maternal Aunt   . Breast cancer Maternal Aunt   . Cancer Paternal Uncle   . Alcohol abuse Paternal Uncle   . Stroke Maternal Grandmother   . Heart disease Paternal Grandmother   . Hyperlipidemia Sister   . Hypertension Sister   . Hyperlipidemia Sister   . Hypertension Sister   . Dementia Maternal Aunt   . Breast cancer Maternal Aunt   . Dementia Maternal Aunt   . Breast cancer Maternal Aunt   . Alcohol abuse Paternal Uncle     SOCHx:   reports that  she has never smoked. She has never used smokeless tobacco. She reports previous alcohol use. She reports that she does not use drugs.  ALLERGIES:  Allergies  Allergen Reactions  . Lovaza [Omega-3-Acid Ethyl Esters] Diarrhea  . Minocycline Diarrhea, Nausea And Vomiting and Other (See Comments)    Stomach pain    . Pantoprazole Swelling  . Sulfa Antibiotics Swelling  . Rosuvastatin Swelling  . Amoxicillin-Pot Clavulanate Diarrhea  . Lipitor [Atorvastatin] Other (See Comments)    Diarrhea and insomnia  . Pravastatin Diarrhea    ROS: Pertinent items noted in HPI and remainder of comprehensive ROS otherwise negative.  HOME MEDS: Current Outpatient Medications on File Prior to Visit  Medication Sig Dispense Refill  . aspirin 81 MG tablet Take 81 mg by mouth daily.    . Cyanocobalamin (B-12 PO) Take 1 mL by mouth daily.     Marland Kitchen estradiol (ESTRACE) 0.5 MG tablet TAKE ONE-HALF TABLET BY  MOUTH DAILY (Patient taking differently: Take 0.25 mg by mouth daily.) 45 tablet 3  . fluticasone (FLONASE) 50 MCG/ACT nasal spray USE 2 SPRAYS IN EACH  NOSTRIL DAILY 48 g 3  . gabapentin (NEURONTIN) 600 MG tablet 1 po am, 1 po lunch and 2 po qhs (Patient taking differently: Take 600 mg by mouth See admin instructions. 1 po am, 1 po lunch and 2 po qhs) 450 tablet 3  . gemfibrozil (LOPID) 600 MG tablet Take one tablet by mouth two times daily before a meal. (Patient taking differently: Take 600 mg by mouth 2 (two) times daily before a meal. Take one tablet by mouth two times daily before a meal.) 180 tablet 3  . HYDROcodone-acetaminophen (NORCO) 10-325 MG tablet Take 1 tablet by mouth 3 (three) times daily.    . metoprolol tartrate (LOPRESSOR) 50 MG tablet TAKE 1 TABLET BY MOUTH  TWICE DAILY 180 tablet 1  . Multiple Vitamins-Minerals (MULTIVITAMIN WITH MINERALS) tablet Take 1 tablet by mouth daily.    Marland Kitchen omeprazole (PRILOSEC) 40 MG capsule Take 40 mg by mouth daily.    . Wheat Dextrin (BENEFIBER DRINK MIX PO)  Take by mouth daily. Pt takes 2 teaspoons in the morning.     No current facility-administered medications on file prior to visit.    LABS/IMAGING: No results found for this or any previous visit (from the past 48 hour(s)). No results found.  LIPID PANEL:    Component Value Date/Time   CHOL 185 07/31/2020 0909   TRIG 75 07/31/2020 0909   HDL 60 07/31/2020 0909   CHOLHDL 3.1 07/31/2020 0909   CHOLHDL 5.0 (H) 10/22/2019 1705   VLDL 35 (H) 05/24/2016 0843   LDLCALC 111 (H) 07/31/2020 0909   LDLCALC 140 (H) 10/22/2019 1705     WEIGHTS: Wt Readings from Last 3 Encounters:  08/05/20 130 lb (59 kg)  06/24/20 130  lb 12.8 oz (59.3 kg)  03/21/20 136 lb 3.2 oz (61.8 kg)    VITALS: BP (!) 147/65   Pulse (!) 57   Ht 5' (1.524 m)   Wt 130 lb (59 kg)   SpO2 99%   BMI 25.39 kg/m   EXAM: General appearance: alert and no distress Neck: no carotid bruit, no JVD and thyroid not enlarged, symmetric, no tenderness/mass/nodules Lungs: clear to auscultation bilaterally Heart: regular rate and rhythm Abdomen: soft, non-tender; bowel sounds normal; no masses,  no organomegaly Extremities: extremities normal, atraumatic, no cyanosis or edema Pulses: 2+ and symmetric Skin: Skin color, texture, turgor normal. No rashes or lesions Neurologic: Grossly normal Psych: Pleasant  EKG: Sinus bradycardia first-degree AV block at 57-personally reviewed  ASSESSMENT: 1. NSTEMI, suspected perioperative MI 2. Dyslipidemia with statin/zetia intolerance 3. Hypertension  PLAN: 1.   Ms. Adams could not tolerate statins or ezetimibe.  LDL has come down but still remained above target.  She is not interested in any other medications or therapy for her cholesterol.  I do feel like she is at persistent risk of another cardiovascular event but she says she "feels fine".  Based on that she would like to follow-up with me as needed.  Pixie Casino, MD, [facility], [place]  Director of the Advanced Lipid Disorders &  Cardiovascular Risk Reduction Clinic Diplomate of the American Board of Clinical Lipidology Attending Cardiologist  Direct Dial: [phone]  Fax: [phone]  Website:  [email or web address] 08/05/2020, 11:41 AM

## 2020-08-11 ENCOUNTER — Other Ambulatory Visit: Payer: Self-pay | Admitting: Osteopathic Medicine

## 2020-08-11 ENCOUNTER — Other Ambulatory Visit: Payer: Self-pay | Admitting: Sports Medicine

## 2020-09-01 ENCOUNTER — Telehealth: Payer: Self-pay

## 2020-09-01 NOTE — Telephone Encounter (Signed)
Pt called requesting antibiotics for UTI symptoms. Per pt, she started having symptoms yesterday. Pt is due to leave town on Thursday. Please contact the pt for scheduling. Thanks in advance.

## 2020-09-01 NOTE — Telephone Encounter (Signed)
LVM for patient to call back to get appt scheduled. AM

## 2020-09-03 ENCOUNTER — Other Ambulatory Visit: Payer: Self-pay

## 2020-09-03 ENCOUNTER — Ambulatory Visit (INDEPENDENT_AMBULATORY_CARE_PROVIDER_SITE_OTHER): Payer: Medicare Other | Admitting: Osteopathic Medicine

## 2020-09-03 ENCOUNTER — Encounter: Payer: Self-pay | Admitting: Osteopathic Medicine

## 2020-09-03 VITALS — BP 150/80 | HR 51 | Temp 98.6°F | Wt 125.1 lb

## 2020-09-03 DIAGNOSIS — R634 Abnormal weight loss: Secondary | ICD-10-CM | POA: Diagnosis not present

## 2020-09-03 DIAGNOSIS — K5904 Chronic idiopathic constipation: Secondary | ICD-10-CM | POA: Diagnosis not present

## 2020-09-03 DIAGNOSIS — R300 Dysuria: Secondary | ICD-10-CM | POA: Diagnosis not present

## 2020-09-03 DIAGNOSIS — I10 Essential (primary) hypertension: Secondary | ICD-10-CM | POA: Diagnosis not present

## 2020-09-03 MED ORDER — CIPROFLOXACIN HCL 500 MG PO TABS
500.0000 mg | ORAL_TABLET | Freq: Two times a day (BID) | ORAL | 0 refills | Status: AC
Start: 2020-09-03 — End: 2020-09-08

## 2020-09-03 NOTE — Patient Instructions (Signed)
CONSTIPATION:  ALL THE TIME:  Lifestyle measures  Hydration: drink plenty of water  Physical activity: at least walking daily  High-fiber foods Bulk forming laxatives   Psyllium (Konsyl; Metamucil; Perdiem)  Methylcellulose (Citrucel)  Calcium polycarbophil (FiberCon; Fiber-Lax; Mitrolan)  Wheat dextrin (Benefiber)  AS NEEDED FOR 3-5 DAYS AT A TIME OR ALL THE TIME 1-2 TIMES PER WEEK  (CAN TAKE ONE FROM EACH CATEGORY E.G. MIRALAX + SENNA) Hyperosmolar or saline laxatives:  Polyethylene glycol (MiraLAX, GlycoLax)  Magnesium hydroxide (Milk of Magnesia)   Magnesium citrate (Evac-Q-Mag)  Stimulant laxatives  Senna (eg, Black Draught, Ex-Lax, Fletcher's, Castoria, Senokot)   Bisacodyl (eg, Correctol, Doxidan, Dulcolax). Taking stimulant laxatives regularly or in large amounts can cause side effects, including low potassium levels. Thus, you should take these drugs carefully if you must use them regularly.  PRESCRIPTIONS that treat severe constipation. They are expensive, but may be recommended if you do not respond to other treatments.

## 2020-09-03 NOTE — Progress Notes (Signed)
Cynthia Adams is a 75 y.o. female who presents to  [place] at [facility]  today, 09/03/20, seeking care for the following:  . UTI symptoms - (+)dysuria and frequency x 4 days, no abd pain/ nausea  . BP - following w/ cardiology who's been changing her Rx. No CP/SOB.  . Concern for constipation, has been taking fiber supplements, occasional some sort of "plant based" OTC laxative she's not sure name . Weight decreasing past year, no pain, no severe fatigue, isn't exercising as frequenrtly     Wt Readings from Last 3 Encounters:  09/03/20 125 lb 1.9 oz (56.8 kg)  08/05/20 130 lb (59 kg)  06/24/20 130 lb 12.8 oz (59.3 kg)     ASSESSMENT & PLAN with other pertinent findings:  The primary encounter diagnosis was Dysuria. Diagnoses of Chronic idiopathic constipation, Essential hypertension, and Unintended weight loss were also pertinent to this visit.   1. Dysuria Unable to provide specimen today -->abx AFTER providing specimen to lab   2. Chronic idiopathic constipation See below Increase hydration and activity Consider Rx if no better   3. Essential hypertension BP Readings from Last 3 Encounters:  09/03/20 (!) 150/80  08/05/20 (!) 147/65  06/24/20 130/77  Multiple Rx side effects Continue current regimen  4. Unintended weight loss Pt would like to defer labs for now No red flag symptoms Likely loss muscle mass d/t sedentary  Follow closely   Patient Instructions  CONSTIPATION:  ALL THE TIME:  Lifestyle measures  Hydration: drink plenty of water  Physical activity: at least walking daily  High-fiber foods Bulk forming laxatives   Psyllium (Konsyl; Metamucil; Perdiem)  Methylcellulose (Citrucel)  Calcium polycarbophil (FiberCon; Fiber-Lax; Mitrolan)  Wheat dextrin (Benefiber)  AS NEEDED FOR 3-5 DAYS AT A TIME OR ALL THE TIME 1-2 TIMES PER WEEK  (CAN TAKE ONE FROM EACH CATEGORY E.G. MIRALAX +  SENNA) Hyperosmolar or saline laxatives:  Polyethylene glycol (MiraLAX, GlycoLax)  Magnesium hydroxide (Milk of Magnesia)   Magnesium citrate (Evac-Q-Mag)  Stimulant laxatives  Senna (eg, Black Draught, Ex-Lax, Fletcher's, [place], Senokot)   Bisacodyl (eg, Correctol, Doxidan, Dulcolax). Taking stimulant laxatives regularly or in large amounts can cause side effects, including low potassium levels. Thus, you should take these drugs carefully if you must use them regularly.  PRESCRIPTIONS that treat severe constipation. They are expensive, but may be recommended if you do not respond to other treatments.     Orders Placed This Encounter  Procedures  . Urine Culture  . Urinalysis, Routine w reflex microscopic  . POCT URINALYSIS DIP (CLINITEK)    Meds ordered this encounter  Medications  . ciprofloxacin (CIPRO) 500 MG tablet    Sig: Take 1 tablet (500 mg total) by mouth 2 (two) times daily for 5 days.    Dispense:  10 tablet    Refill:  0     See below for relevant physical exam findings  See below for recent lab and imaging results reviewed  Medications, allergies, PMH, PSH, SocH, [place] reviewed below    Follow-up instructions: Return in about 6 weeks (around 10/15/2020) for MONITOR WEIGHT - IF STILL DROPPING WEIGHT WILL GET BLOOD WORK .                                        Exam:  BP (!) 151/55 (BP Location: Left Arm, Patient Position: Sitting,  Cuff Size: Normal)   Pulse (!) 51   Temp 98.6 F (37 C) (Oral)   Wt 125 lb 1.9 oz (56.8 kg)   BMI 24.44 kg/m   Constitutional: VS see above. General Appearance: alert, well-developed, well-nourished, NAD  Neck: No masses, trachea midline.   Respiratory: Normal respiratory effort. no wheeze, no rhonchi, no rales  Cardiovascular: S1/S2 normal, no murmur, no rub/gallop auscultated. RRR.   Musculoskeletal: Gait normal. Symmetric and independent movement of all  extremities  Abdominal: non-tender, non-distended  Neurological: Normal balance/coordination. No tremor.  Skin: warm, dry, intact.   Psychiatric: Normal judgment/insight. Normal mood and affect. Oriented x3.   Current Meds  Medication Sig  . aspirin 81 MG tablet Take 81 mg by mouth daily.  . Cyanocobalamin (B-12 PO) Take 1 mL by mouth daily.   Marland Kitchen estradiol (ESTRACE) 0.5 MG tablet TAKE ONE-HALF TABLET BY  MOUTH DAILY (Patient taking differently: Take 0.25 mg by mouth daily.)  . fluticasone (FLONASE) 50 MCG/ACT nasal spray USE 2 SPRAYS IN EACH  NOSTRIL DAILY  . gabapentin (NEURONTIN) 600 MG tablet TAKE 1 TABLET BY MOUTH IN  THE MORNING, 1 TABLET AT  LUNCH, AND 2 TABLETS EVERY  NIGHT AT BEDTIME  . gemfibrozil (LOPID) 600 MG tablet TAKE 1 TABLET BY MOUTH  TWICE DAILY BEFORE MEALS  . metoprolol tartrate (LOPRESSOR) 50 MG tablet TAKE 1 TABLET BY MOUTH  TWICE DAILY  . Multiple Vitamins-Minerals (MULTIVITAMIN WITH MINERALS) tablet Take 1 tablet by mouth daily.  Marland Kitchen omeprazole (PRILOSEC) 40 MG capsule Take 40 mg by mouth daily.  . Wheat Dextrin (BENEFIBER DRINK MIX PO) Take by mouth daily. Pt takes 2 teaspoons in the morning.  . [DISCONTINUED] HYDROcodone-acetaminophen (NORCO) 10-325 MG tablet Take 1 tablet by mouth 3 (three) times daily.    Allergies  Allergen Reactions  . Lovaza [Omega-3-Acid Ethyl Esters] Diarrhea  . Minocycline Diarrhea, Nausea And Vomiting and Other (See Comments)    Stomach pain    . Pantoprazole Swelling  . Sulfa Antibiotics Swelling  . Amoxicillin   . Other   . Rosuvastatin Swelling  . Amoxicillin-Pot Clavulanate Diarrhea  . Lipitor [Atorvastatin] Other (See Comments)    Diarrhea and insomnia  . Pravastatin Diarrhea    Patient Active Problem List   Diagnosis Date Noted  . Troponin level elevated 02/14/2020  . Urinary retention 02/14/2020  . AKI (acute kidney injury) ([place]) 02/14/2020  . Weakness of both legs 02/14/2020  . Leukocytosis 02/14/2020  .  Elevated troponin   . Diverticulosis 04/17/2018  . Seborrheic dermatitis 09/20/2017  . Dysphagia 06/02/2016  . Cyst of left breast 06/13/2014  . Spinal stenosis of lumbar region 06/13/2014  . Bilateral renal cysts 06/13/2014  . History of iron deficiency 06/13/2014  . Essential hypertension 05/13/2014  . Hyperlipidemia 05/13/2014  . Osteopenia 05/13/2014  . GERD (gastroesophageal reflux disease) 05/13/2014  . Prediabetes 05/13/2014    Family History  Problem Relation Age of Onset  . Cancer Mother        breast  . Dementia Mother   . Hypertension Mother   . Hyperlipidemia Mother   . Breast cancer Mother   . Dementia Sister   . Hypertension Sister   . Hyperlipidemia Sister   . Hyperlipidemia Brother   . Hypertension Brother   . Hypertension Son   . Dementia Maternal Aunt   . Breast cancer Maternal Aunt   . Cancer Paternal Uncle   . Alcohol abuse Paternal Uncle   . Stroke Maternal Grandmother   .  Heart disease Paternal Grandmother   . Hyperlipidemia Sister   . Hypertension Sister   . Hyperlipidemia Sister   . Hypertension Sister   . Dementia Maternal Aunt   . Breast cancer Maternal Aunt   . Dementia Maternal Aunt   . Breast cancer Maternal Aunt   . Alcohol abuse Paternal Uncle     Social History   Tobacco Use  Smoking Status Never Smoker  Smokeless Tobacco Never Used    Past Surgical History:  Procedure Laterality Date  . ABDOMINAL HYSTERECTOMY    . BREAST BIOPSY    . BREAST CYST EXCISION Right   . CATARACT EXTRACTION W/ INTRAOCULAR LENS  IMPLANT, BILATERAL Bilateral 01-2016, 02-2016  . CATARACT EXTRACTION W/ INTRAOCULAR LENS & ANTERIOR VITRECTOMY Bilateral 01-29-2016, 02-19-2016   Right eye in 01-2016 and Left eye 02-2016  . CERVICAL FUSION  02/11/2020  . CESAREAN SECTION    . CESAREAN SECTION    . CHOLECYSTECTOMY    . LUMBAR DISC SURGERY    . TONSILLECTOMY      Immunization History  Administered Date(s) Administered  . Fluad Quad(high Dose 65+)  12/26/2018, 03/05/2020  . Influenza, High Dose Seasonal PF 02/26/2016, 01/25/2018  . Influenza-Unspecified 02/04/2012, 01/09/2015, 02/21/2017  . PFIZER(Purple Top)SARS-COV-2 Vaccination 07/09/2019, 08/07/2019  . Pneumococcal Conjugate-13 02/26/2016  . Pneumococcal Polysaccharide-23 05/08/2006, 05/25/2017  . Td 05/10/2004  . Tdap 08/16/2014  . Zoster 03/17/2015  . Zoster Recombinat (Shingrix) 07/28/2017, 10/08/2017    Recent Results (from the past 2160 hour(s))  Lipid panel     Status: Abnormal   Collection Time: 06/19/20  9:05 AM  Result Value Ref Range   Cholesterol, Total 221 (H) 100 - 199 mg/dL   Triglycerides 89 0 - 149 mg/dL   HDL 49 >39 mg/dL   VLDL Cholesterol Cal 16 5 - 40 mg/dL   LDL Chol Calc (NIH) 156 (H) 0 - 99 mg/dL   Chol/HDL Ratio 4.5 (H) 0.0 - 4.4 ratio    Comment:                                   T. Chol/HDL Ratio                                             Men  Women                               1/2 Avg.Risk  3.4    3.3                                   Avg.Risk  5.0    4.4                                2X Avg.Risk  9.6    7.1                                3X Avg.Risk 23.4   11.0   Lipid panel     Status: Abnormal   Collection Time: 07/31/20  9:09 AM  Result Value Ref  Range   Cholesterol, Total 185 100 - 199 mg/dL   Triglycerides 75 0 - 149 mg/dL   HDL 60 >39 mg/dL   VLDL Cholesterol Cal 14 5 - 40 mg/dL   LDL Chol Calc (NIH) 111 (H) 0 - 99 mg/dL   Chol/HDL Ratio 3.1 0.0 - 4.4 ratio    Comment:                                   T. Chol/HDL Ratio                                             Men  Women                               1/2 Avg.Risk  3.4    3.3                                   Avg.Risk  5.0    4.4                                2X Avg.Risk  9.6    7.1                                3X Avg.Risk 23.4   11.0     No results found.     All questions at time of visit were answered - patient instructed to contact office with any additional  concerns or updates. ER/RTC precautions were reviewed with the patient as applicable.   Please note: manual typing as well as voice recognition software may have been used to produce this document - typos may escape review. Please contact Dr. Sheppard Coil for any needed clarifications.

## 2020-09-05 LAB — URINALYSIS, ROUTINE W REFLEX MICROSCOPIC
Bilirubin Urine: NEGATIVE
Glucose, UA: NEGATIVE
Ketones, ur: NEGATIVE
Nitrite: NEGATIVE
Specific Gravity, Urine: 1.007 (ref 1.001–1.035)
Squamous Epithelial / HPF: NONE SEEN /HPF (ref <=5)
pH: 6 (ref 5.0–8.0)

## 2020-09-05 LAB — URINE CULTURE
MICRO NUMBER:: 11822590
SPECIMEN QUALITY:: ADEQUATE

## 2020-09-05 LAB — MICROSCOPIC MESSAGE

## 2020-10-15 ENCOUNTER — Other Ambulatory Visit: Payer: Self-pay

## 2020-10-15 ENCOUNTER — Encounter: Payer: Self-pay | Admitting: Osteopathic Medicine

## 2020-10-15 ENCOUNTER — Ambulatory Visit (INDEPENDENT_AMBULATORY_CARE_PROVIDER_SITE_OTHER): Payer: Medicare Other | Admitting: Osteopathic Medicine

## 2020-10-15 VITALS — BP 138/69 | HR 51 | Temp 98.2°F | Wt 126.1 lb

## 2020-10-15 DIAGNOSIS — R634 Abnormal weight loss: Secondary | ICD-10-CM | POA: Diagnosis not present

## 2020-10-15 MED ORDER — METOPROLOL TARTRATE 50 MG PO TABS
50.0000 mg | ORAL_TABLET | Freq: Two times a day (BID) | ORAL | 3 refills | Status: DC
Start: 2020-10-15 — End: 2020-10-15

## 2020-10-15 MED ORDER — METOPROLOL TARTRATE 50 MG PO TABS
50.0000 mg | ORAL_TABLET | Freq: Two times a day (BID) | ORAL | 3 refills | Status: DC
Start: 2020-10-15 — End: 2021-11-11

## 2020-10-15 NOTE — Progress Notes (Signed)
Cynthia Adams is a 75 y.o. female who presents to  [place] at [facility]  today, 10/15/20, seeking care for the following:  . Weight check - following up from last visit 6 weeks ago, she reported some unintentional weight loss, declined labs at that time and since no red flag symptoms or severe drop in weight we felt ok to monitor. She reports today she feels fine, no concerns   Wt Readings:  10/15/20  126 lb, Body mass index is 24.63 kg/m.  09/03/20 125 lb 1.9 oz (56.8 kg)  08/05/20 130 lb (59 kg)  06/24/20 130 lb 12.8 oz (59.3 kg)      ASSESSMENT & PLAN with other pertinent findings:  The encounter diagnosis was Unintended weight loss - resolved / stable weight .    There are no Patient Instructions on file for this visit.  No orders of the defined types were placed in this encounter.   Meds ordered this encounter  Medications  . metoprolol tartrate (LOPRESSOR) 50 MG tablet    Sig: Take 1 tablet (50 mg total) by mouth 2 (two) times daily.    Dispense:  180 tablet    Refill:  3    Requesting 1 year supply     See below for relevant physical exam findings  See below for recent lab and imaging results reviewed  Medications, allergies, PMH, PSH, SocH, [place] reviewed below    Follow-up instructions: Return in about 6 months (around 04/16/2021) for ANNUAL (call week prior to visit for lab orders), SEE [place] SOONER IF NEEDED. CALL/MESSAGE W/ QUESTIONS.                                        Exam:  BP 138/69 (BP Location: Left Arm, Patient Position: Sitting, Cuff Size: Normal)   Pulse (!) 51   Temp 98.2 F (36.8 C) (Oral)   Wt 126 lb 1.9 oz (57.2 kg)   BMI 24.63 kg/m   Constitutional: VS see above. General Appearance: alert, well-developed, well-nourished, NAD  Neck: No masses, trachea midline.   Respiratory: Normal respiratory effort. no wheeze, no rhonchi, no rales  Cardiovascular:  S1/S2 normal, no murmur, no rub/gallop auscultated. RRR.   Musculoskeletal: Gait normal. Symmetric and independent movement of all extremities  Neurological: Normal balance/coordination. No tremor.  Skin: warm, dry, intact.   Psychiatric: Normal judgment/insight. Normal mood and affect. Oriented x3.   Current Meds  Medication Sig  . aspirin 81 MG tablet Take 81 mg by mouth daily.  . Cyanocobalamin (B-12 PO) Take 1 mL by mouth daily.   Marland Kitchen estradiol (ESTRACE) 0.5 MG tablet TAKE ONE-HALF TABLET BY  MOUTH DAILY (Patient taking differently: Take 0.25 mg by mouth daily.)  . fluticasone (FLONASE) 50 MCG/ACT nasal spray USE 2 SPRAYS IN EACH  NOSTRIL DAILY  . gabapentin (NEURONTIN) 600 MG tablet TAKE 1 TABLET BY MOUTH IN  THE MORNING, 1 TABLET AT  LUNCH, AND 2 TABLETS EVERY  NIGHT AT BEDTIME  . gemfibrozil (LOPID) 600 MG tablet TAKE 1 TABLET BY MOUTH  TWICE DAILY BEFORE MEALS  . Multiple Vitamins-Minerals (MULTIVITAMIN WITH MINERALS) tablet Take 1 tablet by mouth daily.  Marland Kitchen omeprazole (PRILOSEC) 40 MG capsule Take 40 mg by mouth daily.  . Wheat Dextrin (BENEFIBER DRINK MIX PO) Take by mouth daily. Pt takes 2 teaspoons in the morning.  . [DISCONTINUED] metoprolol tartrate (  LOPRESSOR) 50 MG tablet TAKE 1 TABLET BY MOUTH  TWICE DAILY    Allergies  Allergen Reactions  . Lovaza [Omega-3-Acid Ethyl Esters] Diarrhea  . Minocycline Diarrhea, Nausea And Vomiting and Other (See Comments)    Stomach pain    . Pantoprazole Swelling  . Sulfa Antibiotics Swelling  . Amoxicillin   . Other   . Rosuvastatin Swelling  . Amoxicillin-Pot Clavulanate Diarrhea  . Lipitor [Atorvastatin] Other (See Comments)    Diarrhea and insomnia  . Pravastatin Diarrhea    Patient Active Problem List   Diagnosis Date Noted  . Troponin level elevated 02/14/2020  . Urinary retention 02/14/2020  . AKI (acute kidney injury) ([place]) 02/14/2020  . Weakness of both legs 02/14/2020  . Leukocytosis 02/14/2020  . Elevated  troponin   . Diverticulosis 04/17/2018  . Seborrheic dermatitis 09/20/2017  . Dysphagia 06/02/2016  . Cyst of left breast 06/13/2014  . Spinal stenosis of lumbar region 06/13/2014  . Bilateral renal cysts 06/13/2014  . History of iron deficiency 06/13/2014  . Essential hypertension 05/13/2014  . Hyperlipidemia 05/13/2014  . Osteopenia 05/13/2014  . GERD (gastroesophageal reflux disease) 05/13/2014  . Prediabetes 05/13/2014    Family History  Problem Relation Age of Onset  . Cancer Mother        breast  . Dementia Mother   . Hypertension Mother   . Hyperlipidemia Mother   . Breast cancer Mother   . Dementia Sister   . Hypertension Sister   . Hyperlipidemia Sister   . Hyperlipidemia Brother   . Hypertension Brother   . Hypertension Son   . Dementia Maternal Aunt   . Breast cancer Maternal Aunt   . Cancer Paternal Uncle   . Alcohol abuse Paternal Uncle   . Stroke Maternal Grandmother   . Heart disease Paternal Grandmother   . Hyperlipidemia Sister   . Hypertension Sister   . Hyperlipidemia Sister   . Hypertension Sister   . Dementia Maternal Aunt   . Breast cancer Maternal Aunt   . Dementia Maternal Aunt   . Breast cancer Maternal Aunt   . Alcohol abuse Paternal Uncle     Social History   Tobacco Use  Smoking Status Never Smoker  Smokeless Tobacco Never Used    Past Surgical History:  Procedure Laterality Date  . ABDOMINAL HYSTERECTOMY    . BREAST BIOPSY    . BREAST CYST EXCISION Right   . CATARACT EXTRACTION W/ INTRAOCULAR LENS  IMPLANT, BILATERAL Bilateral 01-2016, 02-2016  . CATARACT EXTRACTION W/ INTRAOCULAR LENS & ANTERIOR VITRECTOMY Bilateral 01-29-2016, 02-19-2016   Right eye in 01-2016 and Left eye 02-2016  . CERVICAL FUSION  02/11/2020  . CESAREAN SECTION    . CESAREAN SECTION    . CHOLECYSTECTOMY    . LUMBAR DISC SURGERY    . TONSILLECTOMY      Immunization History  Administered Date(s) Administered  . Fluad Quad(high Dose 65+) 12/26/2018,  03/05/2020  . Influenza, High Dose Seasonal PF 02/26/2016, 01/25/2018  . Influenza-Unspecified 02/04/2012, 01/09/2015, 02/21/2017  . PFIZER(Purple Top)SARS-COV-2 Vaccination 07/09/2019, 08/07/2019  . Pneumococcal Conjugate-13 02/26/2016  . Pneumococcal Polysaccharide-23 05/08/2006, 05/25/2017  . Td 05/10/2004  . Tdap 08/16/2014  . Zoster Recombinat (Shingrix) 07/28/2017, 10/08/2017  . Zoster, Live 03/17/2015    Recent Results (from the past 2160 hour(s))  Lipid panel     Status: Abnormal   Collection Time: 07/31/20  9:09 AM  Result Value Ref Range   Cholesterol, Total 185 100 - 199 mg/dL   Triglycerides  75 0 - 149 mg/dL   HDL 60 >39 mg/dL   VLDL Cholesterol Cal 14 5 - 40 mg/dL   LDL Chol Calc (NIH) 111 (H) 0 - 99 mg/dL   Chol/HDL Ratio 3.1 0.0 - 4.4 ratio    Comment:                                   T. Chol/HDL Ratio                                             Men  Women                               1/2 Avg.Risk  3.4    3.3                                   Avg.Risk  5.0    4.4                                2X Avg.Risk  9.6    7.1                                3X Avg.Risk 23.4   11.0   Urinalysis, Routine w reflex microscopic     Status: Abnormal   Collection Time: 09/03/20  1:22 PM  Result Value Ref Range   Color, Urine YELLOW YELLOW   APPearance CLOUDY (A) CLEAR   Specific Gravity, Urine 1.007 1.001 - 1.035   pH 6.0 5.0 - 8.0   Glucose, UA NEGATIVE NEGATIVE   Bilirubin Urine NEGATIVE NEGATIVE   Ketones, ur NEGATIVE NEGATIVE   Hgb urine dipstick 1+ (A) NEGATIVE   Protein, ur TRACE (A) NEGATIVE   Nitrite NEGATIVE NEGATIVE   Leukocytes,Ua 3+ (A) NEGATIVE   WBC, UA 40-60 (A) 0 - 5 /HPF   RBC / HPF 0-2 0 - 2 /HPF   Squamous Epithelial / LPF NONE SEEN < OR = 5 /HPF   Bacteria, UA MANY (A) NONE SEEN /HPF   Hyaline Cast 6-10 (A) NONE SEEN /LPF  Urine Culture     Status: Abnormal   Collection Time: 09/03/20  1:22 PM   Specimen: Urine  Result Value Ref Range   MICRO  NUMBER: [id number]    SPECIMEN QUALITY: Adequate    Sample Source URINE    STATUS: FINAL    ISOLATE 1: Escherichia coli (A)     Comment: Greater than 100,000 CFU/mL of Escherichia coli      Susceptibility   Escherichia coli - URINE CULTURE, REFLEX    AMOX/CLAVULANIC <=2 Sensitive     AMPICILLIN >=32 Resistant     AMPICILLIN/SULBACTAM <=2 Sensitive     CEFAZOLIN* <=4 Not Reportable      * For infections other than uncomplicated UTIcaused by E. coli, K. pneumoniae or P. mirabilis:Cefazolin is resistant if MIC > or = 8 mcg/mL.(Distinguishing susceptible versus intermediatefor isolates with MIC < or = 4 mcg/mL requiresadditional testing.)For uncomplicated UTI caused by E. coli,K. pneumoniae or P. mirabilis: Cefazolin issusceptible if  MIC <32 mcg/mL and predictssusceptible to the oral agents cefaclor, cefdinir,cefpodoxime, cefprozil, cefuroxime, cephalexinand loracarbef.    CEFEPIME <=1 Sensitive     CEFTRIAXONE <=1 Sensitive     CIPROFLOXACIN <=0.25 Sensitive     LEVOFLOXACIN 0.25 Sensitive     ERTAPENEM <=0.5 Sensitive     GENTAMICIN <=1 Sensitive     IMIPENEM <=0.25 Sensitive     NITROFURANTOIN <=16 Sensitive     PIP/TAZO <=4 Sensitive     TOBRAMYCIN <=1 Sensitive     TRIMETH/SULFA* <=20 Sensitive      * For infections other than uncomplicated UTIcaused by E. coli, K. pneumoniae or P. mirabilis:Cefazolin is resistant if MIC > or = 8 mcg/mL.(Distinguishing susceptible versus intermediatefor isolates with MIC < or = 4 mcg/mL requiresadditional testing.)For uncomplicated UTI caused by E. coli,K. pneumoniae or P. mirabilis: Cefazolin issusceptible if MIC <32 mcg/mL and predictssusceptible to the oral agents cefaclor, cefdinir,cefpodoxime, cefprozil, cefuroxime, cephalexinand loracarbef.Legend:S = Susceptible  I = IntermediateR = Resistant  NS = Not susceptible* = Not tested  NR = Not reported**NN = See antimicrobic comments  MICROSCOPIC MESSAGE     Status: None   Collection Time: 09/03/20  1:22 PM   Result Value Ref Range   Note      Comment: This urine was analyzed for the presence of WBC,  RBC, bacteria, casts, and other formed elements.  Only those elements seen were reported. . .     No results found.     All questions at time of visit were answered - patient instructed to contact office with any additional concerns or updates. ER/RTC precautions were reviewed with the patient as applicable.   Please note: manual typing as well as voice recognition software may have been used to produce this document - typos may escape review. Please contact Dr. Sheppard Coil for any needed clarifications.

## 2020-11-03 ENCOUNTER — Ambulatory Visit (INDEPENDENT_AMBULATORY_CARE_PROVIDER_SITE_OTHER): Payer: Medicare Other | Admitting: Family Medicine

## 2020-11-03 ENCOUNTER — Encounter: Payer: Self-pay | Admitting: Family Medicine

## 2020-11-03 ENCOUNTER — Other Ambulatory Visit: Payer: Self-pay

## 2020-11-03 VITALS — BP 145/69 | HR 61 | Temp 98.7°F | Resp 17

## 2020-11-03 DIAGNOSIS — N3090 Cystitis, unspecified without hematuria: Secondary | ICD-10-CM

## 2020-11-03 DIAGNOSIS — R300 Dysuria: Secondary | ICD-10-CM

## 2020-11-03 LAB — POCT URINALYSIS DIP (CLINITEK)
Bilirubin, UA: NEGATIVE
Glucose, UA: NEGATIVE mg/dL
Ketones, POC UA: NEGATIVE mg/dL
Nitrite, UA: NEGATIVE
POC PROTEIN,UA: NEGATIVE
Spec Grav, UA: <=1.005 — AB (ref 1.010–1.025)
Urobilinogen, UA: 0.2 E.U./dL
pH, UA: 5.5 (ref 5.0–8.0)

## 2020-11-03 NOTE — Patient Instructions (Signed)
Need to send you urine for culture to determine which bacteria and what antibiotic will be most effective.

## 2020-11-03 NOTE — Progress Notes (Signed)
Acute Office Visit  Subjective:    Patient ID: Cynthia Adams, female    DOB: 1946-02-28, 75 y.o.   MRN: [id number]  Chief Complaint  Patient presents with   Urinary Tract Infection    HPI Patient is in today for dysuria.   Patient started with urinary symptoms on Friday.  She is describing burning with urination, frequency, urgency, small voids.  She denies any changes to color, odor.  No hematuria, abdominal pain, back pain, suprapubic pain, flank pain, fever, nausea, vomiting, diarrhea, fevers.  Reports she had a UTI in April and did well with Cipro.  States she never really had troubles with UTIs until she had a hospitalization a few months ago and had to be catheterized. She has not yet tried anything at home for her symptoms.       Past Medical History:  Diagnosis Date   Cataract 2017   Cataract surgery in 01-2016 and 02-2016   GERD (gastroesophageal reflux disease)    History of iron deficiency 06/13/2014   Hyperlipidemia    Hypertension    Nerve pain    Seasonal allergies     Past Surgical History:  Procedure Laterality Date   ABDOMINAL HYSTERECTOMY     BREAST BIOPSY     BREAST CYST EXCISION Right    CATARACT EXTRACTION W/ INTRAOCULAR LENS  IMPLANT, BILATERAL Bilateral 01-2016, 02-2016   CATARACT EXTRACTION W/ INTRAOCULAR LENS & ANTERIOR VITRECTOMY Bilateral 01-29-2016, 02-19-2016   Right eye in 01-2016 and Left eye 02-2016   CERVICAL FUSION  02/11/2020   CESAREAN SECTION     CESAREAN SECTION     CHOLECYSTECTOMY     LUMBAR [place] SURGERY     TONSILLECTOMY      Family History  Problem Relation Age of Onset   Cancer Mother        breast   Dementia Mother    Hypertension Mother    Hyperlipidemia Mother    Breast cancer Mother    Dementia Sister    Hypertension Sister    Hyperlipidemia Sister    Hyperlipidemia Brother    Hypertension Brother    Hypertension Son    Dementia Maternal Aunt    Breast cancer Maternal Aunt    Cancer Paternal Uncle     Alcohol abuse Paternal Uncle    Stroke Maternal Grandmother    Heart disease Paternal Grandmother    Hyperlipidemia Sister    Hypertension Sister    Hyperlipidemia Sister    Hypertension Sister    Dementia Maternal Aunt    Breast cancer Maternal Aunt    Dementia Maternal Aunt    Breast cancer Maternal Aunt    Alcohol abuse Paternal Uncle     Social History   Socioeconomic History   Marital status: Married    Spouse name: John   Number of children: 2   Years of education: 12   Highest education level: 12th grade  Occupational History   Occupation: Glass blower/designer    Comment: retired  Tobacco Use   Smoking status: Never   Smokeless tobacco: Never  Vaping Use   Vaping Use: Never used  Substance and Sexual Activity   Alcohol use: Not Currently    Comment: may have a glass of wine but very, very seldom.   Drug use: Never   Sexual activity: Not Currently  Other Topics Concern   Not on file  Social History Narrative   Goes out for birthdays, meets with class of 66 every month. Wants to  get back into exercising. Works on one day of the week   Social Determinants of Radio broadcast assistant Strain: Not on file  Food Insecurity: Not on file  Transportation Needs: Not on file  Physical Activity: Not on file  Stress: Not on file  Social Connections: Not on file  Intimate Partner Violence: Not on file    Outpatient Medications Prior to Visit  Medication Sig Dispense Refill   aspirin 81 MG tablet Take 81 mg by mouth daily.     Cyanocobalamin (B-12 PO) Take 1 mL by mouth daily.      estradiol (ESTRACE) 0.5 MG tablet TAKE ONE-HALF TABLET BY  MOUTH DAILY (Patient taking differently: Take 0.25 mg by mouth daily.) 45 tablet 3   fluticasone (FLONASE) 50 MCG/ACT nasal spray USE 2 SPRAYS IN EACH  NOSTRIL DAILY 48 g 3   gabapentin (NEURONTIN) 600 MG tablet TAKE 1 TABLET BY MOUTH IN  THE MORNING, 1 TABLET AT  LUNCH, AND 2 TABLETS EVERY  NIGHT AT BEDTIME 360 tablet 3   gemfibrozil  (LOPID) 600 MG tablet TAKE 1 TABLET BY MOUTH  TWICE DAILY BEFORE MEALS 180 tablet 3   metoprolol tartrate (LOPRESSOR) 50 MG tablet Take 1 tablet (50 mg total) by mouth 2 (two) times daily. 180 tablet 3   Multiple Vitamins-Minerals (MULTIVITAMIN WITH MINERALS) tablet Take 1 tablet by mouth daily.     omeprazole (PRILOSEC) 40 MG capsule Take 40 mg by mouth daily.     Wheat Dextrin (BENEFIBER DRINK MIX PO) Take by mouth daily. Pt takes 2 teaspoons in the morning.     No facility-administered medications prior to visit.    Allergies  Allergen Reactions   Lovaza [Omega-3-Acid Ethyl Esters] Diarrhea   Minocycline Diarrhea, Nausea And Vomiting and Other (See Comments)    Stomach pain     Pantoprazole Swelling   Sulfa Antibiotics Swelling   Amoxicillin    Other    Rosuvastatin Swelling   Amoxicillin-Pot Clavulanate Diarrhea   Lipitor [Atorvastatin] Other (See Comments)    Diarrhea and insomnia   Pravastatin Diarrhea    Review of Systems All review of systems negative except what is listed in the HPI     Objective:    Physical Exam Constitutional:      Appearance: Normal appearance.  Cardiovascular:     Rate and Rhythm: Regular rhythm.  Pulmonary:     Breath sounds: Normal breath sounds.  Abdominal:     General: Bowel sounds are normal. There is no distension.     Palpations: Abdomen is soft.     Tenderness: There is no abdominal tenderness. There is no right CVA tenderness, left CVA tenderness or guarding.  Skin:    General: Skin is warm and dry.  Neurological:     Mental Status: She is alert and oriented to person, place, and time.  Psychiatric:        Mood and Affect: Mood normal.        Behavior: Behavior normal.        Thought Content: Thought content normal.        Judgment: Judgment normal.    BP (!) 145/69   Pulse 61   Temp 98.7 F (37.1 C)   Resp 17   SpO2 99%  Wt Readings from Last 3 Encounters:  10/15/20 126 lb 1.9 oz (57.2 kg)  09/03/20 125 lb 1.9 oz  (56.8 kg)  08/05/20 130 lb (59 kg)    Health Maintenance Due  Topic Date  Due   COVID-19 Vaccine (3 - Pfizer risk series) 09/04/2019    There are no preventive care reminders to display for this patient.   Lab Results  Component Value Date   TSH 4.08 07/31/2019   Lab Results  Component Value Date   WBC 11.8 (H) 03/05/2020   HGB 10.6 (L) 03/05/2020   HCT 32.2 (L) 03/05/2020   MCV 93.1 03/05/2020   PLT 407 (H) 03/05/2020   Lab Results  Component Value Date   NA 138 03/05/2020   K 4.5 03/05/2020   CO2 26 03/05/2020   GLUCOSE 92 03/05/2020   BUN 17 03/05/2020   CREATININE 1.04 (H) 03/05/2020   BILITOT 0.3 03/05/2020   ALKPHOS 90 02/15/2020   AST 13 03/05/2020   ALT 8 03/05/2020   PROT 7.4 03/05/2020   ALBUMIN 4.3 02/15/2020   CALCIUM 10.1 03/05/2020   ANIONGAP 15 02/15/2020   Lab Results  Component Value Date   CHOL 185 07/31/2020   Lab Results  Component Value Date   HDL 60 07/31/2020   Lab Results  Component Value Date   LDLCALC 111 (H) 07/31/2020   Lab Results  Component Value Date   TRIG 75 07/31/2020   Lab Results  Component Value Date   CHOLHDL 3.1 07/31/2020   Lab Results  Component Value Date   HGBA1C 5.8 03/01/2020       Assessment & Plan:   1. Dysuria UA with small leuks and trace blood. Will send for culture. Recommend she stay hydrated, practice good hygiene, can try cranberry supplements. Declined pyridium. Will update her with results and any changes to plan of care.   - POCT URINALYSIS DIP (CLINITEK) - Urine Culture  Patient aware of signs/symptoms requiring further/urgent evaluation.  Follow-up if symptoms worsen or fail to improve.    Terrilyn Saver, NP

## 2020-11-05 ENCOUNTER — Telehealth: Payer: Self-pay

## 2020-11-05 MED ORDER — NITROFURANTOIN MONOHYD MACRO 100 MG PO CAPS: 100.0000 mg | ORAL_CAPSULE | Freq: Two times a day (BID) | ORAL | 0 refills | Status: DC

## 2020-11-05 NOTE — Telephone Encounter (Signed)
Still awaiting final culture results  Antibiotics sent to pharmacy If symptoms worsen or fail to improve, needs reevaluation

## 2020-11-05 NOTE — Telephone Encounter (Signed)
Patient has been updated of provider's recommendation. She will stop by the pharmacy to pick up the macrobid rx. Pt requests for clinic to call with any updates on the results for the urine culture.

## 2020-11-05 NOTE — Telephone Encounter (Signed)
Patient left multiple vm msgs. Requesting if NP Lovena Le has received any urinalysis results? Per pt, NP Lovena Le was waiting for results before sending the appropriate antibiotic. Patient states if something can be sent in because she is currently in pain. Pls advise, thanks.

## 2020-11-05 NOTE — Telephone Encounter (Signed)
No. Cipro is reserved for complicated UTI or resistant UTI. Await final culture results.

## 2020-11-05 NOTE — Telephone Encounter (Signed)
Patient has been updated regarding antibiotic sent to [facility]. Patient requested if provider can send in ciprofloxacin instead of macrobid. Pls advise, thanks.

## 2020-11-06 LAB — URINE CULTURE
MICRO NUMBER:: 12056602
SPECIMEN QUALITY:: ADEQUATE

## 2020-11-06 MED ORDER — CIPROFLOXACIN HCL 250 MG PO TABS: 250.0000 mg | ORAL_TABLET | Freq: Two times a day (BID) | ORAL | 0 refills | Status: AC

## 2020-11-06 NOTE — Progress Notes (Signed)
Urine culture is back. Limited options based on your allergies so I am going to give you 3 days of Cipro. Sent to [facility]. Let us know if not improving after antibiotics.

## 2020-11-06 NOTE — Addendum Note (Signed)
Addended by: Caleen Jobs B on: 11/06/2020 09:48 AM   Modules accepted: Orders

## 2020-11-20 ENCOUNTER — Other Ambulatory Visit: Payer: Self-pay | Admitting: Osteopathic Medicine

## 2020-11-20 DIAGNOSIS — R232 Flushing: Secondary | ICD-10-CM

## 2021-02-13 ENCOUNTER — Ambulatory Visit (INDEPENDENT_AMBULATORY_CARE_PROVIDER_SITE_OTHER): Payer: Medicare Other | Admitting: Physician Assistant

## 2021-02-13 DIAGNOSIS — Z0000 Encounter for general adult medical examination without abnormal findings: Secondary | ICD-10-CM

## 2021-02-13 NOTE — Progress Notes (Signed)
MEDICARE ANNUAL WELLNESS VISIT  02/13/2021  Telephone Visit Disclaimer This Medicare AWV was conducted by telephone due to national recommendations for restrictions regarding the COVID-19 Pandemic (e.g. social distancing).  I verified, using two identifiers, that I am speaking with Cynthia Adams or their authorized healthcare agent. I discussed the limitations, risks, security, and privacy concerns of performing an evaluation and management service by telephone and the potential availability of an in-person appointment in the future. The patient expressed understanding and agreed to proceed.  Location of Patient: Home Location of Provider (nurse):  Provider home  Subjective:    Cynthia Adams is a 75 y.o. female patient of Emeterio Reeve, DO who had a Medicare Annual Wellness Visit today via telephone. [name] is Retired and lives with their spouse. she has 2 children. she reports that she is socially active and does interact with friends/family regularly. she is minimally physically active and enjoys reading.  Patient Care Team: Emeterio Reeve, DO as PCP - General (Family Medicine) Pixie Casino, MD as PCP - Cardiology (Cardiology)  Advanced Directives 02/13/2021 02/14/2020 08/13/2019 06/13/2018 05/28/2016 05/28/2016 08/16/2014  Does Patient Have a Medical Advance Directive? Yes Yes Yes Yes - Yes Yes  Type of Advance Directive [place];Living will [place];Living will [place];Living will [place];Living will [place];Living will [place];Living will Out of facility DNR (pink MOST or yellow form)  Does patient want to make changes to medical advance directive? No - Patient declined No - Patient declined - No - Patient declined - - No - Patient declined  Copy of [name] in Chart? No - copy requested No - copy requested - No - copy requested No - copy  requested No - copy requested Yes  Would patient like information on creating a medical advance directive? - - - - - - [facility] Utilization Over the Past 12 Months: # of hospitalizations or ER visits: 0 # of surgeries: 0 last surgery was February 11, 2020  Review of Systems    Patient reports that her overall health is better compared to last year.  History obtained from chart review and the patient  Patient Reported Readings (BP, Pulse, CBG, Weight, etc) none  Pain Assessment Pain : No/denies pain     Current Medications & Allergies (verified) Allergies as of 02/13/2021       Reactions   Lovaza [omega-3-acid Ethyl Esters] Diarrhea   Minocycline Diarrhea, Nausea And Vomiting, Other (See Comments)   Stomach pain   Pantoprazole Swelling   Sulfa Antibiotics Swelling   Amoxicillin    Other    Rosuvastatin Swelling   Amoxicillin-pot Clavulanate Diarrhea   Lipitor [atorvastatin] Other (See Comments)   Diarrhea and insomnia   Pravastatin Diarrhea        Medication List        Accurate as of February 13, 2021 10:03 AM. If you have any questions, ask your nurse or doctor.          aspirin 81 MG tablet Take 81 mg by mouth daily.   B-12 PO Take 1 mL by mouth daily.   BENEFIBER DRINK MIX PO Take by mouth daily. Pt takes 2 teaspoons in the morning.   estradiol 0.5 MG tablet Commonly known as: ESTRACE TAKE ONE-HALF TABLET BY  MOUTH DAILY   fluticasone 50 MCG/ACT nasal spray Commonly known as: FLONASE USE 2 SPRAYS IN [facility]  NOSTRIL DAILY What changed:  how much to take how to take this when to take this additional instructions   gabapentin 600 MG tablet Commonly known as: NEURONTIN TAKE 1 TABLET BY MOUTH IN  THE MORNING, 1 TABLET AT  LUNCH, AND 2 TABLETS EVERY  NIGHT AT BEDTIME   gemfibrozil 600 MG tablet Commonly known as: LOPID TAKE 1 TABLET BY MOUTH  TWICE DAILY BEFORE MEALS   HYDROcodone-acetaminophen 10-325 MG tablet Commonly known as:  NORCO Take by mouth.   metoprolol tartrate 50 MG tablet Commonly known as: LOPRESSOR Take 1 tablet (50 mg total) by mouth 2 (two) times daily.   metroNIDAZOLE 0.75 % cream Commonly known as: METROCREAM Apply topically.   multivitamin with minerals tablet Take 1 tablet by mouth daily.   nitrofurantoin (macrocrystal-monohydrate) 100 MG capsule Commonly known as: MACROBID Take 1 capsule (100 mg total) by mouth 2 (two) times daily.   omeprazole 40 MG capsule Commonly known as: PRILOSEC Take 40 mg by mouth daily.        History (reviewed): Past Medical History:  Diagnosis Date   Cataract 2017   Cataract surgery in 01-2016 and 02-2016   GERD (gastroesophageal reflux disease)    History of iron deficiency 06/13/2014   Hyperlipidemia    Hypertension    Nerve pain    Seasonal allergies    Past Surgical History:  Procedure Laterality Date   ABDOMINAL HYSTERECTOMY     BREAST BIOPSY     BREAST CYST EXCISION Right    CATARACT EXTRACTION W/ INTRAOCULAR LENS  IMPLANT, BILATERAL Bilateral 01-2016, 02-2016   CATARACT EXTRACTION W/ INTRAOCULAR LENS & ANTERIOR VITRECTOMY Bilateral 01-29-2016, 02-19-2016   Right eye in 01-2016 and Left eye 02-2016   CERVICAL FUSION  02/11/2020   CESAREAN SECTION     CESAREAN SECTION     CHOLECYSTECTOMY     LUMBAR [place] SURGERY     TONSILLECTOMY     Family History  Problem Relation Age of Onset   Adams Mother        breast   Dementia Mother    Hypertension Mother    Hyperlipidemia Mother    Breast Adams Mother    Dementia Sister    Hypertension Sister    Hyperlipidemia Sister    Hyperlipidemia Brother    Hypertension Brother    Hypertension Son    Dementia Maternal Aunt    Breast Adams Maternal Aunt    Adams Paternal Uncle    Alcohol abuse Paternal Uncle    Stroke Maternal Grandmother    Heart disease Paternal Grandmother    Hyperlipidemia Sister    Hypertension Sister    Hyperlipidemia Sister    Hypertension Sister     Dementia Maternal Aunt    Breast Adams Maternal Aunt    Dementia Maternal Aunt    Breast Adams Maternal Aunt    Alcohol abuse Paternal Uncle    Social History   Socioeconomic History   Marital status: Married    Spouse name: John   Number of children: 2   Years of education: 12   Highest education level: 12th grade  Occupational History   Occupation: Glass blower/designer    Comment: retired  Tobacco Use   Smoking status: Never   Smokeless tobacco: Never  Vaping Use   Vaping Use: Never used  Substance and Sexual Activity   Alcohol use: Not Currently    Comment: .   Drug use: Never   Sexual activity: Not Currently  Other Topics Concern  Not on file  Social History Narrative   Lives with her husband. She has two children. She enjoys reading.   Social Determinants of Health   Financial Resource Strain: Low Risk    Difficulty of Paying Living Expenses: Not hard at all  Food Insecurity: No Food Insecurity   Worried About Charity fundraiser in the Last Year: Never true   [place] in the Last Year: Never true  Transportation Needs: No Transportation Needs   Lack of Transportation (Medical): No   Lack of Transportation (Non-Medical): No  Physical Activity: Inactive   Days of Exercise per Week: 0 days   Minutes of Exercise per Session: 0 min  Stress: No Stress Concern Present   Feeling of Stress : Not at all  Social Connections: Socially Integrated   Frequency of Communication with Friends and Family: Three times a week   Frequency of Social Gatherings with Friends and Family: Once a week   Attends Religious Services: More than 4 times per year   Active Member of [employer] or Organizations: Yes   Attends Music therapist: More than 4 times per year   Marital Status: Married    Activities of Daily Living In your present state of health, do you have any difficulty performing the following activities: 02/13/2021  Hearing? N  Vision? N  Difficulty  concentrating or making decisions? Y  Comment has noticed some memory loss.  Walking or climbing stairs? N  Dressing or bathing? N  Doing errands, shopping? N  Preparing Food and eating ? N  Using the Toilet? N  In the past six months, have you accidently leaked urine? N  Do you have problems with loss of bowel control? N  Managing your Medications? N  Managing your Finances? N  Housekeeping or managing your Housekeeping? N  Some recent data might be hidden    Patient Education/ Literacy How often do you need to have someone help you when you read instructions, pamphlets, or other written materials from your doctor or pharmacy?: 1 - Never What is the last grade level you completed in school?: 12th grade  Exercise Current Exercise Habits: The patient does not participate in regular exercise at present, Exercise limited by: None identified  Diet Patient reports consuming 2 meals a day and 1-2 snack(s) a day Patient reports that her primary diet is: Regular Patient reports that she does have regular access to food.   Depression Screen PHQ 2/9 Scores 02/13/2021 07/31/2019 06/13/2018 09/27/2017 05/28/2016 05/28/2016 08/18/2015  PHQ - 2 Score 0 2 0 0 1 0 0  PHQ- 9 Score - 7 - - - - -     Fall Risk Fall Risk  02/13/2021 07/31/2019 06/13/2018 06/13/2018 09/27/2017  Falls in the past year? 0 0 0 0 No  Number falls in past yr: 0 - 0 - -  Injury with Fall? 0 - 0 - -  Comment - - - - -  Risk for fall due to : History of fall(s) - - - -  Follow up Falls evaluation completed;Education provided;Falls prevention discussed - Falls evaluation completed Falls prevention discussed -  Comment - - - - -     Objective:  Cynthia Adams seemed alert and oriented and she participated appropriately during our telephone visit.  Blood Pressure Weight BMI  BP Readings from Last 3 Encounters:  11/03/20 (!) 145/69  10/15/20 138/69  09/03/20 (!) 150/80   Wt Readings from Last 3 Encounters:  10/15/20  126 lb 1.9  oz (57.2 kg)  09/03/20 125 lb 1.9 oz (56.8 kg)  08/05/20 130 lb (59 kg)   BMI Readings from Last 1 Encounters:  10/15/20 24.63 kg/m    *Unable to obtain current vital signs, weight, and BMI due to telephone visit type  Hearing/Vision  Trivia did not seem to have difficulty with hearing/understanding during the telephone conversation Reports that she has had a formal eye exam by an eye care professional within the past year Reports that she has not had a formal hearing evaluation within the past year *Unable to fully assess hearing and vision during telephone visit type  Cognitive Function: 6CIT Screen 02/13/2021 06/13/2018 05/28/2016 05/28/2016  What Year? 0 points 0 points 0 points 0 points  What month? 0 points 0 points 0 points 0 points  What time? 0 points 0 points 0 points 0 points  Count back from 20 0 points 0 points 0 points 0 points  Months in reverse 0 points 0 points 0 points 0 points  Repeat phrase 0 points 0 points 0 points 0 points  Total Score 0 0 0 0   (Normal:0-7, Significant for Dysfunction: >8)  Normal Cognitive Function Screening: Yes   Immunization & Health Maintenance Record Immunization History  Administered Date(s) Administered   Fluad Quad(high Dose 65+) 12/26/2018, 03/05/2020   Influenza, High Dose Seasonal PF 02/26/2016, 01/25/2018   Influenza-Unspecified 02/04/2012, 01/09/2015, 02/21/2017   PFIZER(Purple Top)SARS-COV-2 Vaccination 07/09/2019, 08/07/2019, 04/18/2020, 12/23/2020   Pneumococcal Conjugate-13 02/26/2016   Pneumococcal Polysaccharide-23 05/08/2006, 05/25/2017   Td 05/10/2004   Tdap 08/16/2014   Zoster Recombinat (Shingrix) 07/28/2017, 10/08/2017   Zoster, Live 03/17/2015    Health Maintenance  Topic Date Due   INFLUENZA VACCINE  08/07/2021 (Originally 12/08/2020)   COVID-19 Vaccine (5 - Booster for [place] series) 04/24/2021   MAMMOGRAM  06/18/2022   TETANUS/TDAP  08/15/2024   COLONOSCOPY (Pts 45-82yrs Insurance coverage will need to be  confirmed)  10/02/2024   DEXA SCAN  Completed   Hepatitis C Screening  Completed   Zoster Vaccines- Shingrix  Completed   HPV VACCINES  Aged Out       Assessment  This is a routine wellness examination for [place].  Health Maintenance: Due or Overdue There are no preventive care reminders to display for this patient.   Cynthia Adams does not need a referral for Community Assistance: Care Management:   no Social Work:    no Prescription Assistance:  no Nutrition/Diabetes Education:  no   Plan:  Personalized Goals  Goals Addressed               This Visit's Progress     Patient Stated (pt-stated)        02/13/2021 AWV Goal: Exercise for General Health  Patient will verbalize understanding of the benefits of increased physical activity: Exercising regularly is important. It will improve your overall fitness, flexibility, and endurance. Regular exercise also will improve your overall health. It can help you control your weight, reduce stress, and improve your bone density. Over the next year, patient will increase physical activity as tolerated with a goal of at least 150 minutes of moderate physical activity per week.  You can tell that you are exercising at a moderate intensity if your heart starts beating faster and you start breathing faster but can still hold a conversation. Moderate-intensity exercise ideas include: Walking 1 mile (1.6 km) in about 15 minutes Biking Hiking Golfing Dancing Water aerobics Patient will verbalize  understanding of everyday activities that increase physical activity by providing examples like the following: Yard work, such as: Sales promotion account executive Gardening Washing windows or floors Patient will be able to explain general safety guidelines for exercising:  Before you start a new exercise program, talk with your health care provider. Do not  exercise so much that you hurt yourself, feel dizzy, or get very short of breath. Wear comfortable clothes and wear shoes with good support. Drink plenty of water while you exercise to prevent dehydration or heat stroke. Work out until your breathing and your heartbeat get faster.        Personalized Health Maintenance & Screening Recommendations  Influenza vaccine  Lung Adams Screening Recommended: no (Low Dose CT Chest recommended if Age 39-80 years, 30 pack-year currently smoking OR have quit w/in past 15 years) Hepatitis C Screening recommended: no HIV Screening recommended: no  Advanced Directives: Written information was not prepared per patient's request.  Referrals & Orders No orders of the defined types were placed in this encounter.   Follow-up Plan Follow-up with Emeterio Reeve, DO as planned Schedule your flu vaccine at your pharmacy or in-office. Medicare wellness visit in one year.  AVS printed and mailed to the patient.   I have personally reviewed and noted the following in the patient's chart:   Medical and social history Use of alcohol, tobacco or illicit drugs  Current medications and supplements Functional ability and status Nutritional status Physical activity Advanced directives List of other physicians Hospitalizations, surgeries, and ER visits in previous 12 months Vitals Screenings to include cognitive, depression, and falls Referrals and appointments  In addition, I have reviewed and discussed with Cynthia Adams certain preventive protocols, quality metrics, and best practice recommendations. A written personalized care plan for preventive services as well as general preventive health recommendations is available and can be mailed to the patient at her request.      Tinnie Gens, RN  02/13/2021

## 2021-02-13 NOTE — Patient Instructions (Addendum)
[place] Maintenance Summary and Written Plan of Care  [name] ,  Thank you for allowing me to perform your Medicare Annual Wellness Visit and for your ongoing commitment to your health.   Health Maintenance & Immunization History Health Maintenance  Topic Date Due   INFLUENZA VACCINE  08/07/2021 (Originally 12/08/2020)   COVID-19 Vaccine (5 - Booster for Parchment series) 04/24/2021   MAMMOGRAM  06/18/2022   TETANUS/TDAP  08/15/2024   COLONOSCOPY (Pts 45-107yrs Insurance coverage will need to be confirmed)  10/02/2024   DEXA SCAN  Completed   Hepatitis C Screening  Completed   Zoster Vaccines- Shingrix  Completed   HPV VACCINES  Aged Out   Immunization History  Administered Date(s) Administered   Fluad Quad(high Dose 65+) 12/26/2018, 03/05/2020   Influenza, High Dose Seasonal PF 02/26/2016, 01/25/2018   Influenza-Unspecified 02/04/2012, 01/09/2015, 02/21/2017   PFIZER(Purple Top)SARS-COV-2 Vaccination 07/09/2019, 08/07/2019, 04/18/2020, 12/23/2020   Pneumococcal Conjugate-13 02/26/2016   Pneumococcal Polysaccharide-23 05/08/2006, 05/25/2017   Td 05/10/2004   Tdap 08/16/2014   Zoster Recombinat (Shingrix) 07/28/2017, 10/08/2017   Zoster, Live 03/17/2015    These are the patient goals that we discussed:  Goals Addressed               This Visit's Progress     Patient Stated (pt-stated)        02/13/2021 AWV Goal: Exercise for General Health  Patient will verbalize understanding of the benefits of increased physical activity: Exercising regularly is important. It will improve your overall fitness, flexibility, and endurance. Regular exercise also will improve your overall health. It can help you control your weight, reduce stress, and improve your bone density. Over the next year, patient will increase physical activity as tolerated with a goal of at least 150 minutes of moderate physical activity per week.  You can tell that you are  exercising at a moderate intensity if your heart starts beating faster and you start breathing faster but can still hold a conversation. Moderate-intensity exercise ideas include: Walking 1 mile (1.6 km) in about 15 minutes Biking Hiking Golfing Dancing Water aerobics Patient will verbalize understanding of everyday activities that increase physical activity by providing examples like the following: Yard work, such as: Sales promotion account executive Gardening Washing windows or floors Patient will be able to explain general safety guidelines for exercising:  Before you start a new exercise program, talk with your health care provider. Do not exercise so much that you hurt yourself, feel dizzy, or get very short of breath. Wear comfortable clothes and wear shoes with good support. Drink plenty of water while you exercise to prevent dehydration or heat stroke. Work out until your breathing and your heartbeat get faster.          This is a list of Health Maintenance Items that are overdue or due now: Influenza vaccine   Orders/Referrals Placed Today: No orders of the defined types were placed in this encounter.  (Contact our referral department at [phone] if you have not spoken with someone about your referral appointment within the next 5 days)    Follow-up Plan Follow-up with Emeterio Reeve, DO as planned Schedule your flu vaccine at your pharmacy or in-office. Medicare wellness visit in one year.  AVS printed and mailed to the patient.      Health Maintenance, Female Adopting a healthy lifestyle and getting preventive care are important in promoting health  and wellness. Ask your health care provider about: The right schedule for you to have regular tests and exams. Things you can do on your own to prevent diseases and keep yourself healthy. What should I know about diet, weight, and  exercise? Eat a healthy diet  Eat a diet that includes plenty of vegetables, fruits, low-fat dairy products, and lean protein. Do not eat a lot of foods that are high in solid fats, added sugars, or sodium. Maintain a healthy weight Body mass index (BMI) is used to identify weight problems. It estimates body fat based on height and weight. Your health care provider can help determine your BMI and help you achieve or maintain a healthy weight. Get regular exercise Get regular exercise. This is one of the most important things you can do for your health. Most adults should: Exercise for at least 150 minutes each week. The exercise should increase your heart rate and make you sweat (moderate-intensity exercise). Do strengthening exercises at least twice a week. This is in addition to the moderate-intensity exercise. Spend less time sitting. Even light physical activity can be beneficial. Watch cholesterol and blood lipids Have your blood tested for lipids and cholesterol at 75 years of age, then have this test every 5 years. Have your cholesterol levels checked more often if: Your lipid or cholesterol levels are high. You are older than 75 years of age. You are at high risk for heart disease. What should I know about cancer screening? Depending on your health history and family history, you may need to have cancer screening at various ages. This may include screening for: Breast cancer. Cervical cancer. Colorectal cancer. Skin cancer. Lung cancer. What should I know about heart disease, diabetes, and high blood pressure? Blood pressure and heart disease High blood pressure causes heart disease and increases the risk of stroke. This is more likely to develop in people who have high blood pressure readings, are of African descent, or are overweight. Have your blood pressure checked: Every 3-5 years if you are 68-58 years of age. Every year if you are 15 years old or older. Diabetes Have  regular diabetes screenings. This checks your fasting blood sugar level. Have the screening done: Once every three years after age 34 if you are at a normal weight and have a low risk for diabetes. More often and at a younger age if you are overweight or have a high risk for diabetes. What should I know about preventing infection? Hepatitis B If you have a higher risk for hepatitis B, you should be screened for this virus. Talk with your health care provider to find out if you are at risk for hepatitis B infection. Hepatitis C Testing is recommended for: Everyone born from [birth year] through 1965. Anyone with known risk factors for hepatitis C. Sexually transmitted infections (STIs) Get screened for STIs, including gonorrhea and chlamydia, if: You are sexually active and are younger than 75 years of age. You are older than 75 years of age and your health care provider tells you that you are at risk for this type of infection. Your sexual activity has changed since you were last screened, and you are at increased risk for chlamydia or gonorrhea. Ask your health care provider if you are at risk. Ask your health care provider about whether you are at high risk for HIV. Your health care provider may recommend a prescription medicine to help prevent HIV infection. If you choose to take medicine to prevent HIV, you  should first get tested for HIV. You should then be tested every 3 months for as long as you are taking the medicine. Pregnancy If you are about to stop having your period (premenopausal) and you may become pregnant, seek counseling before you get pregnant. Take 400 to 800 micrograms (mcg) of folic acid every day if you become pregnant. Ask for birth control (contraception) if you want to prevent pregnancy. Osteoporosis and menopause Osteoporosis is a disease in which the bones lose minerals and strength with aging. This can result in bone fractures. If you are 5 years old or older, or if you  are at risk for osteoporosis and fractures, ask your health care provider if you should: Be screened for bone loss. Take a calcium or vitamin D supplement to lower your risk of fractures. Be given hormone replacement therapy (HRT) to treat symptoms of menopause. Follow these instructions at home: Lifestyle Do not use any products that contain nicotine or tobacco, such as cigarettes, e-cigarettes, and chewing tobacco. If you need help quitting, ask your health care provider. Do not use street drugs. Do not share needles. Ask your health care provider for help if you need support or information about quitting drugs. Alcohol use Do not drink alcohol if: Your health care provider tells you not to drink. You are pregnant, may be pregnant, or are planning to become pregnant. If you drink alcohol: Limit how much you use to 0-1 drink a day. Limit intake if you are breastfeeding. Be aware of how much alcohol is in your drink. In the [place] one drink equals one 12 oz bottle of beer (355 mL), one 5 oz glass of wine (148 mL), or one 1 oz glass of hard liquor (44 mL). General instructions Schedule regular health, dental, and eye exams. Stay current with your vaccines. Tell your health care provider if: You often feel depressed. You have ever been abused or do not feel safe at home. Summary Adopting a healthy lifestyle and getting preventive care are important in promoting health and wellness. Follow your health care provider's instructions about healthy diet, exercising, and getting tested or screened for diseases. Follow your health care provider's instructions on monitoring your cholesterol and blood pressure. This information is not intended to replace advice given to you by your health care provider. Make sure you discuss any questions you have with your health care provider. Document Revised: 07/04/2020 Document Reviewed: 04/19/2018 Elsevier Patient Education  2022 [employer].

## 2021-02-18 ENCOUNTER — Ambulatory Visit (INDEPENDENT_AMBULATORY_CARE_PROVIDER_SITE_OTHER): Payer: Medicare Other | Admitting: Family Medicine

## 2021-02-18 VITALS — BP 137/43 | HR 51

## 2021-02-18 DIAGNOSIS — Z23 Encounter for immunization: Secondary | ICD-10-CM | POA: Diagnosis not present

## 2021-02-18 NOTE — Progress Notes (Signed)
Patient is here for a flu vaccine. Verified no previous allergy to flu vaccine, eggs, or latex. Flu injection to left deltoid with no apparent complications. Patient advised to call with any problems.   Blood pressure is on the low end today 137/43. She is not feeling light headed or dizzy.

## 2021-02-24 ENCOUNTER — Ambulatory Visit (INDEPENDENT_AMBULATORY_CARE_PROVIDER_SITE_OTHER): Payer: Medicare Other | Admitting: Physician Assistant

## 2021-02-24 ENCOUNTER — Other Ambulatory Visit: Payer: Self-pay

## 2021-02-24 VITALS — BP 167/61 | HR 53

## 2021-02-24 DIAGNOSIS — R300 Dysuria: Secondary | ICD-10-CM | POA: Diagnosis not present

## 2021-02-24 DIAGNOSIS — N390 Urinary tract infection, site not specified: Secondary | ICD-10-CM | POA: Insufficient documentation

## 2021-02-24 DIAGNOSIS — N3001 Acute cystitis with hematuria: Secondary | ICD-10-CM

## 2021-02-24 DIAGNOSIS — R350 Frequency of micturition: Secondary | ICD-10-CM | POA: Diagnosis not present

## 2021-02-24 LAB — POCT URINALYSIS DIP (CLINITEK)
Bilirubin, UA: NEGATIVE
Glucose, UA: NEGATIVE mg/dL
Ketones, POC UA: NEGATIVE mg/dL
Nitrite, UA: POSITIVE — AB
POC PROTEIN,UA: 100 — AB
Spec Grav, UA: 1.02 (ref 1.010–1.025)
Urobilinogen, UA: 0.2 E.U./dL
pH, UA: 6 (ref 5.0–8.0)

## 2021-02-24 MED ORDER — NITROFURANTOIN MONOHYD MACRO 100 MG PO CAPS: 100.0000 mg | ORAL_CAPSULE | Freq: Two times a day (BID) | ORAL | 0 refills | Status: DC

## 2021-02-24 NOTE — Progress Notes (Signed)
Established Patient Office Visit  Subjective:  Patient ID: Cynthia Adams, female    DOB: [birth year]  Age: 75 y.o. MRN: [id number]  CC:  Chief Complaint  Patient presents with   Dysuria    HPI Cynthia Adams complains of frequent urination and painful urination for 4 days. Patient reports no recent antibiotic use or no recent catheterization. She did have catheterization about a year ago and since then she has had 3 UTI. Patient not taken Azo. Denies flank pain, pelvic pain, fever, chills or sweats.     Past Medical History:  Diagnosis Date   Cataract 2017   Cataract surgery in 01-2016 and 02-2016   GERD (gastroesophageal reflux disease)    History of iron deficiency 06/13/2014   Hyperlipidemia    Hypertension    Nerve pain    Seasonal allergies     Past Surgical History:  Procedure Laterality Date   ABDOMINAL HYSTERECTOMY     BREAST BIOPSY     BREAST CYST EXCISION Right    CATARACT EXTRACTION W/ INTRAOCULAR LENS  IMPLANT, BILATERAL Bilateral 01-2016, 02-2016   CATARACT EXTRACTION W/ INTRAOCULAR LENS & ANTERIOR VITRECTOMY Bilateral 01-29-2016, 02-19-2016   Right eye in 01-2016 and Left eye 02-2016   CERVICAL FUSION  02/11/2020   CESAREAN SECTION     CESAREAN SECTION     CHOLECYSTECTOMY     LUMBAR [place] SURGERY     TONSILLECTOMY      Family History  Problem Relation Age of Onset   Cancer Mother        breast   Dementia Mother    Hypertension Mother    Hyperlipidemia Mother    Breast cancer Mother    Dementia Sister    Hypertension Sister    Hyperlipidemia Sister    Hyperlipidemia Brother    Hypertension Brother    Hypertension Son    Dementia Maternal Aunt    Breast cancer Maternal Aunt    Cancer Paternal Uncle    Alcohol abuse Paternal Uncle    Stroke Maternal Grandmother    Heart disease Paternal Grandmother    Hyperlipidemia Sister    Hypertension Sister    Hyperlipidemia Sister    Hypertension Sister    Dementia Maternal Aunt    Breast cancer  Maternal Aunt    Dementia Maternal Aunt    Breast cancer Maternal Aunt    Alcohol abuse Paternal Uncle     Social History   Socioeconomic History   Marital status: Married    Spouse name: John   Number of children: 2   Years of education: 12   Highest education level: 12th grade  Occupational History   Occupation: Glass blower/designer    Comment: retired  Tobacco Use   Smoking status: Never   Smokeless tobacco: Never  Vaping Use   Vaping Use: Never used  Substance and Sexual Activity   Alcohol use: Not Currently    Comment: .   Drug use: Never   Sexual activity: Not Currently  Other Topics Concern   Not on file  Social History Narrative   Lives with her husband. She has two children. She enjoys reading.   Social Determinants of Health   Financial Resource Strain: Low Risk    Difficulty of Paying Living Expenses: Not hard at all  Food Insecurity: No Food Insecurity   Worried About Charity fundraiser in the Last Year: Never true   Ran Out of Food in the Last Year: Never true  Transportation  Needs: No Transportation Needs   Lack of Transportation (Medical): No   Lack of Transportation (Non-Medical): No  Physical Activity: Inactive   Days of Exercise per Week: 0 days   Minutes of Exercise per Session: 0 min  Stress: No Stress Concern Present   Feeling of Stress : Not at all  Social Connections: Socially Integrated   Frequency of Communication with Friends and Family: Three times a week   Frequency of Social Gatherings with Friends and Family: Once a week   Attends Religious Services: More than 4 times per year   Active Member of [employer] or Organizations: Yes   Attends Music therapist: More than 4 times per year   Marital Status: Married  Human resources officer Violence: Not At Risk   Fear of Current or Ex-Partner: No   Emotionally Abused: No   Physically Abused: No   Sexually Abused: No    Outpatient Medications Prior to Visit  Medication Sig Dispense Refill    aspirin 81 MG tablet Take 81 mg by mouth daily.     Cyanocobalamin (B-12 PO) Take 1 mL by mouth daily.      estradiol (ESTRACE) 0.5 MG tablet TAKE ONE-HALF TABLET BY  MOUTH DAILY 45 tablet 3   fluticasone (FLONASE) 50 MCG/ACT nasal spray USE 2 SPRAYS IN EACH  NOSTRIL DAILY (Patient taking differently: As needed) 48 g 3   gabapentin (NEURONTIN) 600 MG tablet TAKE 1 TABLET BY MOUTH IN  THE MORNING, 1 TABLET AT  LUNCH, AND 2 TABLETS EVERY  NIGHT AT BEDTIME 360 tablet 3   gemfibrozil (LOPID) 600 MG tablet TAKE 1 TABLET BY MOUTH  TWICE DAILY BEFORE MEALS 180 tablet 3   HYDROcodone-acetaminophen (NORCO) 10-325 MG tablet Take by mouth.     metoprolol tartrate (LOPRESSOR) 50 MG tablet Take 1 tablet (50 mg total) by mouth 2 (two) times daily. 180 tablet 3   metroNIDAZOLE (METROCREAM) 0.75 % cream Apply topically.     Multiple Vitamins-Minerals (MULTIVITAMIN WITH MINERALS) tablet Take 1 tablet by mouth daily.     omeprazole (PRILOSEC) 40 MG capsule Take 40 mg by mouth daily.     Wheat Dextrin (BENEFIBER DRINK MIX PO) Take by mouth daily. Pt takes 2 teaspoons in the morning.     nitrofurantoin, macrocrystal-monohydrate, (MACROBID) 100 MG capsule Take 1 capsule (100 mg total) by mouth 2 (two) times daily. (Patient not taking: Reported on 02/13/2021) 10 capsule 0   No facility-administered medications prior to visit.    Allergies  Allergen Reactions   Lovaza [Omega-3-Acid Ethyl Esters] Diarrhea   Minocycline Diarrhea, Nausea And Vomiting and Other (See Comments)    Stomach pain     Pantoprazole Swelling   Sulfa Antibiotics Swelling   Amoxicillin    Other    Rosuvastatin Swelling   Amoxicillin-Pot Clavulanate Diarrhea   Lipitor [Atorvastatin] Other (See Comments)    Diarrhea and insomnia   Pravastatin Diarrhea    ROS Review of Systems    Objective:    Physical Exam  BP (!) 167/61   Pulse (!) 53   SpO2 100%  Wt Readings from Last 3 Encounters:  10/15/20 126 lb 1.9 oz (57.2 kg)   09/03/20 125 lb 1.9 oz (56.8 kg)  08/05/20 130 lb (59 kg)     There are no preventive care reminders to display for this patient.  There are no preventive care reminders to display for this patient.  Lab Results  Component Value Date   TSH 4.08 07/31/2019   Lab  Results  Component Value Date   WBC 11.8 (H) 03/05/2020   HGB 10.6 (L) 03/05/2020   HCT 32.2 (L) 03/05/2020   MCV 93.1 03/05/2020   PLT 407 (H) 03/05/2020   Lab Results  Component Value Date   NA 138 03/05/2020   K 4.5 03/05/2020   CO2 26 03/05/2020   GLUCOSE 92 03/05/2020   BUN 17 03/05/2020   CREATININE 1.04 (H) 03/05/2020   BILITOT 0.3 03/05/2020   ALKPHOS 90 02/15/2020   AST 13 03/05/2020   ALT 8 03/05/2020   PROT 7.4 03/05/2020   ALBUMIN 4.3 02/15/2020   CALCIUM 10.1 03/05/2020   ANIONGAP 15 02/15/2020   Lab Results  Component Value Date   CHOL 185 07/31/2020   Lab Results  Component Value Date   HDL 60 07/31/2020   Lab Results  Component Value Date   LDLCALC 111 (H) 07/31/2020   Lab Results  Component Value Date   TRIG 75 07/31/2020   Lab Results  Component Value Date   CHOLHDL 3.1 07/31/2020   Lab Results  Component Value Date   HGBA1C 5.8 03/01/2020      Assessment & Plan:  Dysuria - Urinalysis positive for nit, blood and leu. Macrobid bid for 5 days sent per [facility]. Culture sent. Patient advised to schedule an establish care appointment with Dr Zigmund Daniel.    Problem List Items Addressed This Visit   None Visit Diagnoses     Dysuria    -  Primary   Relevant Orders   POCT URINALYSIS DIP (CLINITEK) (Completed)   Urine Culture   Frequent urination       Relevant Orders   POCT URINALYSIS DIP (CLINITEK) (Completed)   Urine Culture       Meds ordered this encounter  Medications   nitrofurantoin, macrocrystal-monohydrate, (MACROBID) 100 MG capsule    Sig: Take 1 capsule (100 mg total) by mouth 2 (two) times daily.    Dispense:  10 capsule    Refill:  0    Follow-up:  Return if symptoms worsen or fail to improve.    Lavell Luster, [place]

## 2021-02-24 NOTE — Progress Notes (Signed)
Patient ID: Cynthia Adams, female   DOB: [birth year], 75 y.o.   MRN: [id number] .Marland Kitchen Results for orders placed or performed in visit on 02/24/21  POCT URINALYSIS DIP (CLINITEK)  Result Value Ref Range   Color, UA yellow yellow   Clarity, UA cloudy (A) clear   Glucose, UA negative negative mg/dL   Bilirubin, UA negative negative   Ketones, POC UA negative negative mg/dL   Spec Grav, UA 1.020 1.010 - 1.025   Blood, UA moderate (A) negative   pH, UA 6.0 5.0 - 8.0   POC PROTEIN,UA =100 (A) negative, trace   Urobilinogen, UA 0.2 0.2 or 1.0 E.U./dL   Nitrite, UA Positive (A) Negative   Leukocytes, UA Large (3+) (A) Negative   UA positive for blood/leuks/nitrites/protein.  Uncomplicated and treated with macrobid for 5 days.  Urine culture sent off.  Last UTI April (4 in one year timeframe) Consider referral to urology.

## 2021-02-25 ENCOUNTER — Other Ambulatory Visit: Payer: Self-pay | Admitting: Physician Assistant

## 2021-02-25 ENCOUNTER — Encounter: Payer: Self-pay | Admitting: Physician Assistant

## 2021-02-25 MED ORDER — CIPROFLOXACIN HCL 500 MG PO TABS: 500.0000 mg | ORAL_TABLET | Freq: Two times a day (BID) | ORAL | 0 refills | Status: AC

## 2021-02-26 ENCOUNTER — Other Ambulatory Visit: Payer: Self-pay | Admitting: Physician Assistant

## 2021-02-26 LAB — URINE CULTURE
MICRO NUMBER:: 12520510
SPECIMEN QUALITY:: ADEQUATE

## 2021-02-26 MED ORDER — CEFIXIME 400 MG PO CAPS: 400.0000 mg | ORAL_CAPSULE | Freq: Every day | ORAL | 0 refills | Status: DC

## 2021-02-26 NOTE — Progress Notes (Signed)
Culture showed e.coli that cipro is not going to fully treat. Stop cipro and start suprax. It was sent to the pharmacy.

## 2021-03-26 DIAGNOSIS — G603 Idiopathic progressive neuropathy: Secondary | ICD-10-CM | POA: Diagnosis not present

## 2021-03-26 DIAGNOSIS — M542 Cervicalgia: Secondary | ICD-10-CM | POA: Diagnosis not present

## 2021-03-26 DIAGNOSIS — R202 Paresthesia of skin: Secondary | ICD-10-CM | POA: Diagnosis not present

## 2021-03-26 DIAGNOSIS — G2581 Restless legs syndrome: Secondary | ICD-10-CM | POA: Diagnosis not present

## 2021-03-26 DIAGNOSIS — Z760 Encounter for issue of repeat prescription: Secondary | ICD-10-CM | POA: Diagnosis not present

## 2021-04-07 ENCOUNTER — Ambulatory Visit (INDEPENDENT_AMBULATORY_CARE_PROVIDER_SITE_OTHER): Payer: Medicare Other | Admitting: Family Medicine

## 2021-04-07 ENCOUNTER — Other Ambulatory Visit: Payer: Self-pay

## 2021-04-07 ENCOUNTER — Encounter: Payer: Self-pay | Admitting: Family Medicine

## 2021-04-07 VITALS — BP 154/51 | HR 63 | Temp 98.4°F

## 2021-04-07 DIAGNOSIS — R300 Dysuria: Secondary | ICD-10-CM | POA: Diagnosis not present

## 2021-04-07 LAB — POCT URINALYSIS DIP (CLINITEK)
Bilirubin, UA: NEGATIVE
Glucose, UA: NEGATIVE mg/dL
Ketones, POC UA: NEGATIVE mg/dL
Nitrite, UA: POSITIVE — AB
POC PROTEIN,UA: NEGATIVE
Spec Grav, UA: 1.01 (ref 1.010–1.025)
Urobilinogen, UA: 0.2 E.U./dL
pH, UA: 6 (ref 5.0–8.0)

## 2021-04-07 MED ORDER — CEPHALEXIN 500 MG PO CAPS: 500.0000 mg | ORAL_CAPSULE | Freq: Two times a day (BID) | ORAL | 0 refills | Status: AC

## 2021-04-07 NOTE — Progress Notes (Signed)
Established Patient Office Visit  Subjective:  Patient ID: Cynthia Adams, female    DOB: [birth year]  Age: 75 y.o. MRN: [id number]  CC:  Chief Complaint  Patient presents with   Dysuria    HPI MAKELLE MARRONE complains of painful urination for 4 days. Patient reports no recent antibiotic use or no recent catheterization. Patient not taken Azo. Denies flank pain, pelvic pain, fever, chills or sweats.    Past Medical History:  Diagnosis Date   Cataract 2017   Cataract surgery in 01-2016 and 02-2016   GERD (gastroesophageal reflux disease)    History of iron deficiency 06/13/2014   Hyperlipidemia    Hypertension    Nerve pain    Seasonal allergies     Past Surgical History:  Procedure Laterality Date   ABDOMINAL HYSTERECTOMY     BREAST BIOPSY     BREAST CYST EXCISION Right    CATARACT EXTRACTION W/ INTRAOCULAR LENS  IMPLANT, BILATERAL Bilateral 01-2016, 02-2016   CATARACT EXTRACTION W/ INTRAOCULAR LENS & ANTERIOR VITRECTOMY Bilateral 01-29-2016, 02-19-2016   Right eye in 01-2016 and Left eye 02-2016   CERVICAL FUSION  02/11/2020   CESAREAN SECTION     CESAREAN SECTION     CHOLECYSTECTOMY     LUMBAR [place] SURGERY     TONSILLECTOMY      Family History  Problem Relation Age of Onset   Cancer Mother        breast   Dementia Mother    Hypertension Mother    Hyperlipidemia Mother    Breast cancer Mother    Dementia Sister    Hypertension Sister    Hyperlipidemia Sister    Hyperlipidemia Brother    Hypertension Brother    Hypertension Son    Dementia Maternal Aunt    Breast cancer Maternal Aunt    Cancer Paternal Uncle    Alcohol abuse Paternal Uncle    Stroke Maternal Grandmother    Heart disease Paternal Grandmother    Hyperlipidemia Sister    Hypertension Sister    Hyperlipidemia Sister    Hypertension Sister    Dementia Maternal Aunt    Breast cancer Maternal Aunt    Dementia Maternal Aunt    Breast cancer Maternal Aunt    Alcohol abuse Paternal Uncle      Social History   Socioeconomic History   Marital status: Married    Spouse name: John   Number of children: 2   Years of education: 12   Highest education level: 12th grade  Occupational History   Occupation: Glass blower/designer    Comment: retired  Tobacco Use   Smoking status: Never   Smokeless tobacco: Never  Vaping Use   Vaping Use: Never used  Substance and Sexual Activity   Alcohol use: Not Currently    Comment: .   Drug use: Never   Sexual activity: Not Currently  Other Topics Concern   Not on file  Social History Narrative   Lives with her husband. She has two children. She enjoys reading.   Social Determinants of Health   Financial Resource Strain: Low Risk    Difficulty of Paying Living Expenses: Not hard at all  Food Insecurity: No Food Insecurity   Worried About Charity fundraiser in the Last Year: Never true   [place] in the Last Year: Never true  Transportation Needs: No Transportation Needs   Lack of Transportation (Medical): No   Lack of Transportation (Non-Medical): No  Physical  Activity: Inactive   Days of Exercise per Week: 0 days   Minutes of Exercise per Session: 0 min  Stress: No Stress Concern Present   Feeling of Stress : Not at all  Social Connections: Socially Integrated   Frequency of Communication with Friends and Family: Three times a week   Frequency of Social Gatherings with Friends and Family: Once a week   Attends Religious Services: More than 4 times per year   Active Member of [employer] or Organizations: Yes   Attends Music therapist: More than 4 times per year   Marital Status: Married  Human resources officer Violence: Not At Risk   Fear of Current or Ex-Partner: No   Emotionally Abused: No   Physically Abused: No   Sexually Abused: No    Outpatient Medications Prior to Visit  Medication Sig Dispense Refill   aspirin 81 MG tablet Take 81 mg by mouth daily.     Cyanocobalamin (B-12 PO) Take 1 mL by mouth daily.       estradiol (ESTRACE) 0.5 MG tablet TAKE ONE-HALF TABLET BY  MOUTH DAILY 45 tablet 3   fluticasone (FLONASE) 50 MCG/ACT nasal spray USE 2 SPRAYS IN EACH  NOSTRIL DAILY (Patient taking differently: As needed) 48 g 3   gabapentin (NEURONTIN) 600 MG tablet TAKE 1 TABLET BY MOUTH IN  THE MORNING, 1 TABLET AT  LUNCH, AND 2 TABLETS EVERY  NIGHT AT BEDTIME 360 tablet 3   gemfibrozil (LOPID) 600 MG tablet TAKE 1 TABLET BY MOUTH  TWICE DAILY BEFORE MEALS 180 tablet 3   HYDROcodone-acetaminophen (NORCO) 10-325 MG tablet Take by mouth.     metoprolol tartrate (LOPRESSOR) 50 MG tablet Take 1 tablet (50 mg total) by mouth 2 (two) times daily. 180 tablet 3   metroNIDAZOLE (METROCREAM) 0.75 % cream Apply topically.     Multiple Vitamins-Minerals (MULTIVITAMIN WITH MINERALS) tablet Take 1 tablet by mouth daily.     omeprazole (PRILOSEC) 40 MG capsule Take 40 mg by mouth daily.     Wheat Dextrin (BENEFIBER DRINK MIX PO) Take by mouth daily. Pt takes 2 teaspoons in the morning.     cefixime (SUPRAX) 400 MG CAPS capsule Take 1 capsule (400 mg total) by mouth daily. 7 capsule 0   nitrofurantoin, macrocrystal-monohydrate, (MACROBID) 100 MG capsule Take 1 capsule (100 mg total) by mouth 2 (two) times daily. 10 capsule 0   No facility-administered medications prior to visit.    Allergies  Allergen Reactions   Amoxicillin    Lovaza [Omega-3-Acid Ethyl Esters] Diarrhea   Macrobid [Nitrofurantoin]    Minocycline Diarrhea, Nausea And Vomiting and Other (See Comments)    Stomach pain     Pantoprazole Swelling   Sulfa Antibiotics Swelling   Amoxicillin-Pot Clavulanate Diarrhea   Lipitor [Atorvastatin] Other (See Comments)    Diarrhea and insomnia   Pravastatin Diarrhea   Rosuvastatin Swelling    ROS Review of Systems    Objective:    Physical Exam  BP (!) 154/51   Pulse 63   Temp 98.4 F (36.9 C) (Oral)   SpO2 97%  Wt Readings from Last 3 Encounters:  10/15/20 126 lb 1.9 oz (57.2 kg)   09/03/20 125 lb 1.9 oz (56.8 kg)  08/05/20 130 lb (59 kg)     Health Maintenance Due  Topic Date Due   COVID-19 Vaccine (5 - Booster for Pfizer series) 02/17/2021    There are no preventive care reminders to display for this patient.  Lab Results  Component Value Date   TSH 4.08 07/31/2019   Lab Results  Component Value Date   WBC 11.8 (H) 03/05/2020   HGB 10.6 (L) 03/05/2020   HCT 32.2 (L) 03/05/2020   MCV 93.1 03/05/2020   PLT 407 (H) 03/05/2020   Lab Results  Component Value Date   NA 138 03/05/2020   K 4.5 03/05/2020   CO2 26 03/05/2020   GLUCOSE 92 03/05/2020   BUN 17 03/05/2020   CREATININE 1.04 (H) 03/05/2020   BILITOT 0.3 03/05/2020   ALKPHOS 90 02/15/2020   AST 13 03/05/2020   ALT 8 03/05/2020   PROT 7.4 03/05/2020   ALBUMIN 4.3 02/15/2020   CALCIUM 10.1 03/05/2020   ANIONGAP 15 02/15/2020   Lab Results  Component Value Date   CHOL 185 07/31/2020   Lab Results  Component Value Date   HDL 60 07/31/2020   Lab Results  Component Value Date   LDLCALC 111 (H) 07/31/2020   Lab Results  Component Value Date   TRIG 75 07/31/2020   Lab Results  Component Value Date   CHOLHDL 3.1 07/31/2020   Lab Results  Component Value Date   HGBA1C 5.8 03/01/2020      Assessment & Plan:  Dysuria - Per Dr Zigmund Daniel, send for a culture. Patient was advised to start cephalexin. She was also advised to follow up with Dr Zigmund Daniel in the near future.    Problem List Items Addressed This Visit   None Visit Diagnoses     Dysuria    -  Primary   Relevant Orders   POCT URINALYSIS DIP (CLINITEK)   Urine Culture       Meds ordered this encounter  Medications   cephALEXin (KEFLEX) 500 MG capsule    Sig: Take 1 capsule (500 mg total) by mouth 2 (two) times daily for 7 days.    Dispense:  14 capsule    Refill:  0    Follow-up: Return if symptoms worsen or fail to improve.    Lavell Luster, [place]

## 2021-04-07 NOTE — Progress Notes (Signed)
Medical screening examination/treatment was performed by qualified clinical staff member and as supervising physician I was immediately available for consultation/collaboration. I have reviewed documentation and agree with assessment and plan.  Her symptoms and urinalysis are consistent with urinary tract infection.  Starting course of cephalexin.  She will follow-up with me in a couple of weeks.  Luetta Nutting, DO

## 2021-04-10 LAB — URINE CULTURE
MICRO NUMBER:: 12692303
SPECIMEN QUALITY:: ADEQUATE

## 2021-04-13 MED ORDER — NITROFURANTOIN MONOHYD MACRO 100 MG PO CAPS: 100.0000 mg | ORAL_CAPSULE | Freq: Two times a day (BID) | ORAL | 0 refills | Status: AC

## 2021-04-13 NOTE — Addendum Note (Signed)
Addended by: Perlie Mayo on: 04/13/2021 12:17 PM   Modules accepted: Orders

## 2021-04-13 NOTE — Progress Notes (Signed)
Updated rx sent

## 2021-04-15 DIAGNOSIS — Z789 Other specified health status: Secondary | ICD-10-CM

## 2021-04-15 NOTE — Progress Notes (Addendum)
[facility])                                            [place] Team                                        Statin Quality Measure Assessment    04/15/2021  Cynthia Adams 21-May-1945 [id number]  Per review of chart and payor information, this patient has been flagged for non-adherence to the following CMS Quality Measure:   []  Statin Use in Persons with Diabetes  [x]  Statin Use in Persons with Cardiovascular Disease  The ASCVD Risk score (Arnett DK, et al., 2019) failed to calculate for the following reasons:   The patient has a prior MI or stroke diagnosis  Prior h/o of NSTEMI. Patient does not have a statin on file d/t intolerances (diarrhea, insomnia, swelling).She failed rosuvastatin, pravastatin, and atorvastatin. Gemfibrozil. on file. Recent LDL 111. Next appointment w/Dr. Zigmund Daniel  is on 04/16/21. If deemed therapeutically appropriate, please consider provider discussion regarding statin use (alternative dosing/statin).    Please consider ONE of the following recommendations:   Initiate high intensity statin Atorvastatin 40mg  once daily, #90, 3 refills   Rosuvastatin 20mg  once daily, #90, 3 refills    Initiate moderate intensity          statin with reduced frequency if prior          statin intolerance 1x weekly, #13, 3 refills   2x weekly, #26, 3 refills   3x weekly, #39, 3 refills    Code for past statin intolerance or other exclusions (required annually)  Drug Induced Myopathy G72.0   Myositis, unspecified M60.9   Rhabdomyolysis M62.82   Prediabetes R73.03   Adverse effect of antihyperlipidemic and antiarteriosclerotic drugs, initial encounter K91.7H1T    Thank you for your time,  Cynthia Adams, [place] Cell: [phone]

## 2021-04-16 ENCOUNTER — Ambulatory Visit (INDEPENDENT_AMBULATORY_CARE_PROVIDER_SITE_OTHER): Payer: Medicare Other | Admitting: Family Medicine

## 2021-04-16 ENCOUNTER — Ambulatory Visit (INDEPENDENT_AMBULATORY_CARE_PROVIDER_SITE_OTHER): Payer: Medicare Other

## 2021-04-16 ENCOUNTER — Encounter: Payer: Self-pay | Admitting: Family Medicine

## 2021-04-16 ENCOUNTER — Other Ambulatory Visit: Payer: Self-pay

## 2021-04-16 ENCOUNTER — Other Ambulatory Visit: Payer: Self-pay | Admitting: Family Medicine

## 2021-04-16 ENCOUNTER — Encounter: Payer: Medicare Other | Admitting: Osteopathic Medicine

## 2021-04-16 VITALS — BP 141/58 | HR 53 | Temp 98.0°F | Ht 60.0 in | Wt 125.0 lb

## 2021-04-16 DIAGNOSIS — R7303 Prediabetes: Secondary | ICD-10-CM | POA: Diagnosis not present

## 2021-04-16 DIAGNOSIS — Z1231 Encounter for screening mammogram for malignant neoplasm of breast: Secondary | ICD-10-CM

## 2021-04-16 DIAGNOSIS — M5412 Radiculopathy, cervical region: Secondary | ICD-10-CM

## 2021-04-16 DIAGNOSIS — I10 Essential (primary) hypertension: Secondary | ICD-10-CM | POA: Diagnosis not present

## 2021-04-16 DIAGNOSIS — E782 Mixed hyperlipidemia: Secondary | ICD-10-CM | POA: Diagnosis not present

## 2021-04-16 DIAGNOSIS — Z0000 Encounter for general adult medical examination without abnormal findings: Secondary | ICD-10-CM

## 2021-04-16 NOTE — Assessment & Plan Note (Signed)
X-ray of cervical spine ordered.

## 2021-04-16 NOTE — Assessment & Plan Note (Signed)
Well adult Orders Placed This Encounter  Procedures  . DG Cervical Spine Complete    Standing Status:   Future    Number of Occurrences:   1    Standing Expiration Date:   04/16/2022    Order Specific Question:   Reason for Exam (SYMPTOM  OR DIAGNOSIS REQUIRED)    Answer:   L cervical radiculitis    Order Specific Question:   Preferred imaging location?    Answer:   [facility]  . COMPLETE METABOLIC PANEL WITH GFR  . CBC with Differential  . Lipid Panel w/reflex Direct LDL  . HgB A1c  Immunizations: Up-to-date Screenings: Per lab orders Anticipatory guidance/risk factor reduction: Recommendations per AVS

## 2021-04-16 NOTE — Progress Notes (Signed)
Cynthia Adams - 75 y.o. female MRN [id number]  Date of birth: 09-Jan-1946  Subjective Chief Complaint  Patient presents with   Annual Exam    HPI Cynthia Adams is a 75 year old female here today for annual exam.  She reports that she is doing well at this time.  Recently had urinary tract infection which is now resolved.  She does report having some pain in her left neck which radiates into her left shoulder.  She denies weakness.  She has had previous cervical fusion.    She does walk some for exercise.  She feels like her diet is pretty healthy.  She has a non-smoker.  She denies alcohol use.'  She is up-to-date on vaccines, DEXA breast cancer and colon cancer screening.  Review of Systems  Constitutional:  Negative for chills, fever, malaise/fatigue and weight loss.  HENT:  Negative for congestion, ear pain and sore throat.   Eyes:  Negative for blurred vision, double vision and pain.  Respiratory:  Negative for cough and shortness of breath.   Cardiovascular:  Negative for chest pain and palpitations.  Gastrointestinal:  Negative for abdominal pain, blood in stool, constipation, heartburn and nausea.  Genitourinary:  Negative for dysuria and urgency.  Musculoskeletal:  Negative for joint pain and myalgias.  Neurological:  Negative for dizziness and headaches.  Endo/Heme/Allergies:  Does not bruise/bleed easily.  Psychiatric/Behavioral:  Negative for depression. The patient is not nervous/anxious and does not have insomnia.    Allergies  Allergen Reactions   Amoxicillin    Lovaza [Omega-3-Acid Ethyl Esters] Diarrhea   Macrobid [Nitrofurantoin]    Minocycline Diarrhea, Nausea And Vomiting and Other (See Comments)    Stomach pain     Pantoprazole Swelling   Sulfa Antibiotics Swelling   Amoxicillin-Pot Clavulanate Diarrhea   Lipitor [Atorvastatin] Other (See Comments)    Diarrhea and insomnia   Pravastatin Diarrhea   Rosuvastatin Swelling    Past Medical History:  Diagnosis  Date   Cataract 2017   Cataract surgery in 01-2016 and 02-2016   GERD (gastroesophageal reflux disease)    History of iron deficiency 06/13/2014   Hyperlipidemia    Hypertension    Nerve pain    Seasonal allergies     Past Surgical History:  Procedure Laterality Date   ABDOMINAL HYSTERECTOMY     BREAST BIOPSY     BREAST CYST EXCISION Right    CATARACT EXTRACTION W/ INTRAOCULAR LENS  IMPLANT, BILATERAL Bilateral 01-2016, 02-2016   CATARACT EXTRACTION W/ INTRAOCULAR LENS & ANTERIOR VITRECTOMY Bilateral 01-29-2016, 02-19-2016   Right eye in 01-2016 and Left eye 02-2016   CERVICAL FUSION  02/11/2020   CESAREAN SECTION     CESAREAN SECTION     CHOLECYSTECTOMY     LUMBAR [place] SURGERY     TONSILLECTOMY      Social History   Socioeconomic History   Marital status: Married    Spouse name: Cynthia Adams   Number of children: 2   Years of education: 12   Highest education level: 12th grade  Occupational History   Occupation: Glass blower/designer    Comment: retired  Tobacco Use   Smoking status: Never   Smokeless tobacco: Never  Vaping Use   Vaping Use: Never used  Substance and Sexual Activity   Alcohol use: Not Currently    Comment: .   Drug use: Never   Sexual activity: Not Currently  Other Topics Concern   Not on file  Social History Narrative   Lives with her husband.  She has two children. She enjoys reading.   Social Determinants of Health   Financial Resource Strain: Low Risk    Difficulty of Paying Living Expenses: Not hard at all  Food Insecurity: No Food Insecurity   Worried About Charity fundraiser in the Last Year: Never true   [place] in the Last Year: Never true  Transportation Needs: No Transportation Needs   Lack of Transportation (Medical): No   Lack of Transportation (Non-Medical): No  Physical Activity: Inactive   Days of Exercise per Week: 0 days   Minutes of Exercise per Session: 0 min  Stress: No Stress Concern Present   Feeling of Stress : Not at  all  Social Connections: Socially Integrated   Frequency of Communication with Friends and Family: Three times a week   Frequency of Social Gatherings with Friends and Family: Once a week   Attends Religious Services: More than 4 times per year   Active Member of [employer] or Organizations: Yes   Attends Music therapist: More than 4 times per year   Marital Status: Married    Family History  Problem Relation Age of Onset   Cancer Mother        breast   Dementia Mother    Hypertension Mother    Hyperlipidemia Mother    Breast cancer Mother    Dementia Sister    Hypertension Sister    Hyperlipidemia Sister    Hyperlipidemia Brother    Hypertension Brother    Hypertension Son    Dementia Maternal Aunt    Breast cancer Maternal Aunt    Cancer Paternal Uncle    Alcohol abuse Paternal Uncle    Stroke Maternal Grandmother    Heart disease Paternal Grandmother    Hyperlipidemia Sister    Hypertension Sister    Hyperlipidemia Sister    Hypertension Sister    Dementia Maternal Aunt    Breast cancer Maternal Aunt    Dementia Maternal Aunt    Breast cancer Maternal Aunt    Alcohol abuse Paternal Uncle     Health Maintenance  Topic Date Due   COVID-19 Vaccine (5 - Booster for Pfizer series) 02/17/2021   TETANUS/TDAP  08/15/2024   COLONOSCOPY (Pts 45-25yrs Insurance coverage will need to be confirmed)  10/02/2024   Pneumonia Vaccine 35+ Years old  Completed   INFLUENZA VACCINE  Completed   DEXA SCAN  Completed   Hepatitis C Screening  Completed   Zoster Vaccines- Shingrix  Completed   HPV VACCINES  Aged Out     ----------------------------------------------------------------------------------------------------------------------------------------------------------------------------------------------------------------- Physical Exam BP (!) 141/58 (BP Location: Left Arm, Patient Position: Sitting, Cuff Size: Normal)   Pulse (!) 53   Temp 98 F (36.7 C)   Ht 5'  (1.524 m)   Wt 125 lb (56.7 kg)   SpO2 100%   BMI 24.41 kg/m   Physical Exam Constitutional:      General: She is not in acute distress. HENT:     Head: Normocephalic and atraumatic.     Right Ear: Tympanic membrane and ear canal normal.     Left Ear: Tympanic membrane and ear canal normal.     Nose: Nose normal.  Eyes:     General: No scleral icterus.    Conjunctiva/sclera: Conjunctivae normal.  Neck:     Thyroid: No thyromegaly.  Cardiovascular:     Rate and Rhythm: Normal rate and regular rhythm.     Heart sounds: Normal heart sounds.  Pulmonary:  Effort: Pulmonary effort is normal.     Breath sounds: Normal breath sounds.  Abdominal:     General: Bowel sounds are normal. There is no distension.     Palpations: Abdomen is soft.     Tenderness: There is no abdominal tenderness. There is no guarding.  Musculoskeletal:        General: Normal range of motion.     Cervical back: Normal range of motion and neck supple.  Lymphadenopathy:     Cervical: No cervical adenopathy.  Skin:    General: Skin is warm and dry.     Findings: No rash.  Neurological:     General: No focal deficit present.     Mental Status: She is alert and oriented to person, place, and time.     Cranial Nerves: No cranial nerve deficit.     Coordination: Coordination normal.  Psychiatric:        Mood and Affect: Mood normal.        Behavior: Behavior normal.    ------------------------------------------------------------------------------------------------------------------------------------------------------------------------------------------------------------------- Assessment and Plan  Well adult exam Well adult Orders Placed This Encounter  Procedures   DG Cervical Spine Complete    Standing Status:   Future    Number of Occurrences:   1    Standing Expiration Date:   04/16/2022    Order Specific Question:   Reason for Exam (SYMPTOM  OR DIAGNOSIS REQUIRED)    Answer:   L cervical  radiculitis    Order Specific Question:   Preferred imaging location?    Answer:   [facility]   COMPLETE METABOLIC PANEL WITH GFR   CBC with Differential   Lipid Panel w/reflex Direct LDL   HgB A1c  Immunizations: Up-to-date Screenings: Per lab orders Anticipatory guidance/risk factor reduction: Recommendations per AVS  Cervical radiculitis X-ray of cervical spine ordered. via   No orders of the defined types were placed in this encounter.   No follow-ups on file.    This visit occurred during the SARS-CoV-2 public health emergency.  Safety protocols were in place, including screening questions prior to the visit, additional usage of staff PPE, and extensive cleaning of exam room while observing appropriate contact time as indicated for disinfecting solutions.

## 2021-04-16 NOTE — Patient Instructions (Signed)
Preventive Care 40 Years and Older, Female Preventive care refers to lifestyle choices and visits with your health care provider that can promote health and wellness. Preventive care visits are also called wellness exams. What can I expect for my preventive care visit? Counseling Your health care provider may ask you questions about your: Medical history, including: Past medical problems. Family medical history. Pregnancy and menstrual history. History of falls. Current health, including: Memory and ability to understand (cognition). Emotional well-being. Home life and relationship well-being. Sexual activity and sexual health. Lifestyle, including: Alcohol, nicotine or tobacco, and drug use. Access to firearms. Diet, exercise, and sleep habits. Work and work Statistician. Sunscreen use. Safety issues such as seatbelt and bike helmet use. Physical exam Your health care provider will check your: Height and weight. These may be used to calculate your BMI (body mass index). BMI is a measurement that tells if you are at a healthy weight. Waist circumference. This measures the distance around your waistline. This measurement also tells if you are at a healthy weight and may help predict your risk of certain diseases, such as type 2 diabetes and high blood pressure. Heart rate and blood pressure. Body temperature. Skin for abnormal spots. What immunizations do I need? Vaccines are usually given at various ages, according to a schedule. Your health care provider will recommend vaccines for you based on your age, medical history, and lifestyle or other factors, such as travel or where you work. What tests do I need? Screening Your health care provider may recommend screening tests for certain conditions. This may include: Lipid and cholesterol levels. Hepatitis C test. Hepatitis B test. HIV (human immunodeficiency virus) test. STI (sexually transmitted infection) testing, if you are at  risk. Lung cancer screening. Colorectal cancer screening. Diabetes screening. This is done by checking your blood sugar (glucose) after you have not eaten for a while (fasting). Mammogram. Talk with your health care provider about how often you should have regular mammograms. BRCA-related cancer screening. This may be done if you have a family history of breast, ovarian, tubal, or peritoneal cancers. Bone density scan. This is done to screen for osteoporosis. Talk with your health care provider about your test results, treatment options, and if necessary, the need for more tests. Follow these instructions at home: Eating and drinking  Eat a diet that includes fresh fruits and vegetables, whole grains, lean protein, and low-fat dairy products. Limit your intake of foods with high amounts of sugar, saturated fats, and salt. Take vitamin and mineral supplements as recommended by your health care provider. Do not drink alcohol if your health care provider tells you not to drink. If you drink alcohol: Limit how much you have to 0-1 drink a day. Know how much alcohol is in your drink. In the [place] one drink equals one 12 oz bottle of beer (355 mL), one 5 oz glass of wine (148 mL), or one 1 oz glass of hard liquor (44 mL). Lifestyle Brush your teeth every morning and night with fluoride toothpaste. Floss one time each day. Exercise for at least 30 minutes 5 or more days each week. Do not use any products that contain nicotine or tobacco. These products include cigarettes, chewing tobacco, and vaping devices, such as e-cigarettes. If you need help quitting, ask your health care provider. Do not use drugs. If you are sexually active, practice safe sex. Use a condom or other form of protection in order to prevent STIs. Take aspirin only as told by your  health care provider. Make sure that you understand how much to take and what form to take. Work with your health care provider to find out whether it  is safe and beneficial for you to take aspirin daily. Ask your health care provider if you need to take a cholesterol-lowering medicine (statin). Find healthy ways to manage stress, such as: Meditation, yoga, or listening to music. Journaling. Talking to a trusted person. Spending time with friends and family. Minimize exposure to UV radiation to reduce your risk of skin cancer. Safety Always wear your seat belt while driving or riding in a vehicle. Do not drive: If you have been drinking alcohol. Do not ride with someone who has been drinking. When you are tired or distracted. While texting. If you have been using any mind-altering substances or drugs. Wear a helmet and other protective equipment during sports activities. If you have firearms in your house, make sure you follow all gun safety procedures. What's next? Visit your health care provider once a year for an annual wellness visit. Ask your health care provider how often you should have your eyes and teeth checked. Stay up to date on all vaccines. This information is not intended to replace advice given to you by your health care provider. Make sure you discuss any questions you have with your health care provider. Document Revised: 10/22/2020 Document Reviewed: 10/22/2020 Elsevier Patient Education  [place].

## 2021-04-17 LAB — CBC WITH DIFFERENTIAL/PLATELET
Absolute Monocytes: 782 cells/uL (ref 200–950)
Basophils Absolute: 59 cells/uL (ref 0–200)
Basophils Relative: 0.6 %
Eosinophils Absolute: 168 cells/uL (ref 15–500)
Eosinophils Relative: 1.7 %
HCT: 35.5 % (ref 35.0–45.0)
Hemoglobin: 11.9 g/dL (ref 11.7–15.5)
Lymphs Abs: 2366 cells/uL (ref 850–3900)
MCH: 31.1 pg (ref 27.0–33.0)
MCHC: 33.5 g/dL (ref 32.0–36.0)
MCV: 92.7 fL (ref 80.0–100.0)
MPV: 12 fL (ref 7.5–12.5)
Monocytes Relative: 7.9 %
Neutro Abs: 6524 cells/uL (ref 1500–7800)
Neutrophils Relative %: 65.9 %
Platelets: 338 10*3/uL (ref 140–400)
RBC: 3.83 10*6/uL (ref 3.80–5.10)
RDW: 12.6 % (ref 11.0–15.0)
Total Lymphocyte: 23.9 %
WBC: 9.9 10*3/uL (ref 3.8–10.8)

## 2021-04-17 LAB — COMPLETE METABOLIC PANEL WITH GFR
AG Ratio: 1.4 (calc) (ref 1.0–2.5)
ALT: 12 U/L (ref 6–29)
AST: 17 U/L (ref 10–35)
Albumin: 4.3 g/dL (ref 3.6–5.1)
Alkaline phosphatase (APISO): 76 U/L (ref 37–153)
BUN: 24 mg/dL (ref 7–25)
CO2: 29 mmol/L (ref 20–32)
Calcium: 9.8 mg/dL (ref 8.6–10.4)
Chloride: 103 mmol/L (ref 98–110)
Creat: 0.95 mg/dL (ref 0.60–1.00)
Globulin: 3 g/dL (calc) (ref 1.9–3.7)
Glucose, Bld: 96 mg/dL (ref 65–99)
Potassium: 4.8 mmol/L (ref 3.5–5.3)
Sodium: 141 mmol/L (ref 135–146)
Total Bilirubin: 0.4 mg/dL (ref 0.2–1.2)
Total Protein: 7.3 g/dL (ref 6.1–8.1)
eGFR: 62 mL/min/{1.73_m2} (ref >=60)

## 2021-04-17 LAB — LIPID PANEL W/REFLEX DIRECT LDL
Cholesterol: 223 mg/dL — ABNORMAL HIGH (ref <200)
HDL: 62 mg/dL (ref >=50)
LDL Cholesterol (Calc): 145 mg/dL (calc) — ABNORMAL HIGH
Non-HDL Cholesterol (Calc): 161 mg/dL (calc) — ABNORMAL HIGH (ref <130)
Total CHOL/HDL Ratio: 3.6 (calc) (ref <5.0)
Triglycerides: 66 mg/dL (ref <150)

## 2021-04-17 LAB — HEMOGLOBIN A1C
Hgb A1c MFr Bld: 5.7 % of total Hgb — ABNORMAL HIGH (ref <5.7)
Mean Plasma Glucose: 117 mg/dL
eAG (mmol/L): 6.5 mmol/L

## 2021-04-21 ENCOUNTER — Other Ambulatory Visit: Payer: Self-pay

## 2021-04-21 DIAGNOSIS — R232 Flushing: Secondary | ICD-10-CM

## 2021-04-21 MED ORDER — ESTRADIOL 0.5 MG PO TABS: 0.2500 mg | ORAL_TABLET | Freq: Every day | ORAL | 3 refills | Status: DC

## 2021-04-29 ENCOUNTER — Telehealth (INDEPENDENT_AMBULATORY_CARE_PROVIDER_SITE_OTHER): Payer: Medicare Other | Admitting: Sports Medicine

## 2021-04-29 ENCOUNTER — Other Ambulatory Visit: Payer: Self-pay

## 2021-04-29 DIAGNOSIS — J029 Acute pharyngitis, unspecified: Secondary | ICD-10-CM

## 2021-04-29 MED ORDER — MELOXICAM 15 MG PO TABS: ORAL_TABLET | ORAL | 3 refills | Status: DC

## 2021-04-29 MED ORDER — PHENOL 1.4 % MT LIQD: 1.0000 | OROMUCOSAL | 11 refills | Status: DC | PRN

## 2021-04-29 NOTE — Assessment & Plan Note (Signed)
This is a pleasant 75 year old female, no sick contacts, she has had 2 to 3 days of sore throat, minimal runny nose, no headaches, fevers, chills, muscle aches, body aches, no facial pain or pressure. No GI symptoms, no rash. Speaking full sentences on the phone. This is likely a viral pharyngitis, we will treat her conservatively with analgesic throat spray, meloxicam, hydration, she is unable to do salt water gargles. If she is not better in a week she can come back to see [place] in person.

## 2021-04-29 NOTE — Progress Notes (Signed)
Cynthia Adams has had a sore throat and sinus drainage since Sunday. All drainage is clear, no headache, no fever or facial pressure.   Cynthia Adams prefers a phone call [phone]

## 2021-04-29 NOTE — Progress Notes (Signed)
° °  Virtual Visit via Telephone   I connected with  Cynthia Adams  on 04/29/21 by telephone/telehealth and verified that I am speaking with the correct person using two identifiers.   I discussed the limitations, risks, security and privacy concerns of performing an evaluation and management service by telephone, including the higher likelihood of inaccurate diagnosis and treatment, and the availability of in person appointments.  We also discussed the likely need of an additional face to face encounter for complete and high quality delivery of care.  I also discussed with the patient that there may be a patient responsible charge related to this service. The patient expressed understanding and wishes to proceed.  Provider location is in medical facility. Patient location is at their home, different from provider location. People involved in care of the patient during this telehealth encounter were myself, my nurse/medical assistant, and my front office/scheduling team member.  Review of Systems: No fevers, chills, night sweats, weight loss, chest pain, or shortness of breath.   Objective Findings:    General: Speaking full sentences, no audible heavy breathing.  Sounds alert and appropriately interactive.    Independent interpretation of tests performed by another provider:   None.  Brief History, Exam, Impression, and Recommendations:    Acute pharyngitis This is a pleasant 75 year old female, no sick contacts, she has had 2 to 3 days of sore throat, minimal runny nose, no headaches, fevers, chills, muscle aches, body aches, no facial pain or pressure. No GI symptoms, no rash. Speaking full sentences on the phone. This is likely a viral pharyngitis, we will treat her conservatively with analgesic throat spray, meloxicam, hydration, she is unable to do salt water gargles. If she is not better in a week she can come back to see [place] in person.  I discussed the above assessment and  treatment plan with the patient. The patient was provided an opportunity to ask questions and all were answered. The patient agreed with the plan and demonstrated an understanding of the instructions.   The patient was advised to call back or seek an in-person evaluation if the symptoms worsen or if the condition fails to improve as anticipated.   I provided 30 minutes of verbal and non-verbal time during this encounter date, time was needed to gather information, review chart, records, communicate/coordinate with staff remotely, as well as complete documentation.   ___________________________________________ Gwen Her. Dianah Field, M.D., ABFM., CAQSM. Primary Care and Sports Medicine [facility]  Adjunct Professor of [employer]

## 2021-04-30 ENCOUNTER — Telehealth: Payer: Medicare Other | Admitting: Family Medicine

## 2021-06-19 ENCOUNTER — Other Ambulatory Visit: Payer: Self-pay

## 2021-06-19 DIAGNOSIS — R232 Flushing: Secondary | ICD-10-CM

## 2021-06-19 MED ORDER — ESTRADIOL 0.5 MG PO TABS: 0.2500 mg | ORAL_TABLET | Freq: Every day | ORAL | 2 refills | Status: DC

## 2021-06-24 ENCOUNTER — Other Ambulatory Visit: Payer: Self-pay

## 2021-06-24 ENCOUNTER — Ambulatory Visit (INDEPENDENT_AMBULATORY_CARE_PROVIDER_SITE_OTHER): Payer: Medicare Other

## 2021-06-24 DIAGNOSIS — Z1231 Encounter for screening mammogram for malignant neoplasm of breast: Secondary | ICD-10-CM

## 2021-06-30 DIAGNOSIS — J300 Vasomotor rhinitis: Secondary | ICD-10-CM | POA: Diagnosis not present

## 2021-07-02 DIAGNOSIS — G2581 Restless legs syndrome: Secondary | ICD-10-CM | POA: Diagnosis not present

## 2021-07-02 DIAGNOSIS — G603 Idiopathic progressive neuropathy: Secondary | ICD-10-CM | POA: Diagnosis not present

## 2021-07-02 DIAGNOSIS — Z79899 Other long term (current) drug therapy: Secondary | ICD-10-CM | POA: Diagnosis not present

## 2021-07-02 DIAGNOSIS — M5412 Radiculopathy, cervical region: Secondary | ICD-10-CM | POA: Diagnosis not present

## 2021-07-02 DIAGNOSIS — M5417 Radiculopathy, lumbosacral region: Secondary | ICD-10-CM | POA: Diagnosis not present

## 2021-07-06 DIAGNOSIS — L579 Skin changes due to chronic exposure to nonionizing radiation, unspecified: Secondary | ICD-10-CM | POA: Diagnosis not present

## 2021-07-06 DIAGNOSIS — L719 Rosacea, unspecified: Secondary | ICD-10-CM | POA: Diagnosis not present

## 2021-07-06 DIAGNOSIS — D235 Other benign neoplasm of skin of trunk: Secondary | ICD-10-CM | POA: Diagnosis not present

## 2021-07-06 DIAGNOSIS — L814 Other melanin hyperpigmentation: Secondary | ICD-10-CM | POA: Diagnosis not present

## 2021-07-06 DIAGNOSIS — L821 Other seborrheic keratosis: Secondary | ICD-10-CM | POA: Diagnosis not present

## 2021-08-02 ENCOUNTER — Other Ambulatory Visit: Payer: Self-pay | Admitting: Osteopathic Medicine

## 2021-08-02 ENCOUNTER — Other Ambulatory Visit: Payer: Self-pay | Admitting: Sports Medicine

## 2021-09-10 DIAGNOSIS — G2581 Restless legs syndrome: Secondary | ICD-10-CM | POA: Diagnosis not present

## 2021-09-10 DIAGNOSIS — M545 Low back pain, unspecified: Secondary | ICD-10-CM | POA: Diagnosis not present

## 2021-09-10 DIAGNOSIS — G5603 Carpal tunnel syndrome, bilateral upper limbs: Secondary | ICD-10-CM | POA: Diagnosis not present

## 2021-09-10 DIAGNOSIS — M542 Cervicalgia: Secondary | ICD-10-CM | POA: Diagnosis not present

## 2021-11-04 ENCOUNTER — Other Ambulatory Visit: Payer: Self-pay | Admitting: Osteopathic Medicine

## 2021-11-09 ENCOUNTER — Encounter: Payer: Self-pay | Admitting: Medical-Surgical

## 2021-11-09 ENCOUNTER — Ambulatory Visit (INDEPENDENT_AMBULATORY_CARE_PROVIDER_SITE_OTHER): Payer: Medicare Other | Admitting: Medical-Surgical

## 2021-11-09 VITALS — BP 180/67 | HR 60 | Resp 20 | Ht 60.0 in | Wt 131.0 lb

## 2021-11-09 DIAGNOSIS — N3001 Acute cystitis with hematuria: Secondary | ICD-10-CM | POA: Diagnosis not present

## 2021-11-09 DIAGNOSIS — R300 Dysuria: Secondary | ICD-10-CM

## 2021-11-09 LAB — POCT URINALYSIS DIP (CLINITEK)
Bilirubin, UA: NEGATIVE
Glucose, UA: NEGATIVE mg/dL
Ketones, POC UA: NEGATIVE mg/dL
Nitrite, UA: POSITIVE — AB
POC PROTEIN,UA: 100 — AB
Spec Grav, UA: 1.02 (ref 1.010–1.025)
Urobilinogen, UA: 0.2 E.U./dL
pH, UA: 5 (ref 5.0–8.0)

## 2021-11-09 MED ORDER — NITROFURANTOIN MONOHYD MACRO 100 MG PO CAPS: 100.0000 mg | ORAL_CAPSULE | Freq: Two times a day (BID) | ORAL | 0 refills | Status: DC

## 2021-11-09 NOTE — Progress Notes (Signed)
Established Patient Office Visit  Subjective   Patient ID: Cynthia Adams, female   DOB: 1946/04/03 Age: 76 y.o. MRN: [id number]   Chief Complaint  Patient presents with   Dysuria    HPI Pleasant 76 year old female presenting today with reports of approximately 1 week of urinary symptoms including burning, frequency, urgency, and suprapubic pressure.  Notes that she has had recurrent UTIs since her neck surgery requiring catheterization and is not sure if something was messed up with the catheter placement at that time.  She does work to stay well-hydrated but could probably drink more water.  Follows good bladder hygiene.  Notes an allergy to several medications that are more intolerances.  Was able to take Macrobid with food and tolerated without difficulty.  Notes this cleared her symptoms up last time.   Objective:    Vitals:   11/09/21 1142  BP: (!) 180/67  Pulse: 60  Resp: 20  Height: 5' (1.524 m)  Weight: 131 lb (59.4 kg)  SpO2: 99%  BMI (Calculated): 25.58   Physical Exam Vitals and nursing note reviewed.  Constitutional:      General: She is not in acute distress.    Appearance: Normal appearance. She is not ill-appearing.  HENT:     Head: Normocephalic and atraumatic.  Cardiovascular:     Rate and Rhythm: Normal rate and regular rhythm.     Pulses: Normal pulses.     Heart sounds: Normal heart sounds.  Pulmonary:     Effort: Pulmonary effort is normal. No respiratory distress.     Breath sounds: Normal breath sounds. No wheezing, rhonchi or rales.  Skin:    General: Skin is warm and dry.  Neurological:     Mental Status: She is alert and oriented to person, place, and time.  Psychiatric:        Mood and Affect: Mood normal.        Behavior: Behavior normal.        Thought Content: Thought content normal.        Judgment: Judgment normal.    Results for orders placed or performed in visit on 11/09/21 (from the past 24 hour(s))  POCT URINALYSIS DIP  (CLINITEK)     Status: Abnormal   Collection Time: 11/09/21 11:39 AM  Result Value Ref Range   Color, UA yellow yellow   Clarity, UA clear clear   Glucose, UA negative negative mg/dL   Bilirubin, UA negative negative   Ketones, POC UA negative negative mg/dL   Spec Grav, UA 1.020 1.010 - 1.025   Blood, UA moderate (A) negative   pH, UA 5.0 5.0 - 8.0   POC PROTEIN,UA =100 (A) negative, trace   Urobilinogen, UA 0.2 0.2 or 1.0 E.U./dL   Nitrite, UA Positive (A) Negative   Leukocytes, UA Large (3+) (A) Negative      The ASCVD Risk score (Arnett DK, et al., 2019) failed to calculate for the following reasons:   The patient has a prior MI or stroke diagnosis   Assessment & Plan:   1. Dysuria POCT urinalysis positive for moderate blood, nitrites, and large leukocytes.  Protein also seen.  Sending for culture. - POCT URINALYSIS DIP (CLINITEK) - Urine Culture  2. Acute cystitis with hematuria Reviewed recent urine cultures and response to medications.  She did well with Macrobid last time as long as she took it with food and it looks like this is susceptible on her 2 previous urine cultures.  We will go  ahead and start with Macrobid 100 mg twice daily for the next 5 days.  If culture returns showing resistance to Macrobid, may need to change her antibiotic.  At that course that she will likely need a third-generation cephalosporin since she is intolerant/allergic to most of the other options.  Return if symptoms worsen or fail to improve.  ___________________________________________ Clearnce Sorrel, DNP, APRN, FNP-BC Primary Care and [place]

## 2021-11-11 ENCOUNTER — Other Ambulatory Visit: Payer: Self-pay | Admitting: Family Medicine

## 2021-11-11 DIAGNOSIS — R232 Flushing: Secondary | ICD-10-CM

## 2021-11-12 LAB — URINE CULTURE
MICRO NUMBER:: 13601088
SPECIMEN QUALITY:: ADEQUATE

## 2021-12-17 DIAGNOSIS — Z79899 Other long term (current) drug therapy: Secondary | ICD-10-CM | POA: Diagnosis not present

## 2021-12-17 DIAGNOSIS — M542 Cervicalgia: Secondary | ICD-10-CM | POA: Diagnosis not present

## 2021-12-17 DIAGNOSIS — G2581 Restless legs syndrome: Secondary | ICD-10-CM | POA: Diagnosis not present

## 2021-12-17 DIAGNOSIS — M545 Low back pain, unspecified: Secondary | ICD-10-CM | POA: Diagnosis not present

## 2022-02-03 ENCOUNTER — Other Ambulatory Visit: Payer: Self-pay | Admitting: Family Medicine

## 2022-02-09 ENCOUNTER — Ambulatory Visit (INDEPENDENT_AMBULATORY_CARE_PROVIDER_SITE_OTHER): Payer: Medicare Other | Admitting: Family Medicine

## 2022-02-09 VITALS — Temp 97.7°F

## 2022-02-09 DIAGNOSIS — Z23 Encounter for immunization: Secondary | ICD-10-CM | POA: Diagnosis not present

## 2022-02-09 NOTE — Progress Notes (Signed)
Pt here for flu shot. Afebrile,no recent illness. Vaccination given, pt tolerated well.  

## 2022-02-15 ENCOUNTER — Ambulatory Visit (INDEPENDENT_AMBULATORY_CARE_PROVIDER_SITE_OTHER): Payer: Medicare Other | Admitting: Family Medicine

## 2022-02-15 DIAGNOSIS — Z0000 Encounter for general adult medical examination without abnormal findings: Secondary | ICD-10-CM | POA: Diagnosis not present

## 2022-02-15 NOTE — Progress Notes (Signed)
MEDICARE ANNUAL WELLNESS VISIT  02/15/2022  Telephone Visit Disclaimer This Medicare AWV was conducted by telephone due to national recommendations for restrictions regarding the COVID-19 Pandemic (e.g. social distancing).  I verified, using two identifiers, that I am speaking with Cynthia Adams or their authorized healthcare agent. I discussed the limitations, risks, security, and privacy concerns of performing an evaluation and management service by telephone and the potential availability of an in-person appointment in the future. The patient expressed understanding and agreed to proceed.  Location of Patient: Home Location of Provider (nurse):  In the office.  Subjective:    Cynthia Adams is a 76 y.o. female patient of Cynthia Nutting, DO who had a Medicare Annual Wellness Visit today via telephone. Cynthia Adams is Retired and lives with their spouse. she has 2 children. she reports that she is socially active and does interact with friends/family regularly. she is minimally physically active and enjoys reading.  Patient Care Team: Cynthia Nutting, DO as PCP - General (Family Medicine) Pixie Casino, MD as PCP - Cardiology (Cardiology)     02/15/2022    9:33 AM 02/13/2021    9:46 AM 02/14/2020    4:03 PM 08/13/2019    9:37 AM 06/13/2018    9:27 AM 05/28/2016    4:05 PM 05/28/2016    9:40 AM  Advanced Directives  Does Patient Have a Medical Advance Directive? Yes Yes Yes Yes Yes  Yes  Type of Advance Directive Living will [place];Living will [place];Living will [place];Living will [place];Living will [place];Living will [place];Living will  Does patient want to make changes to medical advance directive? No - Patient declined No - Patient declined No - Patient declined  No - Patient declined    Copy of [place] in Chart?  No - copy requested No -  copy requested  No - copy requested No - copy requested No - copy requested    Hospital Utilization Over the Past 12 Months: # of hospitalizations or ER visits: 0 # of surgeries: 0  Review of Systems    Patient reports that Cynthia Adams overall health is unchanged compared to last year.  History obtained from chart review and the patient  Patient Reported Readings (BP, Pulse, CBG, Weight, etc) none  Pain Assessment Pain : No/denies pain     Current Medications & Allergies (verified) Allergies as of 02/15/2022       Reactions   Amoxicillin    Lovaza [omega-3-acid Ethyl Esters] Diarrhea   Macrobid [nitrofurantoin]    Minocycline Diarrhea, Nausea And Vomiting, Other (See Comments)   Stomach pain   Pantoprazole Swelling   Sulfa Antibiotics Swelling   Amoxicillin-pot Clavulanate Diarrhea   Lipitor [atorvastatin] Other (See Comments)   Diarrhea and insomnia   Pravastatin Diarrhea   Rosuvastatin Swelling        Medication List        Accurate as of February 15, 2022  9:44 AM. If you have any questions, ask your nurse or doctor.          aspirin 81 MG tablet Take 81 mg by mouth daily.   B-12 PO Take 1 mL by mouth daily.   BENEFIBER DRINK MIX PO Take by mouth daily. Pt takes 2 teaspoons in the morning.   estradiol 0.5 MG tablet Commonly known as: ESTRACE TAKE ONE-HALF TABLET BY MOUTH  DAILY   fluticasone 50 MCG/ACT  nasal spray Commonly known as: FLONASE USE 2 SPRAYS IN EACH  NOSTRIL DAILY What changed:  how much to take how to take this when to take this additional instructions   gabapentin 600 MG tablet Commonly known as: NEURONTIN TAKE 1 TABLET BY MOUTH IN  THE MORNING, 1 TABLET AT  LUNCH, AND 2 TABLETS EVERY  NIGHT AT BEDTIME   gemfibrozil 600 MG tablet Commonly known as: LOPID TAKE 1 TABLET BY MOUTH  TWICE DAILY BEFORE MEALS   HYDROcodone-acetaminophen 10-325 MG tablet Commonly known as: NORCO Take by mouth.   meloxicam 15 MG tablet Commonly known  as: MOBIC One tab PO qAM with a meal for 2 weeks, then daily prn pain.   metoprolol tartrate 50 MG tablet Commonly known as: LOPRESSOR TAKE 1 TABLET BY MOUTH TWICE  DAILY   metroNIDAZOLE 0.75 % cream Commonly known as: METROCREAM Apply topically.   multivitamin with minerals tablet Take 1 tablet by mouth daily.   omeprazole 40 MG capsule Commonly known as: PRILOSEC Take 40 mg by mouth daily.        History (reviewed): Past Medical History:  Diagnosis Date   Cataract 2017   Cataract surgery in 01-2016 and 02-2016   GERD (gastroesophageal reflux disease)    History of iron deficiency 06/13/2014   Hyperlipidemia    Hypertension    Nerve pain    Seasonal allergies    Past Surgical History:  Procedure Laterality Date   ABDOMINAL HYSTERECTOMY     BREAST BIOPSY Right    BREAST CYST EXCISION Right    CATARACT EXTRACTION W/ INTRAOCULAR LENS  IMPLANT, BILATERAL Bilateral 01-2016, 02-2016   CATARACT EXTRACTION W/ INTRAOCULAR LENS & ANTERIOR VITRECTOMY Bilateral 01-29-2016, 02-19-2016   Right eye in 01-2016 and Left eye 02-2016   CERVICAL FUSION  02/11/2020   CESAREAN SECTION     CESAREAN SECTION     CHOLECYSTECTOMY     LUMBAR [place] SURGERY     TONSILLECTOMY     Family History  Problem Relation Age of Onset   Adams Mother        breast   Dementia Mother    Hypertension Mother    Hyperlipidemia Mother    Breast Adams Mother    Dementia Sister    Hypertension Sister    Hyperlipidemia Sister    Hyperlipidemia Brother    Hypertension Brother    Hypertension Son    Dementia Maternal Aunt    Breast Adams Maternal Aunt    Adams Paternal Uncle    Alcohol abuse Paternal Uncle    Stroke Maternal Grandmother    Heart disease Paternal Grandmother    Hyperlipidemia Sister    Hypertension Sister    Hyperlipidemia Sister    Hypertension Sister    Dementia Maternal Aunt    Breast Adams Maternal Aunt    Dementia Maternal Aunt    Breast Adams Maternal Aunt    Alcohol  abuse Paternal Uncle    Social History   Socioeconomic History   Marital status: Married    Spouse name: John   Number of children: 2   Years of education: 12   Highest education level: 12th grade  Occupational History   Occupation: Glass blower/designer    Comment: retired  Tobacco Use   Smoking status: Never   Smokeless tobacco: Never  Vaping Use   Vaping Use: Never used  Substance and Sexual Activity   Alcohol use: Not Currently    Comment: .   Drug use: Never  Sexual activity: Not Currently  Other Topics Concern   Not on file  Social History Narrative   Lives with Cynthia Adams husband. She has two children. She enjoys reading.   Social Determinants of Health   Financial Resource Strain: Low Risk  (02/15/2022)   Overall Financial Resource Strain (CARDIA)    Difficulty of Paying Living Expenses: Not hard at all  Food Insecurity: No Food Insecurity (02/15/2022)   Hunger Vital Sign    Worried About Running Out of Food in the Last Year: Never true    Ran Out of Food in the Last Year: Never true  Transportation Needs: No Transportation Needs (02/15/2022)   PRAPARE - Hydrologist (Medical): No    Lack of Transportation (Non-Medical): No  Physical Activity: Inactive (02/15/2022)   Exercise Vital Sign    Days of Exercise per Week: 0 days    Minutes of Exercise per Session: 0 min  Stress: No Stress Concern Present (02/15/2022)   [place]    Feeling of Stress : Not at all  Social Connections: [place] (02/15/2022)   Social Connection and Isolation Panel [NHANES]    Frequency of Communication with Friends and Family: Three times a week    Frequency of Social Gatherings with Friends and Family: Twice a week    Attends Religious Services: More than 4 times per year    Active Member of [employer] or Organizations: Yes    Attends Archivist Meetings: More than 4 times per year     Marital Status: Married    Activities of Daily Living    02/15/2022    9:35 AM  In your present state of health, do you have any difficulty performing the following activities:  Hearing? 0  Vision? 0  Difficulty concentrating or making decisions? 1  Comment some memory loss.  Walking or climbing stairs? 0  Dressing or bathing? 0  Doing errands, shopping? 0  Preparing Food and eating ? N  Using the Toilet? N  In the past six months, have you accidently leaked urine? N  Do you have problems with loss of bowel control? N  Managing your Medications? N  Managing your Finances? N  Housekeeping or managing your Housekeeping? N    Patient Education/ Literacy How often do you need to have someone help you when you read instructions, pamphlets, or other written materials from your doctor or pharmacy?: 1 - Never What is the last grade level you completed in school?: 12th grade  Exercise Current Exercise Habits: The patient does not participate in regular exercise at present, Exercise limited by: None identified  Diet Patient reports consuming  2-3  meals a day and 1 snack(s) a day Patient reports that Cynthia Adams primary diet is: Regular Patient reports that she does have regular access to food.   Depression Screen    02/15/2022    9:34 AM 11/09/2021   11:45 AM 02/13/2021    9:50 AM 07/31/2019   10:16 AM 06/13/2018    9:28 AM 09/27/2017   11:16 AM 05/28/2016    4:10 PM  PHQ 2/9 Scores  PHQ - 2 Score 1 0 0 2 0 0 1  PHQ- 9 Score    7        Fall Risk    02/15/2022    9:34 AM 11/09/2021   11:45 AM 04/16/2021    9:24 AM 02/13/2021    9:50 AM 07/31/2019  10:16 AM  Fall Risk   Falls in the past year? 0 0 0 0 0  Number falls in past yr: 0 0 0 0   Injury with Fall? 0 0 0 0   Risk for fall due to : No Fall Risks No Fall Risks No Fall Risks History of fall(s)   Follow up Falls evaluation completed Falls evaluation completed Falls evaluation completed Falls evaluation completed;Education  provided;Falls prevention discussed      Objective:  HEIDIE KRALL seemed alert and oriented and she participated appropriately during our telephone visit.  Blood Pressure Weight BMI  BP Readings from Last 3 Encounters:  11/09/21 (!) 180/67  04/16/21 (!) 141/58  04/07/21 (!) 154/51   Wt Readings from Last 3 Encounters:  11/09/21 131 lb (59.4 kg)  04/16/21 125 lb (56.7 kg)  10/15/20 126 lb 1.9 oz (57.2 kg)   BMI Readings from Last 1 Encounters:  11/09/21 25.58 kg/m    *Unable to obtain current vital signs, weight, and BMI due to telephone visit type  Hearing/Vision  Loella did not seem to have difficulty with hearing/understanding during the telephone conversation Reports that she has had a formal eye exam by an eye care professional within the past year Reports that she has not had a formal hearing evaluation within the past year *Unable to fully assess hearing and vision during telephone visit type  Cognitive Function:    02/15/2022    9:39 AM 02/13/2021    9:53 AM 06/13/2018    9:32 AM 05/28/2016    4:28 PM 05/28/2016    9:39 AM  6CIT Screen  What Year? 0 points 0 points 0 points 0 points 0 points  What month? 0 points 0 points 0 points 0 points 0 points  What time? 0 points 0 points 0 points 0 points 0 points  Count back from 20 0 points 0 points 0 points 0 points 0 points  Months in reverse 0 points 0 points 0 points 0 points 0 points  Repeat phrase 0 points 0 points 0 points 0 points 0 points  Total Score 0 points 0 points 0 points 0 points 0 points   (Normal:0-7, Significant for Dysfunction: >8)  Normal Cognitive Function Screening: Yes   Immunization & Health Maintenance Record Immunization History  Administered Date(s) Administered   Fluad Quad(high Dose 65+) 12/26/2018, 03/05/2020, 02/18/2021, 02/09/2022   Influenza, High Dose Seasonal PF 02/26/2016, 01/25/2018   Influenza-Unspecified 02/04/2012, 01/09/2015, 02/21/2017   PFIZER(Purple Top)SARS-COV-2  Vaccination 07/09/2019, 08/07/2019, 04/18/2020, 12/23/2020   Pneumococcal Conjugate-13 02/26/2016   Pneumococcal Polysaccharide-23 05/08/2006, 05/25/2017   Td 05/10/2004   Tdap 08/16/2014   Zoster Recombinat (Shingrix) 07/28/2017, 10/08/2017   Zoster, Live 03/17/2015    Health Maintenance  Topic Date Due   COVID-19 Vaccine (5 - Pfizer risk series) 04/11/2022 (Originally 02/17/2021)   TETANUS/TDAP  08/15/2024   COLONOSCOPY (Pts 45-28yr Insurance coverage will need to be confirmed)  10/02/2024   Pneumonia Vaccine 76 Years old  Completed   INFLUENZA VACCINE  Completed   DEXA SCAN  Completed   Hepatitis C Screening  Completed   Zoster Vaccines- Shingrix  Completed   HPV VACCINES  Aged Out       Assessment  This is a routine wellness examination for [place]  Health Maintenance: Due or Overdue There are no preventive care reminders to display for this patient.  JBaird Cancerdoes not need a referral for Community Assistance: Care Management:   no Social Work:  no Prescription Assistance:  no Nutrition/Diabetes Education:  no   Plan:  Personalized Goals  Goals Addressed               This Visit's Progress     Patient Stated (pt-stated)        02/15/2022 AWV Goal: Exercise for General Health  Patient will verbalize understanding of the benefits of increased physical activity: Exercising regularly is important. It will improve your overall fitness, flexibility, and endurance. Regular exercise also will improve your overall health. It can help you control your weight, reduce stress, and improve your bone density. Over the next year, patient will increase physical activity as tolerated with a goal of at least 150 minutes of moderate physical activity per week.  You can tell that you are exercising at a moderate intensity if your heart starts beating faster and you start breathing faster but can still hold a conversation. Moderate-intensity exercise ideas  include: Walking 1 mile (1.6 km) in about 15 minutes Biking Hiking Golfing Dancing Water aerobics Patient will verbalize understanding of everyday activities that increase physical activity by providing examples like the following: Yard work, such as: Sales promotion account executive Gardening Washing windows or floors Patient will be able to explain general safety guidelines for exercising:  Before you start a new exercise program, talk with your health care provider. Do not exercise so much that you hurt yourself, feel dizzy, or get very short of breath. Wear comfortable clothes and wear shoes with good support. Drink plenty of water while you exercise to prevent dehydration or heat stroke. Work out until your breathing and your heartbeat get faster.        Personalized Health Maintenance & Screening Recommendations  Mammogram- due in February, 2024  Lung Adams Screening Recommended: no (Low Dose CT Chest recommended if Age 54-80 years, 30 pack-year currently smoking OR have quit w/in past 15 years) Hepatitis C Screening recommended: no HIV Screening recommended: no  Advanced Directives: Written information was not prepared per patient's request.  Referrals & Orders No orders of the defined types were placed in this encounter.   Follow-up Plan Follow-up with Cynthia Nutting, DO as planned Medicare wellness visit in one year. AVS printed and mailed to the patient.   I have personally reviewed and noted the following in the patient's chart:   Medical and social history Use of alcohol, tobacco or illicit drugs  Current medications and supplements Functional ability and status Nutritional status Physical activity Advanced directives List of other physicians Hospitalizations, surgeries, and ER visits in previous 12 months Vitals Screenings to include cognitive, depression, and falls Referrals and  appointments  In addition, I have reviewed and discussed with Cynthia Adams certain preventive protocols, quality metrics, and best practice recommendations. A written personalized care plan for preventive services as well as general preventive health recommendations is available and can be mailed to the patient at Cynthia Adams request.      Tinnie Gens, RN BSN  02/15/2022

## 2022-02-15 NOTE — Patient Instructions (Signed)
[place] Maintenance Summary and Written Plan of Care  [name] ,  Thank you for allowing me to perform your Medicare Annual Wellness Visit and for your ongoing commitment to your health.   Health Maintenance & Immunization History Health Maintenance  Topic Date Due   COVID-19 Vaccine (5 - Pfizer risk series) 04/11/2022 (Originally 02/17/2021)   TETANUS/TDAP  08/15/2024   COLONOSCOPY (Pts 45-75yr Insurance coverage will need to be confirmed)  10/02/2024   Pneumonia Vaccine 76 Years old  Completed   INFLUENZA VACCINE  Completed   DEXA SCAN  Completed   Hepatitis C Screening  Completed   Zoster Vaccines- Shingrix  Completed   HPV VACCINES  Aged Out   Immunization History  Administered Date(s) Administered   Fluad Quad(high Dose 65+) 12/26/2018, 03/05/2020, 02/18/2021, 02/09/2022   Influenza, High Dose Seasonal PF 02/26/2016, 01/25/2018   Influenza-Unspecified 02/04/2012, 01/09/2015, 02/21/2017   PFIZER(Purple Top)SARS-COV-2 Vaccination 07/09/2019, 08/07/2019, 04/18/2020, 12/23/2020   Pneumococcal Conjugate-13 02/26/2016   Pneumococcal Polysaccharide-23 05/08/2006, 05/25/2017   Td 05/10/2004   Tdap 08/16/2014   Zoster Recombinat (Shingrix) 07/28/2017, 10/08/2017   Zoster, Live 03/17/2015    These are the patient goals that we discussed:  Goals Addressed               This Visit's Progress     Patient Stated (pt-stated)        02/15/2022 AWV Goal: Exercise for General Health  Patient will verbalize understanding of the benefits of increased physical activity: Exercising regularly is important. It will improve your overall fitness, flexibility, and endurance. Regular exercise also will improve your overall health. It can help you control your weight, reduce stress, and improve your bone density. Over the next year, patient will increase physical activity as tolerated with a goal of at least 150 minutes of moderate physical activity per  week.  You can tell that you are exercising at a moderate intensity if your heart starts beating faster and you start breathing faster but can still hold a conversation. Moderate-intensity exercise ideas include: Walking 1 mile (1.6 km) in about 15 minutes Biking Hiking Golfing Dancing Water aerobics Patient will verbalize understanding of everyday activities that increase physical activity by providing examples like the following: Yard work, such as: PSales promotion account executiveGardening Washing windows or floors Patient will be able to explain general safety guidelines for exercising:  Before you start a new exercise program, talk with your health care provider. Do not exercise so much that you hurt yourself, feel dizzy, or get very short of breath. Wear comfortable clothes and wear shoes with good support. Drink plenty of water while you exercise to prevent dehydration or heat stroke. Work out until your breathing and your heartbeat get faster.          This is a list of Health Maintenance Items that are overdue or due now: Mammogram- due in February, 2024  Orders/Referrals Placed Today: No orders of the defined types were placed in this encounter.  (Contact our referral department at [phone] you have not spoken with someone about your referral appointment within the next 5 days)    Follow-up Plan Follow-up with MLuetta Nutting DO as planned Medicare wellness visit in one year. AVS printed and mailed to the patient.      Health Maintenance, Female Adopting a healthy lifestyle and getting preventive care are important in promoting health and wellness. Ask  your health care provider about: The right schedule for you to have regular tests and exams. Things you can do on your own to prevent diseases and keep yourself healthy. What should I know about diet, weight, and exercise? Eat a healthy  diet  Eat a diet that includes plenty of vegetables, fruits, low-fat dairy products, and lean protein. Do not eat a lot of foods that are high in solid fats, added sugars, or sodium. Maintain a healthy weight Body mass index (BMI) is used to identify weight problems. It estimates body fat based on height and weight. Your health care provider can help determine your BMI and help you achieve or maintain a healthy weight. Get regular exercise Get regular exercise. This is one of the most important things you can do for your health. Most adults should: Exercise for at least 150 minutes each week. The exercise should increase your heart rate and make you sweat (moderate-intensity exercise). Do strengthening exercises at least twice a week. This is in addition to the moderate-intensity exercise. Spend less time sitting. Even light physical activity can be beneficial. Watch cholesterol and blood lipids Have your blood tested for lipids and cholesterol at 76 years of age, then have this test every 5 years. Have your cholesterol levels checked more often if: Your lipid or cholesterol levels are high. You are older than 76 years of age. You are at high risk for heart disease. What should I know about cancer screening? Depending on your health history and family history, you may need to have cancer screening at various ages. This may include screening for: Breast cancer. Cervical cancer. Colorectal cancer. Skin cancer. Lung cancer. What should I know about heart disease, diabetes, and high blood pressure? Blood pressure and heart disease High blood pressure causes heart disease and increases the risk of stroke. This is more likely to develop in people who have high blood pressure readings or are overweight. Have your blood pressure checked: Every 3-5 years if you are 25-54 years of age. Every year if you are 21 years old or older. Diabetes Have regular diabetes screenings. This checks your  fasting blood sugar level. Have the screening done: Once every three years after age 50 if you are at a normal weight and have a low risk for diabetes. More often and at a younger age if you are overweight or have a high risk for diabetes. What should I know about preventing infection? Hepatitis B If you have a higher risk for hepatitis B, you should be screened for this virus. Talk with your health care provider to find out if you are at risk for hepatitis B infection. Hepatitis C Testing is recommended for: Everyone born from [birth year] through 1965. Anyone with known risk factors for hepatitis C. Sexually transmitted infections (STIs) Get screened for STIs, including gonorrhea and chlamydia, if: You are sexually active and are younger than 76 years of age. You are older than 76 years of age and your health care provider tells you that you are at risk for this type of infection. Your sexual activity has changed since you were last screened, and you are at increased risk for chlamydia or gonorrhea. Ask your health care provider if you are at risk. Ask your health care provider about whether you are at high risk for HIV. Your health care provider may recommend a prescription medicine to help prevent HIV infection. If you choose to take medicine to prevent HIV, you should first get tested for HIV. You  should then be tested every 3 months for as long as you are taking the medicine. Pregnancy If you are about to stop having your period (premenopausal) and you may become pregnant, seek counseling before you get pregnant. Take 400 to 800 micrograms (mcg) of folic acid every day if you become pregnant. Ask for birth control (contraception) if you want to prevent pregnancy. Osteoporosis and menopause Osteoporosis is a disease in which the bones lose minerals and strength with aging. This can result in bone fractures. If you are 39 years old or older, or if you are at risk for osteoporosis and fractures, ask  your health care provider if you should: Be screened for bone loss. Take a calcium or vitamin D supplement to lower your risk of fractures. Be given hormone replacement therapy (HRT) to treat symptoms of menopause. Follow these instructions at home: Alcohol use Do not drink alcohol if: Your health care provider tells you not to drink. You are pregnant, may be pregnant, or are planning to become pregnant. If you drink alcohol: Limit how much you have to: 0-1 drink a day. Know how much alcohol is in your drink. In the [place] one drink equals one 12 oz bottle of beer (355 mL), one 5 oz glass of wine (148 mL), or one 1 oz glass of hard liquor (44 mL). Lifestyle Do not use any products that contain nicotine or tobacco. These products include cigarettes, chewing tobacco, and vaping devices, such as e-cigarettes. If you need help quitting, ask your health care provider. Do not use street drugs. Do not share needles. Ask your health care provider for help if you need support or information about quitting drugs. General instructions Schedule regular health, dental, and eye exams. Stay current with your vaccines. Tell your health care provider if: You often feel depressed. You have ever been abused or do not feel safe at home. Summary Adopting a healthy lifestyle and getting preventive care are important in promoting health and wellness. Follow your health care provider's instructions about healthy diet, exercising, and getting tested or screened for diseases. Follow your health care provider's instructions on monitoring your cholesterol and blood pressure. This information is not intended to replace advice given to you by your health care provider. Make sure you discuss any questions you have with your health care provider. Document Revised: 09/15/2020 Document Reviewed: 09/15/2020 Elsevier Patient Education  [place].

## 2022-03-25 ENCOUNTER — Other Ambulatory Visit: Payer: Self-pay | Admitting: Family Medicine

## 2022-03-25 DIAGNOSIS — Z1231 Encounter for screening mammogram for malignant neoplasm of breast: Secondary | ICD-10-CM

## 2022-03-25 DIAGNOSIS — Z79899 Other long term (current) drug therapy: Secondary | ICD-10-CM | POA: Diagnosis not present

## 2022-03-25 DIAGNOSIS — M545 Low back pain, unspecified: Secondary | ICD-10-CM | POA: Diagnosis not present

## 2022-03-25 DIAGNOSIS — M542 Cervicalgia: Secondary | ICD-10-CM | POA: Diagnosis not present

## 2022-03-25 DIAGNOSIS — G2581 Restless legs syndrome: Secondary | ICD-10-CM | POA: Diagnosis not present

## 2022-06-21 ENCOUNTER — Ambulatory Visit (INDEPENDENT_AMBULATORY_CARE_PROVIDER_SITE_OTHER): Payer: Medicare Other | Admitting: Family Medicine

## 2022-06-21 ENCOUNTER — Encounter: Payer: Self-pay | Admitting: Family Medicine

## 2022-06-21 VITALS — BP 152/68 | HR 60 | Ht 60.0 in | Wt 128.0 lb

## 2022-06-21 DIAGNOSIS — N3001 Acute cystitis with hematuria: Secondary | ICD-10-CM

## 2022-06-21 DIAGNOSIS — N390 Urinary tract infection, site not specified: Secondary | ICD-10-CM

## 2022-06-21 LAB — POCT URINALYSIS DIP (CLINITEK)
Glucose, UA: NEGATIVE mg/dL
Ketones, POC UA: NEGATIVE mg/dL
Nitrite, UA: NEGATIVE
POC PROTEIN,UA: >=300 — AB
Spec Grav, UA: >=1.03 — AB (ref 1.010–1.025)
Urobilinogen, UA: 1 E.U./dL
pH, UA: 5.5 (ref 5.0–8.0)

## 2022-06-21 MED ORDER — CEPHALEXIN 500 MG PO CAPS: 500.0000 mg | ORAL_CAPSULE | Freq: Three times a day (TID) | ORAL | 0 refills | Status: AC

## 2022-06-21 NOTE — Patient Instructions (Signed)
I have called in cephalexin.  Stay well hydrated.  You can try holding gabapentin and estrogen.  Keep me updated on how you are doing without this.

## 2022-06-21 NOTE — Assessment & Plan Note (Signed)
Urinalysis consistent with UTI.  Sending for culture.  Will go ahead and start Keflex x 5-day course.  She will let me know if symptoms are worsening.

## 2022-06-21 NOTE — Progress Notes (Signed)
Cynthia Adams - 77 y.o. female MRN [id number]  Date of birth: January 15, 1946  Subjective Chief Complaint  Patient presents with   Urinary Tract Infection    HPI LANIYA Adams is a 77 y.o. female here today with symptoms of UTI.  She is having burning sensation when urinating as well as frequency and urgency.  She denies abdominal or back pain.  He has not had fever.  She has had menopausal symptoms for several years and has been on estradiol. She would like to discontinue this as she feels that she does not need it anymore.  ROS:  A comprehensive ROS was completed and negative except as noted per HPI    Allergies  Allergen Reactions   Amoxicillin    Lovaza [Omega-3-Acid Ethyl Esters] Diarrhea   Macrobid [Nitrofurantoin]    Minocycline Diarrhea, Nausea And Vomiting and Other (See Comments)    Stomach pain     Pantoprazole Swelling   Sulfa Antibiotics Swelling   Amoxicillin-Pot Clavulanate Diarrhea   Lipitor [Atorvastatin] Other (See Comments)    Diarrhea and insomnia   Pravastatin Diarrhea   Rosuvastatin Swelling    Past Medical History:  Diagnosis Date   Cataract 2017   Cataract surgery in 01-2016 and 02-2016   GERD (gastroesophageal reflux disease)    History of iron deficiency 06/13/2014   Hyperlipidemia    Hypertension    Nerve pain    Seasonal allergies     Past Surgical History:  Procedure Laterality Date   ABDOMINAL HYSTERECTOMY     BREAST BIOPSY Right    BREAST CYST EXCISION Right    CATARACT EXTRACTION W/ INTRAOCULAR LENS  IMPLANT, BILATERAL Bilateral 01-2016, 02-2016   CATARACT EXTRACTION W/ INTRAOCULAR LENS & ANTERIOR VITRECTOMY Bilateral 01-29-2016, 02-19-2016   Right eye in 01-2016 and Left eye 02-2016   CERVICAL FUSION  02/11/2020   CESAREAN SECTION     CESAREAN SECTION     CHOLECYSTECTOMY     LUMBAR [place] SURGERY     TONSILLECTOMY      Social History   Socioeconomic History   Marital status: Married    Spouse name: John   Number of  children: 2   Years of education: 12   Highest education level: 12th grade  Occupational History   Occupation: Glass blower/designer    Comment: retired  Tobacco Use   Smoking status: Never   Smokeless tobacco: Never  Vaping Use   Vaping Use: Never used  Substance and Sexual Activity   Alcohol use: Not Currently    Comment: .   Drug use: Never   Sexual activity: Not Currently  Other Topics Concern   Not on file  Social History Narrative   Lives with her husband. She has two children. She enjoys reading.   Social Determinants of Health   Financial Resource Strain: Low Risk  (02/15/2022)   Overall Financial Resource Strain (CARDIA)    Difficulty of Paying Living Expenses: Not hard at all  Food Insecurity: No Food Insecurity (02/15/2022)   Hunger Vital Sign    Worried About Running Out of Food in the Last Year: Never true    Ran Out of Food in the Last Year: Never true  Transportation Needs: No Transportation Needs (02/15/2022)   PRAPARE - Hydrologist (Medical): No    Lack of Transportation (Non-Medical): No  Physical Activity: Inactive (02/15/2022)   Exercise Vital Sign    Days of Exercise per Week: 0 days    Minutes  of Exercise per Session: 0 min  Stress: No Stress Concern Present (02/15/2022)   [place]    Feeling of Stress : Not at all  Social Connections: [place] (02/15/2022)   Social Connection and Isolation Panel [NHANES]    Frequency of Communication with Friends and Family: Three times a week    Frequency of Social Gatherings with Friends and Family: Twice a week    Attends Religious Services: More than 4 times per year    Active Member of Clubs or Organizations: Yes    Attends Music therapist: More than 4 times per year    Marital Status: Married    Family History  Problem Relation Age of Onset   Cancer Mother        breast   Dementia Mother     Hypertension Mother    Hyperlipidemia Mother    Breast cancer Mother    Dementia Sister    Hypertension Sister    Hyperlipidemia Sister    Hyperlipidemia Brother    Hypertension Brother    Hypertension Son    Dementia Maternal Aunt    Breast cancer Maternal Aunt    Cancer Paternal Uncle    Alcohol abuse Paternal Uncle    Stroke Maternal Grandmother    Heart disease Paternal Grandmother    Hyperlipidemia Sister    Hypertension Sister    Hyperlipidemia Sister    Hypertension Sister    Dementia Maternal Aunt    Breast cancer Maternal Aunt    Dementia Maternal Aunt    Breast cancer Maternal Aunt    Alcohol abuse Paternal Uncle     Health Maintenance  Topic Date Due   COVID-19 Vaccine (6 - 2023-24 season) 07/07/2022 (Originally 04/23/2022)   Medicare Annual Wellness (AWV)  02/16/2023   DTaP/Tdap/Td (3 - Td or Tdap) 08/15/2024   Pneumonia Vaccine 75+ Years old  Completed   INFLUENZA VACCINE  Completed   DEXA SCAN  Completed   Hepatitis C Screening  Completed   Zoster Vaccines- Shingrix  Completed   HPV VACCINES  Aged Out   COLONOSCOPY (Pts 45-5yr Insurance coverage will need to be confirmed)  Discontinued     ----------------------------------------------------------------------------------------------------------------------------------------------------------------------------------------------------------------- Physical Exam BP (!) 152/68 (BP Location: Left Arm, Patient Position: Sitting, Cuff Size: Normal)   Pulse 60   Ht 5' (1.524 m)   Wt 128 lb (58.1 kg)   SpO2 94%   BMI 25.00 kg/m   Physical Exam Constitutional:      Appearance: Normal appearance.  HENT:     Head: Normocephalic and atraumatic.  Eyes:     General: No scleral icterus. Abdominal:     Palpations: Abdomen is soft.     Tenderness: There is no right CVA tenderness or left CVA tenderness.  Neurological:     Mental Status: She is alert.  Psychiatric:        Mood and Affect: Mood normal.         Behavior: Behavior normal.     ------------------------------------------------------------------------------------------------------------------------------------------------------------------------------------------------------------------- Assessment and Plan  Acute cystitis with hematuria Urinalysis consistent with UTI.  Sending for culture.  Will go ahead and start Keflex x 5-day course.  She will let me know if symptoms are worsening.   Meds ordered this encounter  Medications   cephALEXin (KEFLEX) 500 MG capsule    Sig: Take 1 capsule (500 mg total) by mouth 3 (three) times daily for 5 days.    Dispense:  15 capsule  Refill:  0    No follow-ups on file.    This visit occurred during the SARS-CoV-2 public health emergency.  Safety protocols were in place, including screening questions prior to the visit, additional usage of staff PPE, and extensive cleaning of exam room while observing appropriate contact time as indicated for disinfecting solutions.

## 2022-06-23 LAB — URINE CULTURE
MICRO NUMBER:: 14552150
SPECIMEN QUALITY:: ADEQUATE

## 2022-06-30 ENCOUNTER — Ambulatory Visit (INDEPENDENT_AMBULATORY_CARE_PROVIDER_SITE_OTHER): Payer: Medicare Other

## 2022-06-30 DIAGNOSIS — Z1231 Encounter for screening mammogram for malignant neoplasm of breast: Secondary | ICD-10-CM

## 2022-07-01 DIAGNOSIS — M542 Cervicalgia: Secondary | ICD-10-CM | POA: Diagnosis not present

## 2022-07-12 DIAGNOSIS — L821 Other seborrheic keratosis: Secondary | ICD-10-CM | POA: Diagnosis not present

## 2022-07-12 DIAGNOSIS — D235 Other benign neoplasm of skin of trunk: Secondary | ICD-10-CM | POA: Diagnosis not present

## 2022-07-12 DIAGNOSIS — L720 Epidermal cyst: Secondary | ICD-10-CM | POA: Diagnosis not present

## 2022-07-12 DIAGNOSIS — D225 Melanocytic nevi of trunk: Secondary | ICD-10-CM | POA: Diagnosis not present

## 2022-07-12 DIAGNOSIS — D2239 Melanocytic nevi of other parts of face: Secondary | ICD-10-CM | POA: Diagnosis not present

## 2022-07-12 DIAGNOSIS — D485 Neoplasm of uncertain behavior of skin: Secondary | ICD-10-CM | POA: Diagnosis not present

## 2022-07-12 DIAGNOSIS — L814 Other melanin hyperpigmentation: Secondary | ICD-10-CM | POA: Diagnosis not present

## 2022-07-12 DIAGNOSIS — L579 Skin changes due to chronic exposure to nonionizing radiation, unspecified: Secondary | ICD-10-CM | POA: Diagnosis not present

## 2022-07-19 DIAGNOSIS — M542 Cervicalgia: Secondary | ICD-10-CM | POA: Diagnosis not present

## 2022-07-28 ENCOUNTER — Other Ambulatory Visit: Payer: Self-pay | Admitting: Family Medicine

## 2022-08-25 ENCOUNTER — Other Ambulatory Visit: Payer: Self-pay | Admitting: Family Medicine

## 2022-08-25 DIAGNOSIS — R232 Flushing: Secondary | ICD-10-CM

## 2022-08-27 ENCOUNTER — Telehealth: Payer: Self-pay

## 2022-08-27 NOTE — Telephone Encounter (Signed)
Pt lvm requesting to restart estradiol due to returning hot flashes.   Per EP[facility], Estrace was refilled on 08/25/22 thru [facility].   Returned patient's call. Advised per last OV not Dr. Ashley Royalty noted she was taking the medication. Refills forthcoming.

## 2022-09-30 DIAGNOSIS — Z760 Encounter for issue of repeat prescription: Secondary | ICD-10-CM | POA: Diagnosis not present

## 2022-09-30 DIAGNOSIS — M542 Cervicalgia: Secondary | ICD-10-CM | POA: Diagnosis not present

## 2022-11-01 ENCOUNTER — Ambulatory Visit (INDEPENDENT_AMBULATORY_CARE_PROVIDER_SITE_OTHER): Payer: Medicare Other | Admitting: Family Medicine

## 2022-11-01 VITALS — BP 144/62 | HR 49 | Ht 60.0 in | Wt 126.0 lb

## 2022-11-01 DIAGNOSIS — R8290 Unspecified abnormal findings in urine: Secondary | ICD-10-CM

## 2022-11-01 DIAGNOSIS — N390 Urinary tract infection, site not specified: Secondary | ICD-10-CM | POA: Diagnosis not present

## 2022-11-01 LAB — POCT URINALYSIS DIP (CLINITEK)
Bilirubin, UA: NEGATIVE
Glucose, UA: NEGATIVE mg/dL
Ketones, POC UA: NEGATIVE mg/dL
Nitrite, UA: NEGATIVE
POC PROTEIN,UA: 100 — AB
Spec Grav, UA: 1.015 (ref 1.010–1.025)
Urobilinogen, UA: 0.2 E.U./dL
pH, UA: 6 (ref 5.0–8.0)

## 2022-11-01 MED ORDER — CEPHALEXIN 500 MG PO CAPS: 500.0000 mg | ORAL_CAPSULE | Freq: Three times a day (TID) | ORAL | 0 refills | Status: AC

## 2022-11-01 NOTE — Patient Instructions (Signed)
Prescription sent to [facility]. Pt is aware.

## 2022-11-01 NOTE — Progress Notes (Signed)
SUBJECTIVE: Cynthia Adams is a 77 y.o. female who complains of urinary frequency, urgency and dysuria x 7 days, without flank pain, fever, chills, or abnormal vaginal discharge or bleeding.   OBJECTIVE: Appears well, in no apparent distress.  Vital signs are normal. The abdomen is soft without tenderness, guarding, mass, rebound or organomegaly. No CVA tenderness or inguinal adenopathy noted. Urine dipstick shows positive Leukocytes.  Sent for culture.  Rx for Keflex sent to pharmacy per Dr. Tamera Punt.     Medical screening examination/treatment was performed by qualified clinical staff member and as supervising physician I was immediately available for consultation/collaboration. I have reviewed documentation and agree with assessment and plan.  Colbert Coyer. Tamera Punt, DO

## 2022-11-03 LAB — URINE CULTURE: SPECIMEN QUALITY:: ADEQUATE

## 2022-11-05 LAB — URINE CULTURE: MICRO NUMBER:: 15124818

## 2022-11-09 ENCOUNTER — Other Ambulatory Visit: Payer: Self-pay

## 2022-11-09 ENCOUNTER — Ambulatory Visit: Payer: Medicare Other

## 2022-11-09 DIAGNOSIS — N3001 Acute cystitis with hematuria: Secondary | ICD-10-CM | POA: Diagnosis not present

## 2022-11-09 NOTE — Progress Notes (Signed)
Ordered labs per result note.  °

## 2022-11-10 LAB — URINE CULTURE
MICRO NUMBER:: 15154290
SPECIMEN QUALITY:: ADEQUATE

## 2022-11-21 ENCOUNTER — Other Ambulatory Visit: Payer: Self-pay | Admitting: Family Medicine

## 2022-11-25 DIAGNOSIS — M542 Cervicalgia: Secondary | ICD-10-CM | POA: Diagnosis not present

## 2022-11-25 DIAGNOSIS — Z760 Encounter for issue of repeat prescription: Secondary | ICD-10-CM | POA: Diagnosis not present

## 2023-01-06 ENCOUNTER — Telehealth: Payer: Self-pay | Admitting: Family Medicine

## 2023-01-06 NOTE — Telephone Encounter (Signed)
Pt called in wanting to know if medication could be called in for UTI. She was seen in office in June for same reason, or does she need to schedule another appt. Contact numbers on file are correct.

## 2023-01-06 NOTE — Telephone Encounter (Signed)
Patient scheduled for nurse visit tomorrow 01/07/23=kph

## 2023-01-07 ENCOUNTER — Ambulatory Visit (INDEPENDENT_AMBULATORY_CARE_PROVIDER_SITE_OTHER): Payer: Medicare Other | Admitting: Medical-Surgical

## 2023-01-07 VITALS — BP 143/54 | HR 51 | Ht 60.0 in | Wt 125.0 lb

## 2023-01-07 DIAGNOSIS — N3001 Acute cystitis with hematuria: Secondary | ICD-10-CM

## 2023-01-07 DIAGNOSIS — R300 Dysuria: Secondary | ICD-10-CM

## 2023-01-07 LAB — POCT URINALYSIS DIP (CLINITEK)
Glucose, UA: NEGATIVE mg/dL
Nitrite, UA: NEGATIVE
POC PROTEIN,UA: >=300 — AB
Spec Grav, UA: 1.025 (ref 1.010–1.025)
Urobilinogen, UA: 1 E.U./dL
pH, UA: 6 (ref 5.0–8.0)

## 2023-01-07 MED ORDER — CEPHALEXIN 500 MG PO CAPS
500.0000 mg | ORAL_CAPSULE | Freq: Four times a day (QID) | ORAL | 0 refills | Status: AC
Start: 2023-01-07 — End: 2023-01-12

## 2023-01-07 NOTE — Addendum Note (Signed)
Addended by: Ardyth Man on: 01/07/2023 10:24 AM   Modules accepted: Orders

## 2023-01-07 NOTE — Progress Notes (Signed)
Medical screening examination/treatment was performed by qualified clinical staff member and as supervising provider I was immediately available for consultation/collaboration. I have reviewed documentation and agree with assessment and plan. ° °Joy L. Jessup, DNP, APRN, FNP-BC °[facility] °Primary Care and Sports Medicine ° °

## 2023-01-07 NOTE — Progress Notes (Signed)
Urine dipstick shows negative for all components, positive for leukocytes, protein, ketones. Sent for culture.   Pt states burning urination symptoms started 2 weeks prior. She has not taken any medication for symptoms.   Per Larinda Buttery, rx for Keflex 500mg  QID x 5 days.   Advised patient to increase water intake and try cranberry juice or supplement.

## 2023-01-12 LAB — URINE CULTURE

## 2023-01-13 DIAGNOSIS — Z79899 Other long term (current) drug therapy: Secondary | ICD-10-CM | POA: Diagnosis not present

## 2023-01-13 DIAGNOSIS — Z760 Encounter for issue of repeat prescription: Secondary | ICD-10-CM | POA: Diagnosis not present

## 2023-02-15 ENCOUNTER — Encounter: Payer: Self-pay | Admitting: Family Medicine

## 2023-02-15 ENCOUNTER — Ambulatory Visit (INDEPENDENT_AMBULATORY_CARE_PROVIDER_SITE_OTHER): Payer: Medicare Other | Admitting: Family Medicine

## 2023-02-15 VITALS — BP 186/62 | HR 49 | Ht 60.0 in | Wt 126.0 lb

## 2023-02-15 DIAGNOSIS — N390 Urinary tract infection, site not specified: Secondary | ICD-10-CM | POA: Diagnosis not present

## 2023-02-15 LAB — POCT URINALYSIS DIP (CLINITEK)
Bilirubin, UA: NEGATIVE
Glucose, UA: NEGATIVE mg/dL
Ketones, POC UA: NEGATIVE mg/dL
Nitrite, UA: NEGATIVE
POC PROTEIN,UA: 100 — AB
Spec Grav, UA: >=1.03 — AB (ref 1.010–1.025)
Urobilinogen, UA: 0.2 U/dL
pH, UA: 6 (ref 5.0–8.0)

## 2023-02-15 MED ORDER — CEFTRIAXONE SODIUM 1 G IJ SOLR
1.0000 g | Freq: Once | INTRAMUSCULAR | Status: AC
Start: 2023-02-15 — End: 2023-02-15
  Administered 2023-02-15: 1 g via INTRAMUSCULAR

## 2023-02-15 MED ORDER — CEFDINIR 300 MG PO CAPS
300.0000 mg | ORAL_CAPSULE | Freq: Two times a day (BID) | ORAL | 0 refills | Status: DC
Start: 2023-02-15 — End: 2024-02-09

## 2023-02-15 NOTE — Assessment & Plan Note (Signed)
Urinalysis and symptoms are consistent with urinary tract infection.  Given injection of Rocephin 1 g today and she will start cefdinir tomorrow.  Urine sent for culture.  Referral placed to urology given recurrent nature of her UTIs.

## 2023-02-15 NOTE — Progress Notes (Signed)
Cynthia Adams - 77 y.o. female MRN [id number]  Date of birth: November 19, 1945  Subjective Chief Complaint  Patient presents with   Urinary Tract Infection    HPI Cynthia Adams is a 77 y.o. female here today with complaint of dysuria, frequency and urgency.  She was treated for UTI in August with cephalexin but reports that this never fully resolved.  She has not had fever, chills or flank pain.  She denies nausea.  She is drinking plenty of fluids.   ROS:  A comprehensive ROS was completed and negative except as noted per HPI  Allergies  Allergen Reactions   Amoxicillin    Lovaza [Omega-3-Acid Ethyl Esters] Diarrhea   Macrobid [Nitrofurantoin]    Minocycline Diarrhea, Nausea And Vomiting and Other (See Comments)    Stomach pain     Pantoprazole Swelling   Sulfa Antibiotics Swelling   Amoxicillin-Pot Clavulanate Diarrhea   Lipitor [Atorvastatin] Other (See Comments)    Diarrhea and insomnia   Pravastatin Diarrhea   Rosuvastatin Swelling    Past Medical History:  Diagnosis Date   Cataract 2017   Cataract surgery in 01-2016 and 02-2016   GERD (gastroesophageal reflux disease)    History of iron deficiency 06/13/2014   Hyperlipidemia    Hypertension    Nerve pain    Seasonal allergies     Past Surgical History:  Procedure Laterality Date   ABDOMINAL HYSTERECTOMY     BREAST BIOPSY Right    BREAST CYST EXCISION Right    CATARACT EXTRACTION W/ INTRAOCULAR LENS  IMPLANT, BILATERAL Bilateral 01-2016, 02-2016   CATARACT EXTRACTION W/ INTRAOCULAR LENS & ANTERIOR VITRECTOMY Bilateral 01-29-2016, 02-19-2016   Right eye in 01-2016 and Left eye 02-2016   CERVICAL FUSION  02/11/2020   CESAREAN SECTION     CESAREAN SECTION     CHOLECYSTECTOMY     LUMBAR DISC SURGERY     TONSILLECTOMY      Social History   Socioeconomic History   Marital status: Married    Spouse name: John   Number of children: 2   Years of education: 12   Highest education level: 12th grade   Occupational History   Occupation: Print production planner    Comment: retired  Tobacco Use   Smoking status: Never   Smokeless tobacco: Never  Vaping Use   Vaping status: Never Used  Substance and Sexual Activity   Alcohol use: Not Currently    Comment: .   Drug use: Never   Sexual activity: Not Currently  Other Topics Concern   Not on file  Social History Narrative   Lives with her husband. She has two children. She enjoys reading.   Social Determinants of Health   Financial Resource Strain: Low Risk  (02/15/2022)   Overall Financial Resource Strain (CARDIA)    Difficulty of Paying Living Expenses: Not hard at all  Food Insecurity: No Food Insecurity (02/15/2022)   Hunger Vital Sign    Worried About Running Out of Food in the Last Year: Never true    Ran Out of Food in the Last Year: Never true  Transportation Needs: No Transportation Needs (02/15/2022)   PRAPARE - Administrator, Civil Service (Medical): No    Lack of Transportation (Non-Medical): No  Physical Activity: Inactive (02/15/2022)   Exercise Vital Sign    Days of Exercise per Week: 0 days    Minutes of Exercise per Session: 0 min  Stress: No Stress Concern Present (02/15/2022)   [employer]  of Occupational Health - Occupational Stress Questionnaire    Feeling of Stress : Not at all  Social Connections: Socially Integrated (02/15/2022)   Social Connection and Isolation Panel [NHANES]    Frequency of Communication with Friends and Family: Three times a week    Frequency of Social Gatherings with Friends and Family: Twice a week    Attends Religious Services: More than 4 times per year    Active Member of Clubs or Organizations: Yes    Attends Engineer, structural: More than 4 times per year    Marital Status: Married    Family History  Problem Relation Age of Onset   Cancer Mother        breast   Dementia Mother    Hypertension Mother    Hyperlipidemia Mother    Breast cancer Mother     Dementia Sister    Hypertension Sister    Hyperlipidemia Sister    Hyperlipidemia Brother    Hypertension Brother    Hypertension Son    Dementia Maternal Aunt    Breast cancer Maternal Aunt    Cancer Paternal Uncle    Alcohol abuse Paternal Uncle    Stroke Maternal Grandmother    Heart disease Paternal Grandmother    Hyperlipidemia Sister    Hypertension Sister    Hyperlipidemia Sister    Hypertension Sister    Dementia Maternal Aunt    Breast cancer Maternal Aunt    Dementia Maternal Aunt    Breast cancer Maternal Aunt    Alcohol abuse Paternal Uncle     Health Maintenance  Topic Date Due   INFLUENZA VACCINE  12/09/2022   COVID-19 Vaccine (6 - 2023-24 season) 01/09/2023   Medicare Annual Wellness (AWV)  02/16/2023   DTaP/Tdap/Td (3 - Td or Tdap) 08/15/2024   Pneumonia Vaccine 28+ Years old  Completed   DEXA SCAN  Completed   Hepatitis C Screening  Completed   Zoster Vaccines- Shingrix  Completed   HPV VACCINES  Aged Out   Colonoscopy  Discontinued     ----------------------------------------------------------------------------------------------------------------------------------------------------------------------------------------------------------------- Physical Exam BP (!) 186/62 (BP Location: Left Arm, Patient Position: Sitting, Cuff Size: Small)   Pulse (!) 49   Ht 5' (1.524 m)   Wt 126 lb (57.2 kg)   SpO2 99%   BMI 24.61 kg/m   Physical Exam Constitutional:      Appearance: Normal appearance.  HENT:     Head: Normocephalic and atraumatic.  Eyes:     General: No scleral icterus. Cardiovascular:     Rate and Rhythm: Normal rate and regular rhythm.  Pulmonary:     Effort: Pulmonary effort is normal.     Breath sounds: Normal breath sounds.  Abdominal:     Tenderness: There is no right CVA tenderness or left CVA tenderness.  Neurological:     Mental Status: She is alert.  Psychiatric:        Mood and Affect: Mood normal.        Behavior:  Behavior normal.     ------------------------------------------------------------------------------------------------------------------------------------------------------------------------------------------------------------------- Assessment and Plan  Recurrent UTI Urinalysis and symptoms are consistent with urinary tract infection.  Given injection of Rocephin 1 g today and she will start cefdinir tomorrow.  Urine sent for culture.  Referral placed to urology given recurrent nature of her UTIs.   Meds ordered this encounter  Medications   cefdinir (OMNICEF) 300 MG capsule    Sig: Take 1 capsule (300 mg total) by mouth 2 (two) times daily.  Dispense:  14 capsule    Refill:  0   cefTRIAXone (ROCEPHIN) injection 1 g    No follow-ups on file.    This visit occurred during the SARS-CoV-2 public health emergency.  Safety protocols were in place, including screening questions prior to the visit, additional usage of staff PPE, and extensive cleaning of exam room while observing appropriate contact time as indicated for disinfecting solutions.

## 2023-02-18 NOTE — Progress Notes (Signed)
Please let patient know that antibiotics prescribed should resolve current infection.  Let me know if symptoms are not improving.   CM

## 2023-02-19 LAB — URINE CULTURE

## 2023-02-23 ENCOUNTER — Other Ambulatory Visit: Payer: Self-pay | Admitting: Family Medicine

## 2023-02-23 MED ORDER — FOSFOMYCIN TROMETHAMINE 3 G PO PACK: 3.0000 g | PACK | Freq: Once | ORAL | 0 refills | Status: AC

## 2023-02-24 DIAGNOSIS — M542 Cervicalgia: Secondary | ICD-10-CM | POA: Diagnosis not present

## 2023-02-24 DIAGNOSIS — Z760 Encounter for issue of repeat prescription: Secondary | ICD-10-CM | POA: Diagnosis not present

## 2023-03-02 ENCOUNTER — Encounter: Payer: Self-pay | Admitting: Urology

## 2023-03-02 ENCOUNTER — Ambulatory Visit: Payer: Medicare Other | Admitting: Urology

## 2023-03-02 VITALS — BP 158/70 | HR 61 | Ht 60.0 in | Wt 125.0 lb

## 2023-03-02 DIAGNOSIS — N952 Postmenopausal atrophic vaginitis: Secondary | ICD-10-CM

## 2023-03-02 DIAGNOSIS — N3020 Other chronic cystitis without hematuria: Secondary | ICD-10-CM | POA: Diagnosis not present

## 2023-03-02 DIAGNOSIS — Z87440 Personal history of urinary (tract) infections: Secondary | ICD-10-CM

## 2023-03-02 MED ORDER — ESTRADIOL 0.1 MG/GM VA CREA
TOPICAL_CREAM | VAGINAL | 3 refills | Status: AC
Start: 2023-03-02 — End: ?

## 2023-03-02 NOTE — Progress Notes (Signed)
H&P  Chief Complaint: [name] tract infections  History of Present Illness: Cynthia Adams is a 77 y.o. year old female presenting for recurrent urinary tract infections.  Patient has had 2 urinary tract infections over the past 2 months-in late August, E. coli and in early October, E. coli/enterococcal.  Denies any history of febrile urinary tract infections.  Typical symptoms include frequency urgency and dysuria.  Not currently on perivaginal estrogen.  Denies current symptoms associated with UTIs.  Past Medical History:  Diagnosis Date   Cataract 2017   Cataract surgery in 01-2016 and 02-2016   GERD (gastroesophageal reflux disease)    History of iron deficiency 06/13/2014   Hyperlipidemia    Hypertension    Nerve pain    Seasonal allergies     Past Surgical History:  Procedure Laterality Date   ABDOMINAL HYSTERECTOMY     BREAST BIOPSY Right    BREAST CYST EXCISION Right    CATARACT EXTRACTION W/ INTRAOCULAR LENS  IMPLANT, BILATERAL Bilateral 01-2016, 02-2016   CATARACT EXTRACTION W/ INTRAOCULAR LENS & ANTERIOR VITRECTOMY Bilateral 01-29-2016, 02-19-2016   Right eye in 01-2016 and Left eye 02-2016   CERVICAL FUSION  02/11/2020   CESAREAN SECTION     CESAREAN SECTION     CHOLECYSTECTOMY     LUMBAR DISC SURGERY     TONSILLECTOMY      Home Medications:  (Not in a hospital admission)   Allergies:  Allergies  Allergen Reactions   Amoxicillin    Lovaza [Omega-3-Acid Ethyl Esters] Diarrhea   Macrobid [Nitrofurantoin]    Minocycline Diarrhea, Nausea And Vomiting and Other (See Comments)    Stomach pain     Pantoprazole Swelling   Sulfa Antibiotics Swelling   Amoxicillin-Pot Clavulanate Diarrhea   Lipitor [Atorvastatin] Other (See Comments)    Diarrhea and insomnia   Pravastatin Diarrhea   Rosuvastatin Swelling    Family History  Problem Relation Age of Onset   Cancer Mother        breast   Dementia Mother    Hypertension Mother    Hyperlipidemia Mother    Breast  cancer Mother    Dementia Sister    Hypertension Sister    Hyperlipidemia Sister    Hyperlipidemia Brother    Hypertension Brother    Hypertension Son    Dementia Maternal Aunt    Breast cancer Maternal Aunt    Cancer Paternal Uncle    Alcohol abuse Paternal Uncle    Stroke Maternal Grandmother    Heart disease Paternal Grandmother    Hyperlipidemia Sister    Hypertension Sister    Hyperlipidemia Sister    Hypertension Sister    Dementia Maternal Aunt    Breast cancer Maternal Aunt    Dementia Maternal Aunt    Breast cancer Maternal Aunt    Alcohol abuse Paternal Uncle     Social History:  reports that she has never smoked. She has never used smokeless tobacco. She reports that she does not currently use alcohol. She reports that she does not use drugs.  ROS: A complete review of systems was performed.  All systems are negative except for pertinent findings as noted.  Physical Exam:  Vital signs in last 24 hours: @VSRANGES @ General:  Alert and oriented, No acute distress HEENT: Normocephalic, atraumatic Neck: No JVD or lymphadenopathy Cardiovascular: Regular rate  Lungs: Normal inspiratory/expiratory excursion Extremities: No edema Neurologic: Grossly intact  I have reviewed prior pt notes  I have reviewed notes from referring/previous physicians  I  have reviewed urinalysis results  I have reviewed prior urine cultures  Prior blood chemistry/CBCs reviewed  Impression/Assessment:  1.  Recurrent UTIs-currently, no evidence of infection  2.  Vaginal atrophic changes  Plan:  1.  I spoke with the patient regarding the difference between symptomatic UTIs and pyuria-treat only symptomatic UTIs  2.  Start on perivaginal estrogen cream  3.  I will see back in 3 months  Chelsea Aus 03/02/2023, 7:47 AM  Bertram Millard. Sallyanne Birkhead MD

## 2023-03-03 LAB — MICROSCOPIC EXAMINATION

## 2023-03-03 LAB — URINALYSIS, ROUTINE W REFLEX MICROSCOPIC
Bilirubin, UA: NEGATIVE
Glucose, UA: NEGATIVE
Ketones, UA: NEGATIVE
Nitrite, UA: NEGATIVE
Protein,UA: NEGATIVE
RBC, UA: NEGATIVE
Specific Gravity, UA: 1.01 (ref 1.005–1.030)
Urobilinogen, Ur: 0.2 mg/dL (ref 0.2–1.0)
pH, UA: 6 (ref 5.0–7.5)

## 2023-03-23 ENCOUNTER — Ambulatory Visit: Payer: Medicare Other | Admitting: Family Medicine

## 2023-03-23 DIAGNOSIS — Z0000 Encounter for general adult medical examination without abnormal findings: Secondary | ICD-10-CM | POA: Diagnosis not present

## 2023-03-23 NOTE — Patient Instructions (Addendum)
MEDICARE ANNUAL WELLNESS VISIT Health Maintenance Summary and Written Plan of Care  [name] ,  Thank you for allowing me to perform your Medicare Annual Wellness Visit and for your ongoing commitment to your health.   Health Maintenance & Immunization History Health Maintenance  Topic Date Due   COVID-19 Vaccine (6 - 2023-24 season) 04/08/2023 (Originally 01/09/2023)   DEXA SCAN  04/13/2023   Medicare Annual Wellness (AWV)  03/22/2024   DTaP/Tdap/Td (3 - Td or Tdap) 08/15/2024   Pneumonia Vaccine 42+ Years old  Completed   INFLUENZA VACCINE  Completed   Hepatitis C Screening  Completed   Zoster Vaccines- Shingrix  Completed   HPV VACCINES  Aged Out   Colonoscopy  Discontinued   Immunization History  Administered Date(s) Administered   Fluad Quad(high Dose 65+) 12/26/2018, 03/05/2020, 02/18/2021, 02/09/2022   Fluzone Influenza virus vaccine,trivalent (IIV3), split virus 02/04/2012   Influenza, High Dose Seasonal PF 02/26/2016, 01/25/2018   Influenza-Unspecified 02/04/2012, 01/09/2015, 02/21/2017, 03/04/2023   Moderna Covid-19 Fall Seasonal Vaccine 66yrs & older 02/26/2022   PFIZER(Purple Top)SARS-COV-2 Vaccination 07/09/2019, 08/07/2019, 04/18/2020, 12/23/2020   Pneumococcal Conjugate-13 02/26/2016   Pneumococcal Polysaccharide-23 05/08/2006, 05/25/2017   Rsv, Bivalent, Protein Subunit Rsvpref,pf Verdis Frederickson) 03/04/2023   Td 05/10/2004   Tdap 08/16/2014   Zoster Recombinant(Shingrix) 07/28/2017, 10/08/2017   Zoster, Live 03/17/2015    These are the patient goals that we discussed:  Goals Addressed               This Visit's Progress     Patient Stated (pt-stated)        03/23/2023 AWV Goal: Exercise for General Health  Patient will verbalize understanding of the benefits of increased physical activity: Exercising regularly is important. It will improve your overall fitness, flexibility, and endurance. Regular exercise also will improve your overall health. It can  help you control your weight, reduce stress, and improve your bone density. Over the next year, patient will increase physical activity as tolerated with a goal of at least 150 minutes of moderate physical activity per week.  You can tell that you are exercising at a moderate intensity if your heart starts beating faster and you start breathing faster but can still hold a conversation. Moderate-intensity exercise ideas include: Walking 1 mile (1.6 km) in about 15 minutes Biking Hiking Golfing Dancing Water aerobics Patient will verbalize understanding of everyday activities that increase physical activity by providing examples like the following: Yard work, such as: Insurance underwriter Gardening Washing windows or floors Patient will be able to explain general safety guidelines for exercising:  Before you start a new exercise program, talk with your health care provider. Do not exercise so much that you hurt yourself, feel dizzy, or get very short of breath. Wear comfortable clothes and wear shoes with good support. Drink plenty of water while you exercise to prevent dehydration or heat stroke. Work out until your breathing and your heartbeat get faster.          This is a list of Health Maintenance Items that are overdue or due now: Bone densitometry screening - discuss with PCP regarding next DEXA scan Mammogram - Patient states that she schedules it herself.   Orders/Referrals Placed Today: No orders of the defined types were placed in this encounter.  (Contact our referral department at [phone] if you have not spoken with someone about your referral appointment within the next 5 days)  Follow-up Plan Follow-up with Everrett Coombe, DO as planned Medicare wellness visit in one year.  AVS printed and mailed to the patient.       Health Maintenance, Female Adopting a healthy lifestyle  and getting preventive care are important in promoting health and wellness. Ask your health care provider about: The right schedule for you to have regular tests and exams. Things you can do on your own to prevent diseases and keep yourself healthy. What should I know about diet, weight, and exercise? Eat a healthy diet  Eat a diet that includes plenty of vegetables, fruits, low-fat dairy products, and lean protein. Do not eat a lot of foods that are high in solid fats, added sugars, or sodium. Maintain a healthy weight Body mass index (BMI) is used to identify weight problems. It estimates body fat based on height and weight. Your health care provider can help determine your BMI and help you achieve or maintain a healthy weight. Get regular exercise Get regular exercise. This is one of the most important things you can do for your health. Most adults should: Exercise for at least 150 minutes each week. The exercise should increase your heart rate and make you sweat (moderate-intensity exercise). Do strengthening exercises at least twice a week. This is in addition to the moderate-intensity exercise. Spend less time sitting. Even light physical activity can be beneficial. Watch cholesterol and blood lipids Have your blood tested for lipids and cholesterol at 77 years of age, then have this test every 5 years. Have your cholesterol levels checked more often if: Your lipid or cholesterol levels are high. You are older than 77 years of age. You are at high risk for heart disease. What should I know about cancer screening? Depending on your health history and family history, you may need to have cancer screening at various ages. This may include screening for: Breast cancer. Cervical cancer. Colorectal cancer. Skin cancer. Lung cancer. What should I know about heart disease, diabetes, and high blood pressure? Blood pressure and heart disease High blood pressure causes heart disease and  increases the risk of stroke. This is more likely to develop in people who have high blood pressure readings or are overweight. Have your blood pressure checked: Every 3-5 years if you are 58-50 years of age. Every year if you are 50 years old or older. Diabetes Have regular diabetes screenings. This checks your fasting blood sugar level. Have the screening done: Once every three years after age 49 if you are at a normal weight and have a low risk for diabetes. More often and at a younger age if you are overweight or have a high risk for diabetes. What should I know about preventing infection? Hepatitis B If you have a higher risk for hepatitis B, you should be screened for this virus. Talk with your health care provider to find out if you are at risk for hepatitis B infection. Hepatitis C Testing is recommended for: Everyone born from [birth year] through 1965. Anyone with known risk factors for hepatitis C. Sexually transmitted infections (STIs) Get screened for STIs, including gonorrhea and chlamydia, if: You are sexually active and are younger than 77 years of age. You are older than 77 years of age and your health care provider tells you that you are at risk for this type of infection. Your sexual activity has changed since you were last screened, and you are at increased risk for chlamydia or gonorrhea. Ask your health care provider if you  are at risk. Ask your health care provider about whether you are at high risk for HIV. Your health care provider may recommend a prescription medicine to help prevent HIV infection. If you choose to take medicine to prevent HIV, you should first get tested for HIV. You should then be tested every 3 months for as long as you are taking the medicine. Pregnancy If you are about to stop having your period (premenopausal) and you may become pregnant, seek counseling before you get pregnant. Take 400 to 800 micrograms (mcg) of folic acid every day if you become  pregnant. Ask for birth control (contraception) if you want to prevent pregnancy. Osteoporosis and menopause Osteoporosis is a disease in which the bones lose minerals and strength with aging. This can result in bone fractures. If you are 27 years old or older, or if you are at risk for osteoporosis and fractures, ask your health care provider if you should: Be screened for bone loss. Take a calcium or vitamin D supplement to lower your risk of fractures. Be given hormone replacement therapy (HRT) to treat symptoms of menopause. Follow these instructions at home: Alcohol use Do not drink alcohol if: Your health care provider tells you not to drink. You are pregnant, may be pregnant, or are planning to become pregnant. If you drink alcohol: Limit how much you have to: 0-1 drink a day. Know how much alcohol is in your drink. In the [place] one drink equals one 12 oz bottle of beer (355 mL), one 5 oz glass of wine (148 mL), or one 1 oz glass of hard liquor (44 mL). Lifestyle Do not use any products that contain nicotine or tobacco. These products include cigarettes, chewing tobacco, and vaping devices, such as e-cigarettes. If you need help quitting, ask your health care provider. Do not use street drugs. Do not share needles. Ask your health care provider for help if you need support or information about quitting drugs. General instructions Schedule regular health, dental, and eye exams. Stay current with your vaccines. Tell your health care provider if: You often feel depressed. You have ever been abused or do not feel safe at home. Summary Adopting a healthy lifestyle and getting preventive care are important in promoting health and wellness. Follow your health care provider's instructions about healthy diet, exercising, and getting tested or screened for diseases. Follow your health care provider's instructions on monitoring your cholesterol and blood pressure. This information is not  intended to replace advice given to you by your health care provider. Make sure you discuss any questions you have with your health care provider. Document Revised: 09/15/2020 Document Reviewed: 09/15/2020 Elsevier Patient Education  2024 [employer].  Thank you for enrolling in [facility]. Please follow the instructions below to securely access your online medical record. MyChart allows you to send messages to your doctor, view your test results, manage appointments, and more.   How Do I Sign Up? In your Internet browser, go to [employer] and enter [email or web address]. Click on the Sign Up Now link in the Sign In box. You will see the New Member Sign Up page. Enter your MyChart Access Code exactly as it appears below. You will not need to use this code after you've completed the sign-up process. If you do not sign up before the expiration date, you must request a new code.  MyChart Access Code: [email or web address] Expires: 04/16/2023 12:56 PM  Enter your Social Security Number ([id number]) and Date of Birth (mm/dd/yyyy) as indicated  and click Submit. You will be taken to the next sign-up page. Create a MyChart ID. This will be your MyChart login ID and cannot be changed, so think of one that is secure and easy to remember. Create a Clinical biochemist. You can change your password at any time. Enter your Password Reset Question and Answer. This can be used at a later time if you forget your password.  Enter your e-mail address. You will receive e-mail notification when new information is available in MyChart. Click Sign Up. You can now view your medical record.   Additional Information Remember, MyChart is NOT to be used for urgent needs. For medical emergencies, dial 911.

## 2023-03-23 NOTE — Progress Notes (Signed)
MEDICARE ANNUAL WELLNESS VISIT  03/23/2023  Telephone Visit Disclaimer This Medicare AWV was conducted by telephone due to national recommendations for restrictions regarding the COVID-19 Pandemic (e.g. social distancing).  I verified, using two identifiers, that I am speaking with Cynthia Adams or their authorized healthcare agent. I discussed the limitations, risks, security, and privacy concerns of performing an evaluation and management service by telephone and the potential availability of an in-person appointment in the future. The patient expressed understanding and agreed to proceed.  Location of Patient: Home Location of Provider (nurse):  In the office.  Subjective:    Cynthia Adams is a 77 y.o. female patient of Everrett Coombe, DO who had a Medicare Annual Wellness Visit today via telephone. Cynthia Adams is Retired and lives with their spouse. she has 2 children. she reports that she is socially active and does interact with friends/family regularly. she is moderately physically active and enjoys reading.  Patient Care Team: Everrett Coombe, DO as PCP - General (Family Medicine) Chrystie Nose, MD as PCP - Cardiology (Cardiology)     03/23/2023    8:27 AM 02/15/2022    9:33 AM 02/13/2021    9:46 AM 02/14/2020    4:03 PM 08/13/2019    9:37 AM 06/13/2018    9:27 AM 05/28/2016    4:05 PM  Advanced Directives  Does Patient Have a Medical Advance Directive? Yes Yes Yes Yes Yes Yes   Type of Advance Directive Living will Living will Healthcare Power of [place];Living will Healthcare Power of [place];Living will Healthcare Power of [place];Living will Healthcare Power of [place];Living will Healthcare Power of [place];Living will  Does patient want to make changes to medical advance directive? No - Patient declined No - Patient declined No - Patient declined No - Patient declined  No - Patient declined   Copy of Healthcare Power of Attorney in Chart?   No - copy requested No - copy  requested  No - copy requested No - copy requested    Hospital Utilization Over the Past 12 Months: # of hospitalizations or ER visits: 0 # of surgeries: 0  Review of Systems    Patient reports that her overall health is unchanged compared to last year.  History obtained from chart review and the patient  Patient Reported Readings (BP, Pulse, CBG, Weight, etc) none Per patient no change in vitals since last visit, unable to obtain new vitals due to telehealth visit  Pain Assessment Pain : No/denies pain     Current Medications & Allergies (verified) Allergies as of 03/23/2023       Reactions   Amoxicillin    Lovaza [omega-3-acid Ethyl Esters] Diarrhea   Macrobid [nitrofurantoin]    Minocycline Diarrhea, Nausea And Vomiting, Other (See Comments)   Stomach pain   Pantoprazole Swelling   Sulfa Antibiotics Swelling   Amoxicillin-pot Clavulanate Diarrhea   Lipitor [atorvastatin] Other (See Comments)   Diarrhea and insomnia   Pravastatin Diarrhea   Rosuvastatin Swelling        Medication List        Accurate as of March 23, 2023  8:36 AM. If you have any questions, ask your nurse or doctor.          aspirin 81 MG tablet Take 81 mg by mouth daily.   B-12 PO Take 1 mL by mouth daily.   BENEFIBER DRINK MIX PO Take by mouth daily. Pt takes 2 teaspoons in the morning.   cefdinir 300 MG capsule Commonly  known as: OMNICEF Take 1 capsule (300 mg total) by mouth 2 (two) times daily.   estradiol 0.1 MG/GM vaginal cream Commonly known as: ESTRACE Apply a 1 cm line of cream to your vaginal opening using your finger 2 nights a week   estradiol 0.5 MG tablet Commonly known as: ESTRACE TAKE ONE-HALF TABLET BY MOUTH  DAILY   fluticasone 50 MCG/ACT nasal spray Commonly known as: FLONASE USE 2 SPRAYS IN EACH  NOSTRIL DAILY   gabapentin 600 MG tablet Commonly known as: NEURONTIN TAKE 1 TABLET BY MOUTH IN THE  MORNING , 1 TABLET BY MOUTH AT  LUNCH, AND 2 TABLETS  BY MOUTH AT BEDTIME   gemfibrozil 600 MG tablet Commonly known as: LOPID TAKE 1 TABLET BY MOUTH TWICE  DAILY BEFORE MEALS   HYDROcodone-acetaminophen 10-325 MG tablet Commonly known as: NORCO Take by mouth.   metoprolol tartrate 50 MG tablet Commonly known as: LOPRESSOR TAKE 1 TABLET BY MOUTH TWICE  DAILY   multivitamin with minerals tablet Take 1 tablet by mouth daily.   omeprazole 40 MG capsule Commonly known as: PRILOSEC Take 40 mg by mouth daily.        History (reviewed): Past Medical History:  Diagnosis Date   Cataract 2017   Cataract surgery in 01-2016 and 02-2016   GERD (gastroesophageal reflux disease)    History of iron deficiency 06/13/2014   Hyperlipidemia    Hypertension    Nerve pain    Seasonal allergies    Past Surgical History:  Procedure Laterality Date   ABDOMINAL HYSTERECTOMY     BREAST BIOPSY Right    BREAST CYST EXCISION Right    CATARACT EXTRACTION W/ INTRAOCULAR LENS  IMPLANT, BILATERAL Bilateral 01-2016, 02-2016   CATARACT EXTRACTION W/ INTRAOCULAR LENS & ANTERIOR VITRECTOMY Bilateral 01-29-2016, 02-19-2016   Right eye in 01-2016 and Left eye 02-2016   CERVICAL FUSION  02/11/2020   CESAREAN SECTION     CESAREAN SECTION     CHOLECYSTECTOMY     LUMBAR DISC SURGERY     TONSILLECTOMY     Family History  Problem Relation Age of Onset   Cancer Mother        breast   Dementia Mother    Hypertension Mother    Hyperlipidemia Mother    Breast cancer Mother    Dementia Sister    Hypertension Sister    Hyperlipidemia Sister    Hyperlipidemia Brother    Hypertension Brother    Hypertension Son    Dementia Maternal Aunt    Breast cancer Maternal Aunt    Cancer Paternal Uncle    Alcohol abuse Paternal Uncle    Stroke Maternal Grandmother    Heart disease Paternal Grandmother    Hyperlipidemia Sister    Hypertension Sister    Hyperlipidemia Sister    Hypertension Sister    Dementia Maternal Aunt    Breast cancer Maternal Aunt     Dementia Maternal Aunt    Breast cancer Maternal Aunt    Alcohol abuse Paternal Uncle    Social History   Socioeconomic History   Marital status: Married    Spouse name: John   Number of children: 2   Years of education: 12   Highest education level: 12th grade  Occupational History   Occupation: Print production planner    Comment: retired  Tobacco Use   Smoking status: Never   Smokeless tobacco: Never  Vaping Use   Vaping status: Never Used  Substance and Sexual Activity   Alcohol  use: Not Currently    Comment: .   Drug use: Never   Sexual activity: Not Currently  Other Topics Concern   Not on file  Social History Narrative   Lives with her husband. She has two children. She enjoys reading.   Social Determinants of Health   Financial Resource Strain: Low Risk  (03/23/2023)   Overall Financial Resource Strain (CARDIA)    Difficulty of Paying Living Expenses: Not hard at all  Food Insecurity: No Food Insecurity (03/23/2023)   Hunger Vital Sign    Worried About Running Out of Food in the Last Year: Never true    Ran Out of Food in the Last Year: Never true  Transportation Needs: No Transportation Needs (03/23/2023)   PRAPARE - Administrator, Civil Service (Medical): No    Lack of Transportation (Non-Medical): No  Physical Activity: Inactive (03/23/2023)   Exercise Vital Sign    Days of Exercise per Week: 0 days    Minutes of Exercise per Session: 0 min  Stress: No Stress Concern Present (03/23/2023)   [employer] of Occupational Health - Occupational Stress Questionnaire    Feeling of Stress : Not at all  Social Connections: Moderately Integrated (03/23/2023)   Social Connection and Isolation Panel [NHANES]    Frequency of Communication with Friends and Family: Twice a week    Frequency of Social Gatherings with Friends and Family: Twice a week    Attends Religious Services: More than 4 times per year    Active Member of [employer] or Organizations: No     Attends Banker Meetings: Never    Marital Status: Married    Activities of Daily Living    03/23/2023    8:25 AM  In your present state of health, do you have any difficulty performing the following activities:  Hearing? 0  Vision? 0  Difficulty concentrating or making decisions? 1  Comment some memory issues  Walking or climbing stairs? 0  Dressing or bathing? 0  Doing errands, shopping? 0  Preparing Food and eating ? N  Using the Toilet? N  In the past six months, have you accidently leaked urine? N  Do you have problems with loss of bowel control? N  Managing your Medications? N  Managing your Finances? N  Housekeeping or managing your Housekeeping? N    Patient Education/ Literacy How often do you need to have someone help you when you read instructions, pamphlets, or other written materials from your doctor or pharmacy?: 1 - Never What is the last grade level you completed in school?: 12th grade  Exercise    Diet Patient reports consuming 3 meals a day and 0-1 snack(s) a day Patient reports that her primary diet is: Regular Patient reports that she does have regular access to food.   Depression Screen    03/23/2023    8:25 AM 06/21/2022   11:33 AM 02/15/2022    9:34 AM 11/09/2021   11:45 AM 02/13/2021    9:50 AM 07/31/2019   10:16 AM 06/13/2018    9:28 AM  PHQ 2/9 Scores  PHQ - 2 Score 0 0 1 0 0 2 0  PHQ- 9 Score      7      Fall Risk    03/23/2023    8:24 AM 06/21/2022   11:33 AM 02/15/2022    9:34 AM 11/09/2021   11:45 AM 04/16/2021    9:24 AM  Fall Risk   Falls in  the past year? 0 0 0 0 0  Number falls in past yr: 0 0 0 0 0  Injury with Fall? 0 0 0 0 0  Risk for fall due to : No Fall Risks No Fall Risks No Fall Risks No Fall Risks No Fall Risks  Follow up Falls evaluation completed Falls evaluation completed Falls evaluation completed Falls evaluation completed Falls evaluation completed     Objective:  Cynthia Adams seemed alert and  oriented and she participated appropriately during our telephone visit.  Blood Pressure Weight BMI  BP Readings from Last 3 Encounters:  03/02/23 (!) 158/70  02/15/23 (!) 186/62  01/07/23 (!) 143/54   Wt Readings from Last 3 Encounters:  03/02/23 125 lb (56.7 kg)  02/15/23 126 lb (57.2 kg)  01/07/23 125 lb (56.7 kg)   BMI Readings from Last 1 Encounters:  03/02/23 24.41 kg/m    *Unable to obtain current vital signs, weight, and BMI due to telephone visit type  Hearing/Vision  Cynthia Adams did not seem to have difficulty with hearing/understanding during the telephone conversation Reports that she has had a formal eye exam by an eye care professional within the past year Reports that she has not had a formal hearing evaluation within the past year *Unable to fully assess hearing and vision during telephone visit type  Cognitive Function:    03/23/2023    8:29 AM 02/15/2022    9:39 AM 02/13/2021    9:53 AM 06/13/2018    9:32 AM 05/28/2016    4:28 PM  6CIT Screen  What Year? 0 points 0 points 0 points 0 points 0 points  What month? 0 points 0 points 0 points 0 points 0 points  What time? 0 points 0 points 0 points 0 points 0 points  Count back from 20 0 points 0 points 0 points 0 points 0 points  Months in reverse 0 points 0 points 0 points 0 points 0 points  Repeat phrase 0 points 0 points 0 points 0 points 0 points  Total Score 0 points 0 points 0 points 0 points 0 points   (Normal:0-7, Significant for Dysfunction: >8)  Normal Cognitive Function Screening: Yes   Immunization & Health Maintenance Record Immunization History  Administered Date(s) Administered   Fluad Quad(high Dose 65+) 12/26/2018, 03/05/2020, 02/18/2021, 02/09/2022   Fluzone Influenza virus vaccine,trivalent (IIV3), split virus 02/04/2012   Influenza, High Dose Seasonal PF 02/26/2016, 01/25/2018   Influenza-Unspecified 02/04/2012, 01/09/2015, 02/21/2017, 03/04/2023   Moderna Covid-19 Fall Seasonal Vaccine 90yrs &  older 02/26/2022   PFIZER(Purple Top)SARS-COV-2 Vaccination 07/09/2019, 08/07/2019, 04/18/2020, 12/23/2020   Pneumococcal Conjugate-13 02/26/2016   Pneumococcal Polysaccharide-23 05/08/2006, 05/25/2017   Rsv, Bivalent, Protein Subunit Rsvpref,pf (Abrysvo) 03/04/2023   Td 05/10/2004   Tdap 08/16/2014   Zoster Recombinant(Shingrix) 07/28/2017, 10/08/2017   Zoster, Live 03/17/2015    Health Maintenance  Topic Date Due   COVID-19 Vaccine (6 - 2023-24 season) 04/08/2023 (Originally 01/09/2023)   DEXA SCAN  04/13/2023   Medicare Annual Wellness (AWV)  03/22/2024   DTaP/Tdap/Td (3 - Td or Tdap) 08/15/2024   Pneumonia Vaccine 26+ Years old  Completed   INFLUENZA VACCINE  Completed   Hepatitis C Screening  Completed   Zoster Vaccines- Shingrix  Completed   HPV VACCINES  Aged Out   Colonoscopy  Discontinued       Assessment  This is a routine wellness examination for [employer].  Health Maintenance: Due or Overdue There are no preventive care reminders to display for  this patient.   Cynthia Adams does not need a referral for Community Assistance: Care Management:   no Social Work:    no Prescription Assistance:  no Nutrition/Diabetes Education:  no   Plan:  Personalized Goals  Goals Addressed               This Visit's Progress     Patient Stated (pt-stated)        03/23/2023 AWV Goal: Exercise for General Health  Patient will verbalize understanding of the benefits of increased physical activity: Exercising regularly is important. It will improve your overall fitness, flexibility, and endurance. Regular exercise also will improve your overall health. It can help you control your weight, reduce stress, and improve your bone density. Over the next year, patient will increase physical activity as tolerated with a goal of at least 150 minutes of moderate physical activity per week.  You can tell that you are exercising at a moderate intensity if your heart starts  beating faster and you start breathing faster but can still hold a conversation. Moderate-intensity exercise ideas include: Walking 1 mile (1.6 km) in about 15 minutes Biking Hiking Golfing Dancing Water aerobics Patient will verbalize understanding of everyday activities that increase physical activity by providing examples like the following: Yard work, such as: Insurance underwriter Gardening Washing windows or floors Patient will be able to explain general safety guidelines for exercising:  Before you start a new exercise program, talk with your health care provider. Do not exercise so much that you hurt yourself, feel dizzy, or get very short of breath. Wear comfortable clothes and wear shoes with good support. Drink plenty of water while you exercise to prevent dehydration or heat stroke. Work out until your breathing and your heartbeat get faster.        Personalized Health Maintenance & Screening Recommendations  Bone densitometry screening - discuss with PCP regarding next DEXA scan Mammogram - Patient states that she schedules it herself.  Lung Cancer Screening Recommended: no (Low Dose CT Chest recommended if Age 90-80 years, 20 pack-year currently smoking OR have quit w/in past 15 years) Hepatitis C Screening recommended: no HIV Screening recommended: no  Advanced Directives: Written information was not prepared per patient's request.  Referrals & Orders No orders of the defined types were placed in this encounter.   Follow-up Plan Follow-up with Everrett Coombe, DO as planned Medicare wellness visit in one year.  AVS printed and mailed to the patient.    I have personally reviewed and noted the following in the patient's chart:   Medical and social history Use of alcohol, tobacco or illicit drugs  Current medications and supplements Functional ability and status Nutritional  status Physical activity Advanced directives List of other physicians Hospitalizations, surgeries, and ER visits in previous 12 months Vitals Screenings to include cognitive, depression, and falls Referrals and appointments  In addition, I have reviewed and discussed with Cynthia Adams certain preventive protocols, quality metrics, and best practice recommendations. A written personalized care plan for preventive services as well as general preventive health recommendations is available and can be mailed to the patient at her request.      Modesto Charon, RN BSN  03/23/2023

## 2023-03-24 DIAGNOSIS — M542 Cervicalgia: Secondary | ICD-10-CM | POA: Diagnosis not present

## 2023-03-24 DIAGNOSIS — Z760 Encounter for issue of repeat prescription: Secondary | ICD-10-CM | POA: Diagnosis not present

## 2023-04-13 ENCOUNTER — Telehealth: Payer: Self-pay

## 2023-04-13 NOTE — Telephone Encounter (Signed)
-----   Message from [place] sent at 03/02/2023  2:58 PM EDT ----- Regarding: Return in about 3 months (around 06/02/2023). Return in about 3 months (around 06/02/2023).  Need to call and schedule in December for January

## 2023-04-13 NOTE — Telephone Encounter (Signed)
Attempted to call pt, there was no answer at the preferred number.

## 2023-04-14 NOTE — Telephone Encounter (Signed)
Attempted home and cell numbers; no answer at [facility].

## 2023-04-15 NOTE — Telephone Encounter (Signed)
Was able to leave msg on cell number to call and schedule appt in January

## 2023-04-15 NOTE — Telephone Encounter (Signed)
 Pt called back and was scheduled for an appt.

## 2023-04-21 DIAGNOSIS — Z760 Encounter for issue of repeat prescription: Secondary | ICD-10-CM | POA: Diagnosis not present

## 2023-04-21 DIAGNOSIS — Z79899 Other long term (current) drug therapy: Secondary | ICD-10-CM | POA: Diagnosis not present

## 2023-04-21 DIAGNOSIS — G894 Chronic pain syndrome: Secondary | ICD-10-CM | POA: Diagnosis not present

## 2023-05-19 DIAGNOSIS — Z79899 Other long term (current) drug therapy: Secondary | ICD-10-CM | POA: Diagnosis not present

## 2023-05-19 DIAGNOSIS — G894 Chronic pain syndrome: Secondary | ICD-10-CM | POA: Diagnosis not present

## 2023-05-19 DIAGNOSIS — Z760 Encounter for issue of repeat prescription: Secondary | ICD-10-CM | POA: Diagnosis not present

## 2023-05-24 ENCOUNTER — Other Ambulatory Visit: Payer: Self-pay | Admitting: Family Medicine

## 2023-05-24 DIAGNOSIS — Z1231 Encounter for screening mammogram for malignant neoplasm of breast: Secondary | ICD-10-CM

## 2023-06-01 ENCOUNTER — Ambulatory Visit: Payer: Medicare Other | Admitting: Urology

## 2023-06-06 NOTE — Progress Notes (Signed)
history of Present Illness: Cynthia Adams is a 78 y.o. year old female here for follow-up of recurrent UTIs/pyuria.  At her first visit in October 2024 she was placed on perivaginal estrogen cream.  For couple of months she did well on the cream.  She started to become symptomatic with vaginal discomfort, dysuria, foul-smelling urine and cloudy urine Christmas morning.  She has not been on any antibiotics, but symptoms continue.  Additionally, she feels like something has dropped in her vaginal area.  Past Medical History:  Diagnosis Date   Cataract 2017   Cataract surgery in 01-2016 and 02-2016   GERD (gastroesophageal reflux disease)    History of iron deficiency 06/13/2014   Hyperlipidemia    Hypertension    Nerve pain    Seasonal allergies     Past Surgical History:  Procedure Laterality Date   ABDOMINAL HYSTERECTOMY     BREAST BIOPSY Right    BREAST CYST EXCISION Right    CATARACT EXTRACTION W/ INTRAOCULAR LENS  IMPLANT, BILATERAL Bilateral 01-2016, 02-2016   CATARACT EXTRACTION W/ INTRAOCULAR LENS & ANTERIOR VITRECTOMY Bilateral 01-29-2016, 02-19-2016   Right eye in 01-2016 and Left eye 02-2016   CERVICAL FUSION  02/11/2020   CESAREAN SECTION     CESAREAN SECTION     CHOLECYSTECTOMY     LUMBAR DISC SURGERY     TONSILLECTOMY      Home Medications:  (Not in a hospital admission)   Allergies:  Allergies  Allergen Reactions   Amoxicillin    Lovaza [Omega-3-Acid Ethyl Esters (Fish)] Diarrhea   Macrobid [Nitrofurantoin]    Minocycline Diarrhea, Nausea And Vomiting and Other (See Comments)    Stomach pain     Pantoprazole Swelling   Sulfa Antibiotics Swelling   Amoxicillin-Pot Clavulanate Diarrhea   Lipitor [Atorvastatin] Other (See Comments)    Diarrhea and insomnia   Pravastatin Diarrhea   Rosuvastatin Swelling    Family History  Problem Relation Age of Onset   Cancer Mother        breast   Dementia Mother    Hypertension Mother    Hyperlipidemia Mother     Breast cancer Mother    Dementia Sister    Hypertension Sister    Hyperlipidemia Sister    Hyperlipidemia Brother    Hypertension Brother    Hypertension Son    Dementia Maternal Aunt    Breast cancer Maternal Aunt    Cancer Paternal Uncle    Alcohol abuse Paternal Uncle    Stroke Maternal Grandmother    Heart disease Paternal Grandmother    Hyperlipidemia Sister    Hypertension Sister    Hyperlipidemia Sister    Hypertension Sister    Dementia Maternal Aunt    Breast cancer Maternal Aunt    Dementia Maternal Aunt    Breast cancer Maternal Aunt    Alcohol abuse Paternal Uncle     Social History:  reports that she has never smoked. She has never used smokeless tobacco. She reports that she does not currently use alcohol. She reports that she does not use drugs.  ROS: A complete review of systems was performed.  All systems are negative except for pertinent findings as noted.  Physical Exam:  Vital signs in last 24 hours: @VSRANGES @ General:  Alert and oriented, No acute distress HEENT: Normocephalic, atraumatic Neck: No JVD or lymphadenopathy Cardiovascular: Regular rate  Lungs: Normal inspiratory/expiratory excursion GU: Mild vaginal atrophic changes.  No external lesions.  Grade 1-2 cystocele.  No significant rectocele.  Extremities: No edema Neurologic: Grossly intact  I have reviewed prior pt notes  I have reviewed urinalysis results  I have independently reviewed prior imaging  I have reviewed prior urine culture from before her first visit  In-N-Out catheterization was done for urine collection  Impression/Assessment:  1.  Recurrent urinary tract infections, currently symptomatic  2.  Vaginal atrophic changes, on estrogen cream  Plan:  1.  Urine was cultured, she was given 5 days of Keflex 500 mg twice a day  2.  After Keflex completed, she will be starting at least a 9-month course of methenamine hippurate 1 g twice a day  3.  I will have her come  back in in about 6 weeks to recheck.  Bertram Millard Lache Dagher 06/06/2023, 1:06 PM  Bertram Millard. Elliette Seabolt MD

## 2023-06-12 ENCOUNTER — Other Ambulatory Visit: Payer: Self-pay | Admitting: Family Medicine

## 2023-06-13 ENCOUNTER — Ambulatory Visit: Payer: Medicare Other | Admitting: Urology

## 2023-06-13 VITALS — BP 137/60 | HR 51 | Ht 60.0 in | Wt 120.0 lb

## 2023-06-13 DIAGNOSIS — N952 Postmenopausal atrophic vaginitis: Secondary | ICD-10-CM | POA: Diagnosis not present

## 2023-06-13 DIAGNOSIS — N3020 Other chronic cystitis without hematuria: Secondary | ICD-10-CM

## 2023-06-13 LAB — MICROSCOPIC EXAMINATION
Casts: NONE SEEN /[LPF]
Crystals: NONE SEEN
Mucus, UA: NONE SEEN
Renal Epithel, UA: NONE SEEN /[HPF]
Trichomonas, UA: NONE SEEN
WBC, UA: >30 /[HPF] — AB (ref 0–5)
Yeast, UA: NONE SEEN

## 2023-06-13 LAB — URINALYSIS, ROUTINE W REFLEX MICROSCOPIC
Bilirubin, UA: NEGATIVE
Glucose, UA: NEGATIVE
Ketones, UA: NEGATIVE
Nitrite, UA: POSITIVE — AB
Specific Gravity, UA: 1.025 (ref 1.005–1.030)
Urobilinogen, Ur: 0.2 mg/dL (ref 0.2–1.0)
pH, UA: 5.5 (ref 5.0–7.5)

## 2023-06-13 MED ORDER — METHENAMINE MANDELATE 1 G PO TABS: 1000.0000 mg | ORAL_TABLET | Freq: Two times a day (BID) | ORAL | 11 refills | Status: AC

## 2023-06-13 MED ORDER — CEPHALEXIN 500 MG PO CAPS: 500.0000 mg | ORAL_CAPSULE | Freq: Two times a day (BID) | ORAL | 0 refills | Status: AC

## 2023-06-16 DIAGNOSIS — Z760 Encounter for issue of repeat prescription: Secondary | ICD-10-CM | POA: Diagnosis not present

## 2023-06-16 DIAGNOSIS — G894 Chronic pain syndrome: Secondary | ICD-10-CM | POA: Diagnosis not present

## 2023-06-16 DIAGNOSIS — Z79899 Other long term (current) drug therapy: Secondary | ICD-10-CM | POA: Diagnosis not present

## 2023-06-16 LAB — URINE CULTURE

## 2023-06-27 ENCOUNTER — Other Ambulatory Visit: Payer: Self-pay

## 2023-06-27 ENCOUNTER — Telehealth: Payer: Self-pay

## 2023-06-27 DIAGNOSIS — N3020 Other chronic cystitis without hematuria: Secondary | ICD-10-CM

## 2023-06-27 NOTE — Telephone Encounter (Signed)
Called pt to verify her antibiotic was helping Pt states its working however the methenamine she believes is not working well

## 2023-06-27 NOTE — Telephone Encounter (Signed)
Called Pt to schedule urine drop off per MD Pt states they will not come to our office here in [place] but they can go to [place]

## 2023-06-27 NOTE — Telephone Encounter (Signed)
-----   Message from Nurse [facility] sent at 06/27/2023  2:28 PM EST -----  ----- Message ----- From: Marcine Matar, MD Sent: 06/27/2023  12:53 PM EST To: Gustavus Messing, LPN; Troy Sine, CMA; #  Please call patient, find out if she thinks the antibiotic worked for her UTI.  If she is still symptomatic, I may need to send something else in. ----- Message ----- From: Interface, [facility] Lab Results In Sent: 06/13/2023   1:36 PM EST To: Marcine Matar, MD

## 2023-07-13 ENCOUNTER — Ambulatory Visit: Payer: Medicare Other

## 2023-07-13 DIAGNOSIS — Z1231 Encounter for screening mammogram for malignant neoplasm of breast: Secondary | ICD-10-CM

## 2023-07-14 DIAGNOSIS — Z79899 Other long term (current) drug therapy: Secondary | ICD-10-CM | POA: Diagnosis not present

## 2023-07-14 DIAGNOSIS — L821 Other seborrheic keratosis: Secondary | ICD-10-CM | POA: Diagnosis not present

## 2023-07-14 DIAGNOSIS — Z760 Encounter for issue of repeat prescription: Secondary | ICD-10-CM | POA: Diagnosis not present

## 2023-07-14 DIAGNOSIS — L579 Skin changes due to chronic exposure to nonionizing radiation, unspecified: Secondary | ICD-10-CM | POA: Diagnosis not present

## 2023-07-14 DIAGNOSIS — D225 Melanocytic nevi of trunk: Secondary | ICD-10-CM | POA: Diagnosis not present

## 2023-07-14 DIAGNOSIS — L814 Other melanin hyperpigmentation: Secondary | ICD-10-CM | POA: Diagnosis not present

## 2023-07-14 DIAGNOSIS — G894 Chronic pain syndrome: Secondary | ICD-10-CM | POA: Diagnosis not present

## 2023-07-14 DIAGNOSIS — D235 Other benign neoplasm of skin of trunk: Secondary | ICD-10-CM | POA: Diagnosis not present

## 2023-07-26 NOTE — Progress Notes (Signed)
 History of Present Illness:  Is here today for follow-up of recurrent cystitis/vaginal atrophic changes.  Her first visit was in early February.  She did have a symptomatic UTI at that time.  Culture grew a multidrug resistant E. coli.  She was treated with cephalexin, and put on perivaginal estrogen cream.  She was also started on hippurex.  She still has dysuria and vaginal discomfort.  She still feels like she has a urinary tract infection.  She does feel like the estrogen cream has been helpful with her vaginal discomfort, however.  She has not had a fever since her last visit, she has not had hematuria.  She is still using the methenamine and the estrogen cream.   Past Medical History:  Past Medical History:  Diagnosis Date   Cataract 2017   Cataract surgery in 01-2016 and 02-2016   GERD (gastroesophageal reflux disease)    History of iron deficiency 06/13/2014   Hyperlipidemia    Hypertension    Nerve pain    Seasonal allergies     Past Surgical History:  Past Surgical History:  Procedure Laterality Date   ABDOMINAL HYSTERECTOMY     BREAST BIOPSY Right    BREAST CYST EXCISION Right    CATARACT EXTRACTION W/ INTRAOCULAR LENS  IMPLANT, BILATERAL Bilateral 01-2016, 02-2016   CATARACT EXTRACTION W/ INTRAOCULAR LENS & ANTERIOR VITRECTOMY Bilateral 01-29-2016, 02-19-2016   Right eye in 01-2016 and Left eye 02-2016   CERVICAL FUSION  02/11/2020   CESAREAN SECTION     CESAREAN SECTION     CHOLECYSTECTOMY     LUMBAR DISC SURGERY     TONSILLECTOMY      Allergies:  Allergies  Allergen Reactions   Amoxicillin    Lovaza [Omega-3-Acid Ethyl Esters (Fish)] Diarrhea   Macrobid [Nitrofurantoin]    Minocycline Diarrhea, Nausea And Vomiting and Other (See Comments)    Stomach pain     Pantoprazole Swelling   Sulfa Antibiotics Swelling   Amoxicillin-Pot Clavulanate Diarrhea   Lipitor [Atorvastatin] Other (See Comments)    Diarrhea and insomnia   Pravastatin Diarrhea    Rosuvastatin Swelling    Family History:  Family History  Problem Relation Age of Onset   Cancer Mother        breast   Dementia Mother    Hypertension Mother    Hyperlipidemia Mother    Breast cancer Mother    Dementia Sister    Hypertension Sister    Hyperlipidemia Sister    Hyperlipidemia Brother    Hypertension Brother    Hypertension Son    Dementia Maternal Aunt    Breast cancer Maternal Aunt    Cancer Paternal Uncle    Alcohol abuse Paternal Uncle    Stroke Maternal Grandmother    Heart disease Paternal Grandmother    Hyperlipidemia Sister    Hypertension Sister    Hyperlipidemia Sister    Hypertension Sister    Dementia Maternal Aunt    Breast cancer Maternal Aunt    Dementia Maternal Aunt    Breast cancer Maternal Aunt    Alcohol abuse Paternal Uncle     Social History:  Social History   Tobacco Use   Smoking status: Never   Smokeless tobacco: Never  Vaping Use   Vaping status: Never Used  Substance Use Topics   Alcohol use: Not Currently    Comment: .   Drug use: Never    Review of symptoms:  Constitutional:  Negative for unexplained weight loss,  night sweats, fever, chills ENT:  Negative for nose bleeds, sinus pain, painful swallowing CV:  Negative for chest pain, shortness of breath, exercise intolerance, palpitations, loss of consciousness Resp:  Negative for cough, wheezing, shortness of breath GI:  Negative for nausea, vomiting, diarrhea, bloody stools GU:  Positives noted in HPI; Neuro:  Negative for seizures, poor balance, limb weakness, slurred speech Psych:  Negative for lack of energy, depression, anxiety Endocrine:  Negative for polydipsia, polyuria, symptoms of hypoglycemia (dizziness, hunger, sweating) Hematologic:  Negative for anemia, purpura, petechia, prolonged or excessive bleeding, use of anticoagulants  Allergic:  Negative for difficulty breathing or choking as a result of exposure to anything; no shellfish allergy; no allergic  response (rash/itch) to materials, foods  Physical exam: There were no vitals taken for this visit. GENERAL APPEARANCE:  Well appearing, well developed, well nourished, NAD HEENT: Atraumatic, Normocephalic. NECK: Normal in appearance LUNGS: Normal inspiratory and expiratory excursion. HEART: Regular Rate. NEUROLOGIC:  Alert and oriented x 3, normal gait, CN II-XII grossly intact.  MENTAL STATUS:  Appropriate. SKIN:  Warm, dry and intact.    Results: No results found for this or any previous visit (from the past 24 hours).  I have reviewed prior patient's records  I have reviewed urinalysis  I have reviewed prior urine cultures   Assessment: No diagnosis found. -Vaginal atrophic changes, on estrogen cream, using this appropriately  -Recurrent urinary tract infections, it looks like she has a mild UTI today  Plan: -I will send urine for culture.  No antibiotic prescribed yet  -I told her to use cream 3 nights a week, stop the methenamine while on an antibiotic  -I will have her come in in 2 months to recheck

## 2023-08-01 ENCOUNTER — Ambulatory Visit: Payer: Medicare Other | Admitting: Urology

## 2023-08-01 ENCOUNTER — Encounter: Payer: Self-pay | Admitting: Urology

## 2023-08-01 VITALS — BP 152/66 | HR 55 | Ht 60.0 in | Wt 120.0 lb

## 2023-08-01 DIAGNOSIS — N898 Other specified noninflammatory disorders of vagina: Secondary | ICD-10-CM

## 2023-08-01 DIAGNOSIS — N3020 Other chronic cystitis without hematuria: Secondary | ICD-10-CM

## 2023-08-01 DIAGNOSIS — R300 Dysuria: Secondary | ICD-10-CM | POA: Diagnosis not present

## 2023-08-01 DIAGNOSIS — Z87440 Personal history of urinary (tract) infections: Secondary | ICD-10-CM

## 2023-08-01 LAB — URINALYSIS, ROUTINE W REFLEX MICROSCOPIC
Bilirubin, UA: NEGATIVE
Glucose, UA: NEGATIVE
Ketones, UA: NEGATIVE
Nitrite, UA: NEGATIVE
Specific Gravity, UA: >1.03 — ABNORMAL HIGH (ref 1.005–1.030)
Urobilinogen, Ur: 0.2 mg/dL (ref 0.2–1.0)
pH, UA: 5.5 (ref 5.0–7.5)

## 2023-08-01 LAB — MICROSCOPIC EXAMINATION

## 2023-08-03 LAB — URINE CULTURE

## 2023-08-08 ENCOUNTER — Telehealth: Payer: Self-pay

## 2023-08-08 ENCOUNTER — Other Ambulatory Visit: Payer: Medicare Other

## 2023-08-08 NOTE — Telephone Encounter (Signed)
 Called to relay message from MD see last telephone encounter

## 2023-08-08 NOTE — Telephone Encounter (Signed)
-----   Message from Bertram Millard Dahlstedt sent at 08/07/2023  6:36 PM EDT ----- Let pt know that her urine did not grow out bacteria which would cause uti--no abx needed ----- Message ----- From: Interface, [facility] Lab Results In Sent: 08/01/2023  10:37 AM EDT To: Marcine Matar, MD

## 2023-08-11 DIAGNOSIS — Z760 Encounter for issue of repeat prescription: Secondary | ICD-10-CM | POA: Diagnosis not present

## 2023-08-11 DIAGNOSIS — Z79899 Other long term (current) drug therapy: Secondary | ICD-10-CM | POA: Diagnosis not present

## 2023-08-11 DIAGNOSIS — G894 Chronic pain syndrome: Secondary | ICD-10-CM | POA: Diagnosis not present

## 2023-09-04 ENCOUNTER — Other Ambulatory Visit: Payer: Self-pay | Admitting: Family Medicine

## 2023-09-04 DIAGNOSIS — R232 Flushing: Secondary | ICD-10-CM

## 2023-09-30 ENCOUNTER — Encounter: Admitting: Urology

## 2023-09-30 NOTE — Progress Notes (Signed)
 This encounter was created in error - please disregard.

## 2023-10-04 NOTE — Progress Notes (Signed)
 History of Present Illness: This woman comes back for follow-up.  I first saw her earlier this year for recurrent urinary tract infections.  She was seen a couple of months ago for her first recheck.  It looks like she had a UTI at that time.  I have placed her on methenamine  suppression as well as estrogen cream.  She comes in today stating that she has used neither of these medicine since her last visit.  She denies any symptoms of her lower urinary tract infection.   Past Medical History:  Past Medical History:  Diagnosis Date   Cataract 2017   Cataract surgery in 01-2016 and 02-2016   GERD (gastroesophageal reflux disease)    History of iron deficiency 06/13/2014   Hyperlipidemia    Hypertension    Nerve pain    Seasonal allergies     Past Surgical History:  Past Surgical History:  Procedure Laterality Date   ABDOMINAL HYSTERECTOMY     BREAST BIOPSY Right    BREAST CYST EXCISION Right    CATARACT EXTRACTION W/ INTRAOCULAR LENS  IMPLANT, BILATERAL Bilateral 01-2016, 02-2016   CATARACT EXTRACTION W/ INTRAOCULAR LENS & ANTERIOR VITRECTOMY Bilateral 01-29-2016, 02-19-2016   Right eye in 01-2016 and Left eye 02-2016   CERVICAL FUSION  02/11/2020   CESAREAN SECTION     CESAREAN SECTION     CHOLECYSTECTOMY     LUMBAR DISC SURGERY     TONSILLECTOMY      Allergies:  Allergies  Allergen Reactions   Amoxicillin     Lovaza  [Omega-3-Acid  Ethyl Esters (Fish)] Diarrhea   Macrobid  [Nitrofurantoin ]    Minocycline Diarrhea, Nausea And Vomiting and Other (See Comments)    Stomach pain     Pantoprazole  Swelling   Sulfa Antibiotics Swelling   Amoxicillin -Pot Clavulanate Diarrhea   Lipitor [Atorvastatin ] Other (See Comments)    Diarrhea and insomnia   Pravastatin  Diarrhea   Rosuvastatin  Swelling    Family History:  Family History  Problem Relation Age of Onset   Cancer Mother        breast   Dementia Mother    Hypertension Mother    Hyperlipidemia Mother    Breast  cancer Mother    Dementia Sister    Hypertension Sister    Hyperlipidemia Sister    Hyperlipidemia Brother    Hypertension Brother    Hypertension Son    Dementia Maternal Aunt    Breast cancer Maternal Aunt    Cancer Paternal Uncle    Alcohol abuse Paternal Uncle    Stroke Maternal Grandmother    Heart disease Paternal Grandmother    Hyperlipidemia Sister    Hypertension Sister    Hyperlipidemia Sister    Hypertension Sister    Dementia Maternal Aunt    Breast cancer Maternal Aunt    Dementia Maternal Aunt    Breast cancer Maternal Aunt    Alcohol abuse Paternal Uncle     Social History:  Social History   Tobacco Use   Smoking status: Never   Smokeless tobacco: Never  Vaping Use   Vaping status: Never Used  Substance Use Topics   Alcohol use: Not Currently    Comment: .   Drug use: Never    Review of symptoms:  Constitutional:  Negative for unexplained weight loss, night sweats, fever, chills ENT:  Negative for nose bleeds, sinus pain, painful swallowing CV:  Negative for chest pain, shortness of breath, exercise intolerance, palpitations, loss of consciousness Resp:  Negative for  cough, wheezing, shortness of breath GI:  Negative for nausea, vomiting, diarrhea, bloody stools GU:  Positives noted in HPI; Neuro:  Negative for seizures, poor balance, limb weakness, slurred speech Psych:  Negative for lack of energy, depression, anxiety Endocrine:  Negative for polydipsia, polyuria, symptoms of hypoglycemia (dizziness, hunger, sweating) Hematologic:  Negative for anemia, purpura, petechia, prolonged or excessive bleeding, use of anticoagulants  Allergic:  Negative for difficulty breathing or choking as a result of exposure to anything; no shellfish allergy; no allergic response (rash/itch) to materials, foods  Physical exam: GENERAL APPEARANCE:  Well appearing, well developed, well nourished, NAD HEENT: Atraumatic, Normocephalic. NECK: Normal in appearance LUNGS:  Normal inspiratory and expiratory excursion. HEART: Regular Rate. NEUROLOGIC:  Alert and oriented x 3, normal gait, CN II-XII grossly intact.  MENTAL STATUS:  Appropriate. SKIN:  Warm, dry and intact.    Results:  She is unable to provide a urinary specimen today  I have reviewed prior patient's records  I have reviewed prior urine cultures   Assessment: No diagnosis found. -Vaginal atrophic changes, she is not using estrogen cream as prescribed  -Recurrent urinary tract infections.  She has been given methenamine  to help prevent these, she has not been on that medication  Plan: - I instructed the patient that she is to use cream in her perivaginal area estrogen 2-3 nights a week.  This is to be done long-term to help prevent infections  -I also instructed her she should be using methenamine  twice a day on a long-term basis  - I wrote out instructions for both of these to be done  - I will see back in 6 months

## 2023-10-05 ENCOUNTER — Ambulatory Visit: Admitting: Urology

## 2023-10-05 VITALS — BP 171/75 | HR 54 | Ht 60.0 in | Wt 100.0 lb

## 2023-10-05 DIAGNOSIS — N952 Postmenopausal atrophic vaginitis: Secondary | ICD-10-CM

## 2023-10-05 DIAGNOSIS — Z87440 Personal history of urinary (tract) infections: Secondary | ICD-10-CM

## 2023-10-05 DIAGNOSIS — N3020 Other chronic cystitis without hematuria: Secondary | ICD-10-CM

## 2023-10-27 DIAGNOSIS — Z79899 Other long term (current) drug therapy: Secondary | ICD-10-CM | POA: Diagnosis not present

## 2023-10-27 DIAGNOSIS — Z79891 Long term (current) use of opiate analgesic: Secondary | ICD-10-CM | POA: Diagnosis not present

## 2023-10-27 DIAGNOSIS — G894 Chronic pain syndrome: Secondary | ICD-10-CM | POA: Diagnosis not present

## 2023-10-27 DIAGNOSIS — Z760 Encounter for issue of repeat prescription: Secondary | ICD-10-CM | POA: Diagnosis not present

## 2023-12-29 DIAGNOSIS — Z79891 Long term (current) use of opiate analgesic: Secondary | ICD-10-CM | POA: Diagnosis not present

## 2023-12-29 DIAGNOSIS — Z79899 Other long term (current) drug therapy: Secondary | ICD-10-CM | POA: Diagnosis not present

## 2023-12-29 DIAGNOSIS — Z760 Encounter for issue of repeat prescription: Secondary | ICD-10-CM | POA: Diagnosis not present

## 2023-12-29 DIAGNOSIS — G894 Chronic pain syndrome: Secondary | ICD-10-CM | POA: Diagnosis not present

## 2024-01-12 ENCOUNTER — Encounter: Payer: Self-pay | Admitting: Sports Medicine

## 2024-02-08 ENCOUNTER — Ambulatory Visit: Payer: Self-pay

## 2024-02-08 NOTE — Telephone Encounter (Signed)
 FYI Only or Action Required?: FYI only for provider.  Patient was last seen in primary care on 03/23/2023 by Alvia Bring, DO.  Called Nurse Triage reporting Abdominal Pain.  Symptoms began about a month ago.  Interventions attempted: Nothing.  Symptoms are: gradually worsening.  Triage Disposition: See Physician Within 24 Hours  Patient/caregiver understands and will follow disposition?: Yes     Copied from CRM #[id number]. Topic: Clinical - Red Word Triage >> Feb 08, 2024  1:19 PM Donee H wrote: [facility] that prompted transfer to Nurse Triage: Patient states experiencing issues with stomach. She states having pain, diarrhea, and at times can't go to the bathroom. Also states hurts across stomach area. She states has been going on since the first of Sept. Would like to see pcp and states need a Monday or Tuesday appointment Reason for Disposition  [1] MODERATE pain (e.g., interferes with normal activities) AND [2] pain comes and goes (cramps) AND [3] present > 24 hours  (Exception: Pain with Vomiting or Diarrhea - see that Guideline.)  Answer Assessment - Initial Assessment Questions Patient states she has not taking otc meds for symptoms. Patient states she avoids foods for symptoms. Patient denies vomiting and states she is staying hydrated. Patient states she has had some issues with her memory as well and is concerned about this issue.    1. LOCATION: Where does it hurt?      Across stomach  2. RADIATION: Does the pain shoot anywhere else? (e.g., chest, back)     Goes across abdominal area  3. ONSET: When did the pain begin? (e.g., minutes, hours or days ago)      Sept 1st  4. SUDDEN: Gradual or sudden onset?     Sudden  5. PATTERN Does the pain come and go, or is it constant?     Comes and goes  6. SEVERITY: How bad is the pain?  (e.g., Scale 1-10; mild, moderate, or severe)     4/5 out of 10 7. RELIEVING/AGGRAVATING FACTORS: What makes it better or worse?  (e.g., antacids, bending or twisting motion, bowel movement)     Eating causes some pain  8. OTHER SYMPTOMS: Do you have any other symptoms? (e.g., back pain, diarrhea, fever, urination pain, vomiting)       Diarrhea is dark brown/ black at times but states it is mostly a normal brown color.  Protocols used: Abdominal Pain - Female-A-AH

## 2024-02-08 NOTE — Telephone Encounter (Signed)
 Patient is scheduled for 02/09/2024 with Dr. Alvia.

## 2024-02-09 ENCOUNTER — Encounter: Payer: Self-pay | Admitting: Family Medicine

## 2024-02-09 ENCOUNTER — Ambulatory Visit (INDEPENDENT_AMBULATORY_CARE_PROVIDER_SITE_OTHER)

## 2024-02-09 ENCOUNTER — Ambulatory Visit: Admitting: Family Medicine

## 2024-02-09 VITALS — BP 129/86 | HR 77 | Ht 60.0 in | Wt 115.0 lb

## 2024-02-09 DIAGNOSIS — R4189 Other symptoms and signs involving cognitive functions and awareness: Secondary | ICD-10-CM | POA: Diagnosis not present

## 2024-02-09 DIAGNOSIS — R197 Diarrhea, unspecified: Secondary | ICD-10-CM | POA: Diagnosis not present

## 2024-02-09 DIAGNOSIS — K5900 Constipation, unspecified: Secondary | ICD-10-CM

## 2024-02-09 DIAGNOSIS — R4701 Aphasia: Secondary | ICD-10-CM | POA: Diagnosis not present

## 2024-02-09 DIAGNOSIS — R1030 Lower abdominal pain, unspecified: Secondary | ICD-10-CM

## 2024-02-09 DIAGNOSIS — Z981 Arthrodesis status: Secondary | ICD-10-CM | POA: Diagnosis not present

## 2024-02-09 NOTE — Progress Notes (Signed)
 Cynthia Adams - 78 y.o. female MRN [id number]  Date of birth: Oct 08, 1945  Subjective Chief Complaint  Patient presents with   Aphasia   Abdominal Pain    HPI Cynthia Adams is a 78 y.o. female here today with complaint of abdominal pain.    She has had abdominal pain and difficulty having bowel movements for the past several weeks.  She states she will go several days without a bowel movement and then sometimes have fecal urgency with diarrhea.  Pain is located across the lower abdomen.  She  has not seen blood in her stool.  Denies nausea and appetite is ok. Last colonoscopy was in 2016, noted to have diverticulosis but no polyps.   She has also noted some cognitive changes.  Reports that a few months ago she was sitting at home and she felt like there brain just went crazy.  She has had some mild memory issues and tends to repeat herself.  She has difficulty with word finding.  States that she knows what she wants to say but has trouble getting it out.  Sometimes she tends to say the wrong thing in relation to what she is thinking.  She has not had any headaches or dizziness.  ROS:  A comprehensive ROS was completed and negative except as noted per HPI  Allergies  Allergen Reactions   Amoxicillin     Lovaza  [Omega-3-Acid  Ethyl Esters (Fish)] Diarrhea   Macrobid  [Nitrofurantoin ]    Minocycline Diarrhea, Nausea And Vomiting and Other (See Comments)    Stomach pain     Pantoprazole  Swelling   Sulfa Antibiotics Swelling   Amoxicillin -Pot Clavulanate Diarrhea   Lipitor [Atorvastatin ] Other (See Comments)    Diarrhea and insomnia   Pravastatin  Diarrhea   Rosuvastatin  Swelling    Past Medical History:  Diagnosis Date   Cataract 2017   Cataract surgery in 01-2016 and 02-2016   GERD (gastroesophageal reflux disease)    History of iron deficiency 06/13/2014   Hyperlipidemia    Hypertension    Nerve pain    Seasonal allergies     Past Surgical History:  Procedure Laterality  Date   ABDOMINAL HYSTERECTOMY     BREAST BIOPSY Right    BREAST CYST EXCISION Right    CATARACT EXTRACTION W/ INTRAOCULAR LENS  IMPLANT, BILATERAL Bilateral 01-2016, 02-2016   CATARACT EXTRACTION W/ INTRAOCULAR LENS & ANTERIOR VITRECTOMY Bilateral 01-29-2016, 02-19-2016   Right eye in 01-2016 and Left eye 02-2016   CERVICAL FUSION  02/11/2020   CESAREAN SECTION     CESAREAN SECTION     CHOLECYSTECTOMY     LUMBAR DISC SURGERY     TONSILLECTOMY      Social History   Socioeconomic History   Marital status: Married    Spouse name: John   Number of children: 2   Years of education: 12   Highest education level: 12th grade  Occupational History   Occupation: Print production planner    Comment: retired  Tobacco Use   Smoking status: Never   Smokeless tobacco: Never  Vaping Use   Vaping status: Never Used  Substance and Sexual Activity   Alcohol use: Not Currently    Comment: .   Drug use: Never   Sexual activity: Not Currently  Other Topics Concern   Not on file  Social History Narrative   Lives with her husband. She has two children. She enjoys reading.   Social Drivers of Health   Financial Resource Strain: Low Risk  (03/23/2023)  Overall Financial Resource Strain (CARDIA)    Difficulty of Paying Living Expenses: Not hard at all  Food Insecurity: No Food Insecurity (03/23/2023)   Hunger Vital Sign    Worried About Running Out of Food in the Last Year: Never true    Ran Out of Food in the Last Year: Never true  Transportation Needs: No Transportation Needs (03/23/2023)   PRAPARE - Administrator, Civil Service (Medical): No    Lack of Transportation (Non-Medical): No  Physical Activity: Inactive (03/23/2023)   Exercise Vital Sign    Days of Exercise per Week: 0 days    Minutes of Exercise per Session: 0 min  Stress: No Stress Concern Present (03/23/2023)   [employer] of Occupational Health - Occupational Stress Questionnaire    Feeling of Stress : Not  at all  Social Connections: Moderately Integrated (03/23/2023)   Social Connection and Isolation Panel    Frequency of Communication with Friends and Family: Twice a week    Frequency of Social Gatherings with Friends and Family: Twice a week    Attends Religious Services: More than 4 times per year    Active Member of [employer] or Organizations: No    Attends Engineer, structural: Never    Marital Status: Married    Family History  Problem Relation Age of Onset   Cancer Mother        breast   Dementia Mother    Hypertension Mother    Hyperlipidemia Mother    Breast cancer Mother    Dementia Sister    Hypertension Sister    Hyperlipidemia Sister    Hyperlipidemia Brother    Hypertension Brother    Hypertension Son    Dementia Maternal Aunt    Breast cancer Maternal Aunt    Cancer Paternal Uncle    Alcohol abuse Paternal Uncle    Stroke Maternal Grandmother    Heart disease Paternal Grandmother    Hyperlipidemia Sister    Hypertension Sister    Hyperlipidemia Sister    Hypertension Sister    Dementia Maternal Aunt    Breast cancer Maternal Aunt    Dementia Maternal Aunt    Breast cancer Maternal Aunt    Alcohol abuse Paternal Uncle     Health Maintenance  Topic Date Due   DEXA SCAN  04/13/2023   Influenza Vaccine  12/09/2023   COVID-19 Vaccine (6 - 2025-26 season) 01/09/2024   Medicare Annual Wellness (AWV)  03/22/2024   DTaP/Tdap/Td (3 - Td or Tdap) 08/15/2024   Pneumococcal Vaccine: 50+ Years  Completed   Hepatitis C Screening  Completed   Zoster Vaccines- Shingrix  Completed   HPV VACCINES  Aged Out   Meningococcal B Vaccine  Aged Out   Mammogram  Discontinued   Colonoscopy  Discontinued     ----------------------------------------------------------------------------------------------------------------------------------------------------------------------------------------------------------------- Physical Exam BP 129/86 (BP Location: Left Arm,  Patient Position: Sitting, Cuff Size: Normal)   Pulse 77   Ht 5' (1.524 m)   Wt 115 lb (52.2 kg)   SpO2 99%   BMI 22.46 kg/m   Physical Exam Constitutional:      Appearance: She is well-developed.  HENT:     Head: Normocephalic and atraumatic.  Eyes:     General: No scleral icterus. Cardiovascular:     Rate and Rhythm: Normal rate and regular rhythm.  Pulmonary:     Effort: Pulmonary effort is normal.     Breath sounds: Normal breath sounds.  Abdominal:  General: Abdomen is flat.  Musculoskeletal:     Cervical back: Neck supple.  Neurological:     General: No focal deficit present.     Mental Status: She is alert.     Comments: Strength is normal throughout.  Cranial nerves intact grossly.  Psychiatric:        Mood and Affect: Mood normal.        Behavior: Behavior normal.    ------------------------------------------------------------------------------------------------------------------------------------------------------------------------------------------------------------------- Assessment and Plan  Lower abdominal pain Think her abdominal pain is likely related to constipation.  She is prescribed chronic opioids for low back pain and her constipation is likely related to this.  Encouraged good fiber intake as well as good fluid intake.  KUB ordered today.  Cognitive changes Some cognitive changes.  It seems that she may have some expressive aphasia.  MRI of the brain ordered.  Checking B12, TSH, RPR.    No orders of the defined types were placed in this encounter.   No follow-ups on file.

## 2024-02-09 NOTE — Patient Instructions (Addendum)
 We are checking labs today for reversible causes of cognitive change I have also ordered and MRI.  I don't think Prevagen will provide much improvement   I have ordered an Xray of you abdomen.  Please stop downstairs to have this completed.

## 2024-02-09 NOTE — Assessment & Plan Note (Signed)
 Think her abdominal pain is likely related to constipation.  She is prescribed chronic opioids for low back pain and her constipation is likely related to this.  Encouraged good fiber intake as well as good fluid intake.  KUB ordered today.

## 2024-02-09 NOTE — Assessment & Plan Note (Signed)
 Some cognitive changes.  It seems that she may have some expressive aphasia.  MRI of the brain ordered.  Checking B12, TSH, RPR.

## 2024-02-10 LAB — CMP14+EGFR
ALT: 9 IU/L (ref 0–32)
AST: 22 IU/L (ref 0–40)
Albumin: 4.6 g/dL (ref 3.8–4.8)
Alkaline Phosphatase: 86 IU/L (ref 49–135)
BUN/Creatinine Ratio: 13 (ref 12–28)
BUN: 12 mg/dL (ref 8–27)
Bilirubin Total: 0.7 mg/dL (ref 0.0–1.2)
CO2: 23 mmol/L (ref 20–29)
Calcium: 9.9 mg/dL (ref 8.7–10.3)
Chloride: 99 mmol/L (ref 96–106)
Creatinine, Ser: 0.9 mg/dL (ref 0.57–1.00)
Globulin, Total: 2.5 g/dL (ref 1.5–4.5)
Glucose: 88 mg/dL (ref 70–99)
Potassium: 4.2 mmol/L (ref 3.5–5.2)
Sodium: 139 mmol/L (ref 134–144)
Total Protein: 7.1 g/dL (ref 6.0–8.5)
eGFR: 66 mL/min/1.73 (ref >59)

## 2024-02-10 LAB — CBC WITH DIFFERENTIAL/PLATELET
Basophils Absolute: 0.1 x10E3/uL (ref 0.0–0.2)
Basos: 1 %
EOS (ABSOLUTE): 0 x10E3/uL (ref 0.0–0.4)
Eos: 0 %
Hematocrit: 40.5 % (ref 34.0–46.6)
Hemoglobin: 13.2 g/dL (ref 11.1–15.9)
Immature Grans (Abs): 0 x10E3/uL (ref 0.0–0.1)
Immature Granulocytes: 0 %
Lymphocytes Absolute: 1.9 x10E3/uL (ref 0.7–3.1)
Lymphs: 24 %
MCH: 30.5 pg (ref 26.6–33.0)
MCHC: 32.6 g/dL (ref 31.5–35.7)
MCV: 94 fL (ref 79–97)
Monocytes Absolute: 0.5 x10E3/uL (ref 0.1–0.9)
Monocytes: 6 %
Neutrophils Absolute: 5.5 x10E3/uL (ref 1.4–7.0)
Neutrophils: 69 %
Platelets: 268 x10E3/uL (ref 150–450)
RBC: 4.33 x10E6/uL (ref 3.77–5.28)
RDW: 12 % (ref 11.7–15.4)
WBC: 7.9 x10E3/uL (ref 3.4–10.8)

## 2024-02-10 LAB — TSH: TSH: 2.05 u[IU]/mL (ref 0.450–4.500)

## 2024-02-10 LAB — RPR: RPR Ser Ql: NONREACTIVE

## 2024-02-10 LAB — VITAMIN B12: Vitamin B-12: 386 pg/mL (ref 232–1245)

## 2024-02-13 ENCOUNTER — Ambulatory Visit (INDEPENDENT_AMBULATORY_CARE_PROVIDER_SITE_OTHER)

## 2024-02-13 DIAGNOSIS — R4701 Aphasia: Secondary | ICD-10-CM

## 2024-02-13 DIAGNOSIS — R4189 Other symptoms and signs involving cognitive functions and awareness: Secondary | ICD-10-CM

## 2024-02-14 ENCOUNTER — Ambulatory Visit: Payer: Self-pay | Admitting: Family Medicine

## 2024-02-23 DIAGNOSIS — G2581 Restless legs syndrome: Secondary | ICD-10-CM | POA: Diagnosis not present

## 2024-02-23 DIAGNOSIS — Z79899 Other long term (current) drug therapy: Secondary | ICD-10-CM | POA: Diagnosis not present

## 2024-02-23 DIAGNOSIS — Z760 Encounter for issue of repeat prescription: Secondary | ICD-10-CM | POA: Diagnosis not present

## 2024-02-23 DIAGNOSIS — G894 Chronic pain syndrome: Secondary | ICD-10-CM | POA: Diagnosis not present

## 2024-02-23 DIAGNOSIS — Z79891 Long term (current) use of opiate analgesic: Secondary | ICD-10-CM | POA: Diagnosis not present

## 2024-02-24 ENCOUNTER — Encounter: Payer: Self-pay | Admitting: Physician Assistant

## 2024-02-24 ENCOUNTER — Other Ambulatory Visit: Payer: Self-pay | Admitting: Family Medicine

## 2024-02-24 DIAGNOSIS — G3184 Mild cognitive impairment, so stated: Secondary | ICD-10-CM

## 2024-03-27 ENCOUNTER — Ambulatory Visit (INDEPENDENT_AMBULATORY_CARE_PROVIDER_SITE_OTHER): Payer: Medicare Other

## 2024-03-27 VITALS — BP 129/57 | HR 56 | Ht 60.0 in | Wt 115.0 lb

## 2024-03-27 DIAGNOSIS — Z780 Asymptomatic menopausal state: Secondary | ICD-10-CM

## 2024-03-27 DIAGNOSIS — Z0000 Encounter for general adult medical examination without abnormal findings: Secondary | ICD-10-CM | POA: Diagnosis not present

## 2024-03-27 DIAGNOSIS — Z13820 Encounter for screening for osteoporosis: Secondary | ICD-10-CM | POA: Diagnosis not present

## 2024-03-27 NOTE — Patient Instructions (Signed)
 Ms. Cobaugh,  Thank you for taking the time for your Medicare Wellness Visit. I appreciate your continued commitment to your health goals. Please review the care plan we discussed, and feel free to reach out if I can assist you further.  Please note that Annual Wellness Visits do not include a physical exam. Some assessments may be limited, especially if the visit was conducted virtually. If needed, we may recommend an in-person follow-up with your provider.  Ongoing Care Seeing your primary care provider every 3 to 6 months helps us  monitor your health and provide consistent, personalized care.   Referrals If a referral was made during today's visit and you haven't received any updates within two weeks, please contact the referred provider directly to check on the status.  Recommended Screenings:  Health Maintenance  Topic Date Due   DEXA scan (bone density measurement)  04/13/2023   Medicare Annual Wellness Visit  03/22/2024   COVID-19 Vaccine (6 - 2025-26 season) 03/17/2025*   DTaP/Tdap/Td vaccine (3 - Td or Tdap) 08/15/2024   Pneumococcal Vaccine for age over 43  Completed   Flu Shot  Completed   Hepatitis C Screening  Completed   Zoster (Shingles) Vaccine  Completed   Meningitis B Vaccine  Aged Out   Breast Cancer Screening  Discontinued   Colon Cancer Screening  Discontinued  *Topic was postponed. The date shown is not the original due date.       03/27/2024    8:48 AM  Advanced Directives  Does Patient Have a Medical Advance Directive? No  Would patient like information on creating a medical advance directive? No - Patient declined    Vision: Annual vision screenings are recommended for early detection of glaucoma, cataracts, and diabetic retinopathy. These exams can also reveal signs of chronic conditions such as diabetes and high blood pressure.  Dental: Annual dental screenings help detect early signs of oral cancer, gum disease, and other conditions linked to  overall health, including heart disease and diabetes.  Please see the attached documents for additional preventive care recommendations.

## 2024-03-27 NOTE — Progress Notes (Signed)
 Chief Complaint  Patient presents with   Medicare Wellness     Subjective:   Cynthia Adams is a 78 y.o. female who presents for a Medicare Annual Wellness Visit.  Allergies (verified) Amoxicillin , Lovaza  [omega-3-acid  ethyl esters (fish)], Macrobid  [nitrofurantoin ], Minocycline, Pantoprazole , Sulfa antibiotics, Amoxicillin -pot clavulanate, Lipitor [atorvastatin ], Pravastatin , and Rosuvastatin    History: Past Medical History:  Diagnosis Date   Cataract 2017   Cataract surgery in 01-2016 and 02-2016   GERD (gastroesophageal reflux disease)    History of iron deficiency 06/13/2014   Hyperlipidemia    Hypertension    Nerve pain    Seasonal allergies    Past Surgical History:  Procedure Laterality Date   ABDOMINAL HYSTERECTOMY     BREAST BIOPSY Right    BREAST CYST EXCISION Right    CATARACT EXTRACTION W/ INTRAOCULAR LENS  IMPLANT, BILATERAL Bilateral 01-2016, 02-2016   CATARACT EXTRACTION W/ INTRAOCULAR LENS & ANTERIOR VITRECTOMY Bilateral 01-29-2016, 02-19-2016   Right eye in 01-2016 and Left eye 02-2016   CERVICAL FUSION  02/11/2020   CESAREAN SECTION     CESAREAN SECTION     CHOLECYSTECTOMY     LUMBAR DISC SURGERY     TONSILLECTOMY     Family History  Problem Relation Age of Onset   Cancer Mother        breast   Dementia Mother    Hypertension Mother    Hyperlipidemia Mother    Breast cancer Mother    Dementia Sister    Hypertension Sister    Hyperlipidemia Sister    Hyperlipidemia Brother    Hypertension Brother    Hypertension Son    Dementia Maternal Aunt    Breast cancer Maternal Aunt    Cancer Paternal Uncle    Alcohol abuse Paternal Uncle    Stroke Maternal Grandmother    Heart disease Paternal Grandmother    Hyperlipidemia Sister    Hypertension Sister    Hyperlipidemia Sister    Hypertension Sister    Dementia Maternal Aunt    Breast cancer Maternal Aunt    Dementia Maternal Aunt    Breast cancer Maternal Aunt    Alcohol abuse Paternal Uncle     Social History   Occupational History   Occupation: print production planner    Comment: retired  Tobacco Use   Smoking status: Never   Smokeless tobacco: Never  Vaping Use   Vaping status: Never Used  Substance and Sexual Activity   Alcohol use: Not Currently    Comment: .   Drug use: Never   Sexual activity: Not Currently   Tobacco Counseling Counseling given: Not Answered  SDOH Screenings   Food Insecurity: No Food Insecurity (03/27/2024)  Housing: Low Risk  (03/27/2024)  Transportation Needs: No Transportation Needs (03/27/2024)  Utilities: Not At Risk (03/27/2024)  Alcohol Screen: Low Risk  (03/23/2023)  Depression (PHQ2-9): Low Risk  (03/27/2024)  Financial Resource Strain: Low Risk  (03/23/2023)  Physical Activity: Inactive (03/27/2024)  Social Connections: Moderately Isolated (03/27/2024)  Stress: No Stress Concern Present (03/27/2024)  Tobacco Use: Low Risk  (03/27/2024)  Health Literacy: Adequate Health Literacy (03/27/2024)   See flowsheets for full screening details  Depression Screen PHQ 2 & 9 Depression Scale- Over the past 2 weeks, how often have you been bothered by any of the following problems? Little interest or pleasure in doing things: 0 Feeling down, depressed, or hopeless (PHQ Adolescent also includes...irritable): 0 PHQ-2 Total Score: 0     Goals Addressed  This Visit's Progress    Patient Stated       Patient states she would like to have more energy this year.        Visit info / Clinical Intake: Medicare Wellness Visit Type:: Subsequent Annual Wellness Visit Persons participating in visit:: patient Medicare Wellness Visit Mode:: In-person (required for WTM) Information given by:: patient Interpreter Needed?: No Pre-visit prep was completed: yes AWV questionnaire completed by patient prior to visit?: no Living arrangements:: lives with spouse/significant other Patient's Overall Health Status Rating: good Typical amount  of pain: some Does pain affect daily life?: no Are you currently prescribed opioids?: (!) yes  Dietary Habits and Nutritional Risks How many meals a day?: 2 Eats fruit and vegetables daily?: yes Most meals are obtained by: eating out In the last 2 weeks, have you had any of the following?: none Diabetic:: no  Functional Status Activities of Daily Living (to include ambulation/medication): Independent Ambulation: Independent Medication Administration: Independent Home Management: Independent Manage your own finances?: yes Primary transportation is: driving Concerns about vision?: no *vision screening is required for WTM* Concerns about hearing?: no  Fall Screening Falls in the past year?: 0 Number of falls in past year: 0 Was there an injury with Fall?: 0 Fall Risk Category Calculator: 0 Patient Fall Risk Level: Low Fall Risk  Fall Risk Patient at Risk for Falls Due to: No Fall Risks Fall risk Follow up: Falls evaluation completed  Home and Transportation Safety: All rugs have non-skid backing?: yes All stairs or steps have railings?: yes Grab bars in the bathtub or shower?: yes Have non-skid surface in bathtub or shower?: yes Good home lighting?: yes Regular seat belt use?: yes Hospital stays in the last year:: no  Cognitive Assessment Difficulty concentrating, remembering, or making decisions? : yes Will 6CIT or Mini Cog be Completed: yes What year is it?: 0 points What month is it?: 0 points Give patient an address phrase to remember (5 components): [address] [place], [place] [address] About what time is it?: 0 points Count backwards from 20 to 1: 2 points Say the months of the year in reverse: 4 points Repeat the address phrase from earlier: 10 points 6 CIT Score: 16 points  Advance Directives (For Healthcare) Does Patient Have a Medical Advance Directive?: No Would patient like information on creating a medical advance directive?: No - Patient  declined  Reviewed/Updated  Reviewed/Updated: Medical History; Surgical History; Medications; Allergies; Care Teams; Patient Goals        Objective:    Today's Vitals   03/27/24 0835  BP: (!) 129/57  Pulse: (!) 56  SpO2: 100%  Weight: 115 lb (52.2 kg)  Height: 5' (1.524 m)   Body mass index is 22.46 kg/m.  Current Medications (verified) Outpatient Encounter Medications as of 03/27/2024  Medication Sig   gabapentin  (NEURONTIN ) 600 MG tablet TAKE 1 TABLET BY MOUTH IN THE  MORNING 1 TABLET BY MOUTH AT  LUNCH, AND 2 TABLETS BY MOUTH AT BEDTIME   gemfibrozil  (LOPID ) 600 MG tablet TAKE 1 TABLET BY MOUTH TWICE  DAILY BEFORE MEALS   HYDROcodone-acetaminophen  (NORCO) 10-325 MG tablet Take by mouth.   methenamine  (MANDELAMINE) 1 g tablet Take 1 tablet (1,000 mg total) by mouth 2 (two) times daily. Start after the prescribed antibiotic is completed   metoprolol  tartrate (LOPRESSOR ) 50 MG tablet TAKE 1 TABLET BY MOUTH TWICE  DAILY   estradiol  (ESTRACE ) 0.1 MG/GM vaginal cream Apply a 1 cm line of cream to your vaginal  opening using your finger 2 nights a week (Patient not taking: Reported on 10/05/2023)   estradiol  (ESTRACE ) 0.5 MG tablet TAKE ONE-HALF TABLET BY MOUTH  DAILY (Patient not taking: Reported on 10/05/2023)   No facility-administered encounter medications on file as of 03/27/2024.   Hearing/Vision screen No results found. Immunizations and Health Maintenance Health Maintenance  Topic Date Due   DEXA SCAN  04/13/2023   COVID-19 Vaccine (6 - 2025-26 season) 03/17/2025 (Originally 01/09/2024)   DTaP/Tdap/Td (3 - Td or Tdap) 08/15/2024   Medicare Annual Wellness (AWV)  03/27/2025   Pneumococcal Vaccine: 50+ Years  Completed   Influenza Vaccine  Completed   Hepatitis C Screening  Completed   Zoster Vaccines- Shingrix  Completed   Meningococcal B Vaccine  Aged Out   Mammogram  Discontinued   Colonoscopy  Discontinued        Assessment/Plan:  This is a routine wellness  examination for Caliana.  Patient Care Team: Alvia Bring, DO as PCP - General (Family Medicine) Matilda Senior, MD as Consulting Physician (Urology)  I have personally reviewed and noted the following in the patient's chart:   Medical and social history Use of alcohol, tobacco or illicit drugs  Current medications and supplements including opioid prescriptions. Functional ability and status Nutritional status Physical activity Advanced directives List of other physicians Hospitalizations, surgeries, and ER visits in previous 12 months Vitals Screenings to include cognitive, depression, and falls Referrals and appointments  Orders Placed This Encounter  Procedures   DG Bone Density    Standing Status:   Future    Expiration Date:   03/27/2025    Reason for Exam (SYMPTOM  OR DIAGNOSIS REQUIRED):   screening osteoporosis    Preferred imaging location?:   [facility]   In addition, I have reviewed and discussed with patient certain preventive protocols, quality metrics, and best practice recommendations. A written personalized care plan for preventive services as well as general preventive health recommendations were provided to patient.   Bonny Jon Mayor, CMA   03/27/2024   Return in 1 year (on 03/27/2025).  After Visit Summary: (In Person-Printed) AVS printed and given to the patient  Nurse Notes:   AYAAT JANSMA is a 78 y.o. female patient of Alvia Bring, DO who had a Medicare Annual Wellness Visit today. Aine is Retired and lives with their spouse. She has 2 children. she reports that she is socially active and does interact with friends/family regularly. She is minimally physically active and enjoys reading.

## 2024-04-04 ENCOUNTER — Ambulatory Visit: Admitting: Urology

## 2024-05-07 NOTE — Progress Notes (Signed)
 "  Dementia of unclear etiology, concern for vascular and Alzheimer's disease  Cynthia Adams is a very pleasant 78 y.o. year old RH female with a history of hypertension, hyperlipidemia, chronic pain syndrome, B12 deficiency seen today for evaluation of memory loss. MoCA today is 8/30.  Although etiology is unclear, most recent MRI shows significant chronic vascular changes but there is some degree of atrophy including mild hippocampal atrophy as well and in view of her strong family history of dementia possibly of Alzheimer's disease, this remains in the differential as well.  Patient is able to participate on ADLs but no longer drives.  Mood is stable.  Discussed initiating memantine 5 mg twice daily with goal of 10 mg twice daily if tolerated, in view of bradycardia, current pulse at 55, thus unable to start ACHI.  Family agrees  Start memantine 5 mg twice daily as directed, with goal of 10 mg twice daily if tolerated. Replenish B12 (386) Monitor the use of pain medications as this may affect her memory.  Follow-up with pain clinic Continue to control mood as per PCP Recommend good control of cardiovascular risk factors [place] up in 6 months  Discussed the use of AI scribe software for clinical note transcription with the patient, who gave verbal consent to proceed.   History of Present Illness Cynthia Adams is a 78 year old female who presents with memory  decline.  She has been experiencing memory changes for the past couple of years, with significant worsening over the last year. Both short-term and long-term memory are affected, including difficulty remembering new information, names, and conversations, as well as recognizing familiar people and places. She frequently repeats questions, such as asking multiple times about dinner preparations within a short period. Comprehension issues are present, with struggles to understand instructions or commands, especially outside her familiar  environment.  Occasional disorientation occurs at home, but she generally recognizes her house and neighborhood. There are no wandering tendencies. Her family notes personality changes, including increased defensiveness and irritability, particularly when she is aware of her cognitive difficulties. She experiences hallucinations, such as believing there is someone else living in the house.  Denies any history of seizures .   Her sleep is generally good, with no insomnia. There is no history of sleep apnea. She manages her medications and finances independently, with no reported difficulty such as forgetting doses or financial mismanagement. However, her daughter is often present during phone calls to prevent potential scams.  Her appetite has decreased, and she does not eat as much as she used to, often having minimal meals. She also reports not drinking enough water. She no longer cooks regularly and has not had any kitchen accidents.   She has chronic neck and lower back pain, for which she takes pain medications and has had past surgeries. She is followed by a pain clinic for these issues. She had a fall a couple of months ago but did not sustain a head injury. She is taking gabapentin  for neuropathy in her feet and has a history of low B12 levels, for which she was previously taking supplements.  There is strong family history of dementia, including her mother, maternal aunts, and younger sister. The specific type of dementia in the family is not known. She does not drive Pertinent available labs Oct 2025:  TSH 2050, B12 386, normal CBC and CMP    MRI brain, 02/2024  personally reviewed, remarkable for old lacunar infarcts on B hemispheres, moderate to  advanced small vessel disease, mild atrophy, no acute abnormalities.     Past Medical History:  Diagnosis Date   Cataract 2017   Cataract surgery in 01-2016 and 02-2016   GERD (gastroesophageal reflux disease)    History of iron deficiency  06/13/2014   Hyperlipidemia    Hypertension    Nerve pain    Seasonal allergies      Past Surgical History:  Procedure Laterality Date   ABDOMINAL HYSTERECTOMY     BREAST BIOPSY Right    BREAST CYST EXCISION Right    CATARACT EXTRACTION W/ INTRAOCULAR LENS  IMPLANT, BILATERAL Bilateral 01-2016, 02-2016   CATARACT EXTRACTION W/ INTRAOCULAR LENS & ANTERIOR VITRECTOMY Bilateral 01-29-2016, 02-19-2016   Right eye in 01-2016 and Left eye 02-2016   CERVICAL FUSION  02/11/2020   CESAREAN SECTION     CESAREAN SECTION     CHOLECYSTECTOMY     LUMBAR DISC SURGERY     TONSILLECTOMY       Allergies[1]  Current Outpatient Medications  Medication Instructions   estradiol  (ESTRACE ) 0.1 MG/GM vaginal cream Apply a 1 cm line of cream to your vaginal opening using your finger 2 nights a week   estradiol  (ESTRACE ) 0.25 mg, Oral, Daily   gabapentin  (NEURONTIN ) 600 MG tablet TAKE 1 TABLET BY MOUTH IN THE  MORNING 1 TABLET BY MOUTH AT  LUNCH, AND 2 TABLETS BY MOUTH AT BEDTIME   gemfibrozil  (LOPID ) 600 mg, Oral, 2 times daily before meals   HYDROcodone-acetaminophen  (NORCO) 10-325 MG tablet Take by mouth.   memantine (NAMENDA) 5 MG tablet Take 1 tablet (5 mg at night) for 2 weeks, then increase to 1 tablet (5 mg) twice a day   methenamine  (MANDELAMINE) 1,000 mg, Oral, 2 times daily, Start after the prescribed antibiotic is completed   metoprolol  tartrate (LOPRESSOR ) 50 mg, Oral, 2 times daily     VITALS:   Vitals:   05/11/24 0945 05/11/24 1002  BP:  (!) 131/58  Pulse:  (!) 55  Resp:  20  SpO2:  100%  Weight:  120 lb (54.4 kg)  Height: 5' (1.524 m)          05/11/2024   12:00 PM  Montreal Cognitive Assessment   Visuospatial/ Executive (0/5) 2  Naming (0/3) 0  Attention: Read list of digits (0/2) 2  Attention: Read list of letters (0/1) 1  Attention: Serial 7 subtraction starting at 100 (0/3) 0  Language: Repeat phrase (0/2) 0  Language : Fluency (0/1) 0  Abstraction (0/2) 0  Delayed  Recall (0/5) 0  Orientation (0/6) 2  Total 7  Adjusted Score (based on education) 8        No data to display           Neurological Exam    Orientation:  Alert and oriented to person, not to place and time. No aphasia or dysarthria. Fund of knowledge is reduced.  Recent and remote memory impaired.  Attention and concentration are decreased.  Unable to name objects and repeat phrases. Delayed recall 0/5 Cranial nerves: There is good facial symmetry. Extraocular muscles are intact and visual fields are full to confrontational testing. Speech is fluent and clear.  Soft-spoken.  No tongue deviation. Hearing is intact to conversational tone. Tone: Tone is good throughout. Abnormal movements: No tremors. No Asterixis. No Fasciculations Sensation: Sensation is intact to light touch. Vibration is intact at the bilateral big toe.  Coordination: The patient has no difficulty with RAM's or FNF bilaterally. Normal finger  to nose  Motor: Strength is 5/5 in the bilateral upper and lower extremities. There is no pronator drift. There are no fasciculations noted. DTR's: Deep tendon reflexes are 2/4 bilaterally. Gait and Station: The patient is able to ambulate with some difficulty due to chronic pain.  The patient is able to heel toe walk. Gait is cautious and narrow. The patient is able to ambulate in a tandem fashion.       Thank you for allowing us  the opportunity to participate in the care of this nice patient. Please do not hesitate to contact us  for any questions or concerns.   Total time spent on today's visit was 62 minutes dedicated to this patient today, preparing to see patient, examining the patient, ordering tests and/or medications and counseling the patient, documenting clinical information in the EHR or other health record, independently interpreting results and communicating results to the patient/family, discussing treatment and goals, answering patient's questions and coordinating  care.  Cc:  Alvia Bring, DO  Camie Sevin 05/11/2024 12:31 PM       [1]  Allergies Allergen Reactions   Amoxicillin     Lovaza  [Omega-3-Acid  Ethyl Esters (Fish)] Diarrhea   Macrobid  [Nitrofurantoin ]    Minocycline Diarrhea, Nausea And Vomiting and Other (See Comments)    Stomach pain     Pantoprazole  Swelling   Sulfa Antibiotics Swelling   Amoxicillin -Pot Clavulanate Diarrhea   Lipitor [Atorvastatin ] Other (See Comments)    Diarrhea and insomnia   Pravastatin  Diarrhea   Rosuvastatin  Swelling   "

## 2024-05-11 ENCOUNTER — Ambulatory Visit

## 2024-05-11 ENCOUNTER — Encounter: Payer: Self-pay | Admitting: Physician Assistant

## 2024-05-11 ENCOUNTER — Ambulatory Visit: Admitting: Physician Assistant

## 2024-05-11 VITALS — BP 131/58 | HR 55 | Resp 20 | Ht 60.0 in | Wt 120.0 lb

## 2024-05-11 DIAGNOSIS — F0150 Vascular dementia without behavioral disturbance: Secondary | ICD-10-CM | POA: Diagnosis not present

## 2024-05-11 DIAGNOSIS — G309 Alzheimer's disease, unspecified: Secondary | ICD-10-CM

## 2024-05-11 DIAGNOSIS — F0280 Dementia in other diseases classified elsewhere without behavioral disturbance: Secondary | ICD-10-CM | POA: Insufficient documentation

## 2024-05-11 MED ORDER — MEMANTINE HCL 5 MG PO TABS: ORAL_TABLET | ORAL | 3 refills | Status: AC

## 2024-05-11 NOTE — Patient Instructions (Addendum)
 It was a pleasure to see you today at our office.   Recommendations:   Follow up in 3 months  Replenish B12  Baby aspirin  daily Start Memantine 5mg  tablets.  Take 1 tablet at bedtime for 2 weeks, then 1 tablet twice daily.   Recommend visiting the website :  Dementia [facility] to better understand some behaviors related to memory loss.  For guidance regarding [facility] and if placement were needed at the facility, contact Social Worker tel: [phone]     [email or web address]   RECOMMENDATIONS FOR ALL PATIENTS WITH MEMORY PROBLEMS: 1. Continue to exercise (Recommend 30 minutes of walking everyday, or 3 hours every week) 2. Increase social interactions - continue going to [place] and enjoy social gatherings with friends and family 3. Eat healthy, avoid fried foods and eat more fruits and vegetables 4. Maintain adequate blood pressure, blood sugar, and blood cholesterol level. Reducing the risk of stroke and cardiovascular disease also helps promoting better memory. 5. Avoid stressful situations. Live a simple life and avoid aggravations. Organize your time and prepare for the next day in anticipation. 6. Sleep well, avoid any interruptions of sleep and avoid any distractions in the bedroom that may interfere with adequate sleep quality 7. Avoid sugar, avoid sweets as there is a strong link between excessive sugar intake, diabetes, and cognitive impairment We discussed the Mediterranean diet, which has been shown to help patients reduce the risk of progressive memory disorders and reduces cardiovascular risk. This includes eating fish, eat fruits and green leafy vegetables, nuts like almonds and hazelnuts, walnuts, and also use olive oil. Avoid fast foods and fried foods as much as possible. Avoid sweets and sugar as sugar use has been linked to worsening of memory function.  There is always a concern of gradual  progression of memory problems. If this is the case, then we may need to adjust level of care according to patient needs. Support, both to the patient and caregiver, should then be put into place.         FALL PRECAUTIONS: Be cautious when walking. Scan the area for obstacles that may increase the risk of trips and falls. When getting up in the mornings, sit up at the edge of the bed for a few minutes before getting out of bed. Consider elevating the bed at the head end to avoid drop of blood pressure when getting up. Walk always in a well-lit room (use night lights in the walls). Avoid area rugs or power cords from appliances in the middle of the walkways. Use a walker or a cane if necessary and consider physical therapy for balance exercise. Get your eyesight checked regularly.  FINANCIAL OVERSIGHT: Supervision, especially oversight when making financial decisions or transactions is also recommended.  HOME SAFETY: Consider the safety of the kitchen when operating appliances like stoves, microwave oven, and blender. Consider having supervision and share cooking responsibilities until no longer able to participate in those. Accidents with firearms and other hazards in the house should be identified and addressed as well.   ABILITY TO BE LEFT ALONE: If patient is unable to contact 911 operator, consider using LifeLine, or when the need is there, arrange for someone to stay with patients. Smoking is a fire hazard, consider supervision or cessation. Risk of wandering should be assessed by caregiver and if detected at any point, supervision and safe proof recommendations should be instituted.  MEDICATION SUPERVISION: Inability to self-administer medication needs to be constantly addressed. Implement a mechanism  to ensure safe administration of the medications.      Mediterranean Diet A Mediterranean diet refers to food and lifestyle choices that are based on the traditions of countries located on  the [place]. This way of eating has been shown to help prevent certain conditions and improve outcomes for people who have chronic diseases, like kidney disease and heart disease. What are tips for following this plan? Lifestyle  Cook and eat meals together with your family, when possible. Drink enough fluid to keep your urine clear or pale yellow. Be physically active every day. This includes: Aerobic exercise like running or swimming. Leisure activities like gardening, walking, or housework. Get 7-8 hours of sleep each night. If recommended by your health care provider, drink red wine in moderation. This means 1 glass a day for nonpregnant women and 2 glasses a day for men. A glass of wine equals 5 oz (150 mL). Reading food labels  Check the serving size of packaged foods. For foods such as rice and pasta, the serving size refers to the amount of cooked product, not dry. Check the total fat in packaged foods. Avoid foods that have saturated fat or trans fats. Check the ingredients list for added sugars, such as corn syrup. Shopping  At the grocery store, buy most of your food from the areas near the walls of the store. This includes: Fresh fruits and vegetables (produce). Grains, beans, nuts, and seeds. Some of these may be available in unpackaged forms or large amounts (in bulk). Fresh seafood. Poultry and eggs. Low-fat dairy products. Buy whole ingredients instead of prepackaged foods. Buy fresh fruits and vegetables in-season from local farmers markets. Buy frozen fruits and vegetables in resealable bags. If you do not have access to quality fresh seafood, buy precooked frozen shrimp or canned fish, such as tuna, salmon, or sardines. Buy small amounts of raw or cooked vegetables, salads, or olives from the deli or salad bar at your store. Stock your pantry so you always have certain foods on hand, such as olive oil, canned tuna, canned tomatoes, rice, pasta, and  beans. Cooking  Cook foods with extra-virgin olive oil instead of using butter or other vegetable oils. Have meat as a side dish, and have vegetables or grains as your main dish. This means having meat in small portions or adding small amounts of meat to foods like pasta or stew. Use beans or vegetables instead of meat in common dishes like chili or lasagna. Experiment with different cooking methods. Try roasting or broiling vegetables instead of steaming or sauteing them. Add frozen vegetables to soups, stews, pasta, or rice. Add nuts or seeds for added healthy fat at each meal. You can add these to yogurt, salads, or vegetable dishes. Marinate fish or vegetables using olive oil, lemon juice, garlic, and fresh herbs. Meal planning  Plan to eat 1 vegetarian meal one day each week. Try to work up to 2 vegetarian meals, if possible. Eat seafood 2 or more times a week. Have healthy snacks readily available, such as: Vegetable sticks with hummus. Greek yogurt. Fruit and nut trail mix. Eat balanced meals throughout the week. This includes: Fruit: 2-3 servings a day Vegetables: 4-5 servings a day Low-fat dairy: 2 servings a day Fish, poultry, or lean meat: 1 serving a day Beans and legumes: 2 or more servings a week Nuts and seeds: 1-2 servings a day Whole grains: 6-8 servings a day Extra-virgin olive oil: 3-4 servings a day Limit red meat and sweets  to only a few servings a month What are my food choices? Mediterranean diet Recommended Grains: Whole-grain pasta. Brown rice. Bulgar wheat. Polenta. Couscous. Whole-wheat bread. [place] Made[place]. Vegetables: Artichokes. Beets. Broccoli. Cabbage. Carrots. Eggplant. Green beans. Chard. Kale. Spinach. Onions. Leeks. Peas. Squash. Tomatoes. Peppers. Radishes. Fruits: Apples. Apricots. Avocado. Berries. Bananas. Cherries. Dates. Figs. Grapes. Lemons. Melon. Oranges. Peaches. Plums. Pomegranate. Meats and other protein foods: Beans. Almonds.  Sunflower seeds. Pine nuts. Peanuts. Cod. Salmon. Scallops. Shrimp. Tuna. Tilapia. Clams. Oysters. Eggs. Dairy: Low-fat milk. Cheese. Greek yogurt. Beverages: Water. Red wine. Herbal tea. Fats and oils: Extra virgin olive oil. Avocado oil. Grape seed oil. Sweets and desserts: Greek yogurt with honey. Baked apples. Poached pears. Trail mix. Seasoning and other foods: Basil. Cilantro. Coriander. Cumin. Mint. Parsley. Sage. Rosemary. Tarragon. Garlic. Oregano. Thyme. Pepper. Balsalmic vinegar. Tahini. Hummus. Tomato sauce. Olives. Mushrooms. Limit these Grains: Prepackaged pasta or rice dishes. Prepackaged cereal with added sugar. Vegetables: Deep fried potatoes (french fries). Fruits: Fruit canned in syrup. Meats and other protein foods: Beef. Pork. Lamb. Poultry with skin. Hot dogs. [place]. Dairy: Ice cream. Sour cream. Whole milk. Beverages: Juice. Sugar-sweetened soft drinks. Beer. Liquor and spirits. Fats and oils: Butter. Canola oil. Vegetable oil. Beef fat (tallow). Lard. Sweets and desserts: Cookies. Cakes. Pies. Candy. Seasoning and other foods: Mayonnaise. Premade sauces and marinades. The items listed may not be a complete list. Talk with your dietitian about what dietary choices are right for you. Summary The Mediterranean diet includes both food and lifestyle choices. Eat a variety of fresh fruits and vegetables, beans, nuts, seeds, and whole grains. Limit the amount of red meat and sweets that you eat. Talk with your health care provider about whether it is safe for you to drink red wine in moderation. This means 1 glass a day for nonpregnant women and 2 glasses a day for men. A glass of wine equals 5 oz (150 mL). This information is not intended to replace advice given to you by your health care provider. Make sure you discuss any questions you have with your health care provider. Document Released: 12/18/2015 Document Revised: 01/20/2016 Document Reviewed: 12/18/2015 Elsevier  Interactive Patient Education  2017 [employer].

## 2024-05-15 ENCOUNTER — Encounter (INDEPENDENT_AMBULATORY_CARE_PROVIDER_SITE_OTHER): Admitting: Family Medicine

## 2024-05-15 ENCOUNTER — Ambulatory Visit: Admitting: Family Medicine

## 2024-05-15 DIAGNOSIS — I10 Essential (primary) hypertension: Secondary | ICD-10-CM

## 2024-05-15 DIAGNOSIS — G309 Alzheimer's disease, unspecified: Secondary | ICD-10-CM | POA: Diagnosis not present

## 2024-05-15 DIAGNOSIS — F0150 Vascular dementia without behavioral disturbance: Secondary | ICD-10-CM | POA: Diagnosis not present

## 2024-05-15 DIAGNOSIS — F0280 Dementia in other diseases classified elsewhere without behavioral disturbance: Secondary | ICD-10-CM

## 2024-05-15 NOTE — Telephone Encounter (Signed)
 Please see the MyChart message reply(ies) for my assessment and plan.    This patient gave consent for this Medical Advice Message and is aware that it may result in a bill to [employer], as well as the possibility of receiving a bill for a co-payment or deductible. They are an established patient, but are not seeking medical advice exclusively about a problem treated during an in person or video visit in the last seven days. I did not recommend an in person or video visit within seven days of my reply.    I spent a total of 7 minutes cumulative time within 7 days through [employer].  Everrett Coombe, DO

## 2024-06-13 ENCOUNTER — Ambulatory Visit

## 2024-06-13 DIAGNOSIS — Z79818 Long term (current) use of other agents affecting estrogen receptors and estrogen levels: Secondary | ICD-10-CM | POA: Diagnosis not present

## 2024-06-13 DIAGNOSIS — Z7983 Long term (current) use of bisphosphonates: Secondary | ICD-10-CM

## 2024-06-13 DIAGNOSIS — Z13820 Encounter for screening for osteoporosis: Secondary | ICD-10-CM

## 2024-08-15 ENCOUNTER — Ambulatory Visit: Payer: Self-pay | Admitting: Physician Assistant

## 2025-03-28 ENCOUNTER — Ambulatory Visit
